# Patient Record
Sex: Female | Born: 1955 | Race: Black or African American | Hispanic: No | Marital: Married | Smoking: Former smoker
Health system: Southern US, Community
[De-identification: ages and names within clinical notes are randomized; demographics above are authoritative.]

## PROBLEM LIST (undated history)

## (undated) DIAGNOSIS — I351 Nonrheumatic aortic (valve) insufficiency: Secondary | ICD-10-CM

## (undated) DIAGNOSIS — K920 Hematemesis: Secondary | ICD-10-CM

## (undated) DIAGNOSIS — E785 Hyperlipidemia, unspecified: Secondary | ICD-10-CM

## (undated) DIAGNOSIS — N63 Unspecified lump in unspecified breast: Secondary | ICD-10-CM

## (undated) DIAGNOSIS — Z8041 Family history of malignant neoplasm of ovary: Secondary | ICD-10-CM

## (undated) DIAGNOSIS — M1990 Unspecified osteoarthritis, unspecified site: Secondary | ICD-10-CM

## (undated) DIAGNOSIS — N920 Excessive and frequent menstruation with regular cycle: Secondary | ICD-10-CM

## (undated) DIAGNOSIS — Z8042 Family history of malignant neoplasm of prostate: Secondary | ICD-10-CM

## (undated) DIAGNOSIS — J4489 Other specified chronic obstructive pulmonary disease: Secondary | ICD-10-CM

## (undated) DIAGNOSIS — I2510 Atherosclerotic heart disease of native coronary artery without angina pectoris: Secondary | ICD-10-CM

## (undated) DIAGNOSIS — I10 Essential (primary) hypertension: Secondary | ICD-10-CM

## (undated) DIAGNOSIS — J449 Chronic obstructive pulmonary disease, unspecified: Secondary | ICD-10-CM

## (undated) DIAGNOSIS — Z806 Family history of leukemia: Secondary | ICD-10-CM

## (undated) HISTORY — DX: Family history of malignant neoplasm of ovary: Z80.41

## (undated) HISTORY — DX: Essential (primary) hypertension: I10

## (undated) HISTORY — DX: Family history of malignant neoplasm of prostate: Z80.42

## (undated) HISTORY — DX: Nonrheumatic aortic (valve) insufficiency: I35.1

## (undated) HISTORY — DX: Family history of leukemia: Z80.6

## (undated) HISTORY — DX: Atherosclerotic heart disease of native coronary artery without angina pectoris: I25.10

## (undated) HISTORY — PX: OTHER SURGICAL HISTORY: SHX169

## (undated) HISTORY — DX: Hyperlipidemia, unspecified: E78.5

## (undated) HISTORY — DX: Other specified chronic obstructive pulmonary disease: J44.89

## (undated) HISTORY — DX: Excessive and frequent menstruation with regular cycle: N92.0

## (undated) HISTORY — DX: Chronic obstructive pulmonary disease, unspecified: J44.9

---
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
## 1998-01-23 ENCOUNTER — Encounter: Payer: Self-pay | Admitting: Emergency Medicine

## 1998-01-23 ENCOUNTER — Emergency Department (HOSPITAL_COMMUNITY): Admission: EM | Admit: 1998-01-23 | Discharge: 1998-01-23 | Payer: Self-pay | Admitting: Emergency Medicine

## 1998-01-25 ENCOUNTER — Ambulatory Visit (HOSPITAL_BASED_OUTPATIENT_CLINIC_OR_DEPARTMENT_OTHER): Admission: RE | Admit: 1998-01-25 | Discharge: 1998-01-25 | Payer: Self-pay | Admitting: Orthopedic Surgery

## 1998-11-16 ENCOUNTER — Emergency Department (HOSPITAL_COMMUNITY): Admission: EM | Admit: 1998-11-16 | Discharge: 1998-11-16 | Payer: Self-pay | Admitting: Emergency Medicine

## 1998-11-17 ENCOUNTER — Encounter: Payer: Self-pay | Admitting: Emergency Medicine

## 1998-11-17 ENCOUNTER — Emergency Department (HOSPITAL_COMMUNITY): Admission: EM | Admit: 1998-11-17 | Discharge: 1998-11-17 | Payer: Self-pay | Admitting: Emergency Medicine

## 1998-11-21 ENCOUNTER — Ambulatory Visit (HOSPITAL_COMMUNITY): Admission: RE | Admit: 1998-11-21 | Discharge: 1998-11-21 | Payer: Self-pay | Admitting: Internal Medicine

## 1998-11-21 ENCOUNTER — Encounter: Payer: Self-pay | Admitting: Internal Medicine

## 1999-02-19 HISTORY — PX: CORONARY ARTERY BYPASS GRAFT: SHX141

## 1999-09-10 ENCOUNTER — Encounter: Payer: Self-pay | Admitting: *Deleted

## 1999-09-10 ENCOUNTER — Inpatient Hospital Stay (HOSPITAL_COMMUNITY): Admission: EM | Admit: 1999-09-10 | Discharge: 1999-09-16 | Payer: Self-pay | Admitting: *Deleted

## 1999-09-11 ENCOUNTER — Encounter: Payer: Self-pay | Admitting: *Deleted

## 1999-09-12 ENCOUNTER — Encounter: Payer: Self-pay | Admitting: *Deleted

## 1999-09-13 ENCOUNTER — Encounter: Payer: Self-pay | Admitting: Surgery

## 1999-09-14 ENCOUNTER — Encounter: Payer: Self-pay | Admitting: Surgery

## 1999-10-16 ENCOUNTER — Encounter (HOSPITAL_COMMUNITY): Admission: RE | Admit: 1999-10-16 | Discharge: 2000-01-14 | Payer: Self-pay | Admitting: *Deleted

## 2000-10-13 ENCOUNTER — Other Ambulatory Visit: Admission: RE | Admit: 2000-10-13 | Discharge: 2000-10-13 | Payer: Self-pay | Admitting: Gynecology

## 2004-05-25 ENCOUNTER — Ambulatory Visit: Payer: Self-pay | Admitting: Internal Medicine

## 2005-03-12 ENCOUNTER — Ambulatory Visit: Payer: Self-pay | Admitting: Internal Medicine

## 2005-09-09 ENCOUNTER — Ambulatory Visit: Payer: Self-pay | Admitting: Internal Medicine

## 2005-10-10 ENCOUNTER — Ambulatory Visit: Payer: Self-pay | Admitting: Internal Medicine

## 2006-03-07 ENCOUNTER — Ambulatory Visit: Payer: Self-pay | Admitting: Internal Medicine

## 2006-03-07 LAB — CONVERTED CEMR LAB
ALT: 12 units/L (ref 0–40)
AST: 16 units/L (ref 0–37)
BUN: 7 mg/dL (ref 6–23)
Bilirubin Urine: NEGATIVE
CO2: 29 meq/L (ref 19–32)
Chloride: 107 meq/L (ref 96–112)
Creatinine, Ser: 1 mg/dL (ref 0.4–1.2)
Glucose, Bld: 95 mg/dL (ref 70–99)
HCT: 36.3 % (ref 36.0–46.0)
HDL: 44.9 mg/dL (ref >39.0)
Hemoglobin: 11.8 g/dL — ABNORMAL LOW (ref 12.0–15.0)
LDL Cholesterol: 112 mg/dL — ABNORMAL HIGH (ref 0–99)
MCHC: 32.6 g/dL (ref 30.0–36.0)
Monocytes Absolute: 0.7 10*3/uL (ref 0.2–0.7)
Monocytes Relative: 11.3 % — ABNORMAL HIGH (ref 3.0–11.0)
Platelets: 356 10*3/uL (ref 150–400)
Potassium: 3.9 meq/L (ref 3.5–5.1)
RBC: 4.1 M/uL (ref 3.87–5.11)
RDW: 12.1 % (ref 11.5–14.6)
Sodium: 141 meq/L (ref 135–145)
Specific Gravity, Urine: 1.025 (ref 1.000–1.03)
Total Bilirubin: 0.5 mg/dL (ref 0.3–1.2)
Total Protein: 7 g/dL (ref 6.0–8.3)
Triglycerides: 95 mg/dL (ref 0–149)
Urine Glucose: NEGATIVE mg/dL
pH: 5.5 (ref 5.0–8.0)

## 2006-03-26 ENCOUNTER — Ambulatory Visit: Payer: Self-pay | Admitting: Cardiology

## 2006-04-15 ENCOUNTER — Inpatient Hospital Stay (HOSPITAL_COMMUNITY): Admission: EM | Admit: 2006-04-15 | Discharge: 2006-04-16 | Payer: Self-pay | Admitting: Emergency Medicine

## 2006-04-15 ENCOUNTER — Ambulatory Visit: Payer: Self-pay | Admitting: Cardiology

## 2006-04-16 ENCOUNTER — Encounter: Payer: Self-pay | Admitting: Cardiology

## 2006-04-25 ENCOUNTER — Ambulatory Visit: Payer: Self-pay

## 2006-04-25 LAB — CONVERTED CEMR LAB
ALT: 20 units/L (ref 0–40)
Albumin: 2.7 g/dL — ABNORMAL LOW (ref 3.5–5.2)
Bilirubin, Direct: 0.1 mg/dL (ref 0.0–0.3)
LDL Cholesterol: 82 mg/dL (ref 0–99)

## 2006-04-29 ENCOUNTER — Ambulatory Visit: Payer: Self-pay | Admitting: Cardiology

## 2006-12-08 ENCOUNTER — Ambulatory Visit: Payer: Self-pay | Admitting: Cardiology

## 2007-01-20 ENCOUNTER — Other Ambulatory Visit: Admission: RE | Admit: 2007-01-20 | Discharge: 2007-01-20 | Payer: Self-pay | Admitting: Obstetrics and Gynecology

## 2007-01-22 ENCOUNTER — Encounter: Admission: RE | Admit: 2007-01-22 | Discharge: 2007-01-22 | Payer: Self-pay | Admitting: Obstetrics and Gynecology

## 2007-06-10 ENCOUNTER — Ambulatory Visit: Payer: Self-pay | Admitting: Cardiology

## 2007-06-10 LAB — CONVERTED CEMR LAB
AST: 19 units/L (ref 0–37)
Albumin: 3.4 g/dL — ABNORMAL LOW (ref 3.5–5.2)
Total CHOL/HDL Ratio: 3.5

## 2007-06-18 ENCOUNTER — Ambulatory Visit: Payer: Self-pay | Admitting: Internal Medicine

## 2007-06-18 DIAGNOSIS — J069 Acute upper respiratory infection, unspecified: Secondary | ICD-10-CM | POA: Insufficient documentation

## 2007-06-18 DIAGNOSIS — N920 Excessive and frequent menstruation with regular cycle: Secondary | ICD-10-CM

## 2007-06-19 DIAGNOSIS — J4489 Other specified chronic obstructive pulmonary disease: Secondary | ICD-10-CM | POA: Insufficient documentation

## 2007-06-19 DIAGNOSIS — E785 Hyperlipidemia, unspecified: Secondary | ICD-10-CM

## 2007-06-19 DIAGNOSIS — I10 Essential (primary) hypertension: Secondary | ICD-10-CM

## 2007-06-19 DIAGNOSIS — Z951 Presence of aortocoronary bypass graft: Secondary | ICD-10-CM

## 2007-06-19 DIAGNOSIS — J449 Chronic obstructive pulmonary disease, unspecified: Secondary | ICD-10-CM

## 2007-11-20 ENCOUNTER — Ambulatory Visit: Payer: Self-pay | Admitting: Internal Medicine

## 2007-11-20 DIAGNOSIS — R05 Cough: Secondary | ICD-10-CM

## 2007-12-01 ENCOUNTER — Ambulatory Visit: Payer: Self-pay | Admitting: Endocrinology

## 2007-12-01 ENCOUNTER — Encounter (INDEPENDENT_AMBULATORY_CARE_PROVIDER_SITE_OTHER): Payer: Self-pay | Admitting: *Deleted

## 2008-03-30 ENCOUNTER — Ambulatory Visit: Payer: Self-pay | Admitting: Cardiology

## 2008-04-22 ENCOUNTER — Ambulatory Visit: Payer: Self-pay

## 2008-04-22 ENCOUNTER — Ambulatory Visit: Payer: Self-pay | Admitting: Cardiology

## 2008-04-22 ENCOUNTER — Encounter: Payer: Self-pay | Admitting: Cardiology

## 2008-04-22 LAB — CONVERTED CEMR LAB
ALT: 16 units/L (ref 0–35)
Alkaline Phosphatase: 55 units/L (ref 39–117)
Cholesterol: 124 mg/dL (ref 0–200)
HDL: 45.4 mg/dL (ref >39.0)
Total Bilirubin: 0.8 mg/dL (ref 0.3–1.2)
Total CHOL/HDL Ratio: 2.7
VLDL: 13 mg/dL (ref 0–40)

## 2009-01-28 ENCOUNTER — Emergency Department (HOSPITAL_COMMUNITY): Admission: EM | Admit: 2009-01-28 | Discharge: 2009-01-29 | Payer: Self-pay | Admitting: Emergency Medicine

## 2010-05-22 LAB — COMPREHENSIVE METABOLIC PANEL
AST: 21 U/L (ref 0–37)
Albumin: 3.6 g/dL (ref 3.5–5.2)
Alkaline Phosphatase: 72 U/L (ref 39–117)
CO2: 27 mEq/L (ref 19–32)
Calcium: 9.7 mg/dL (ref 8.4–10.5)
Creatinine, Ser: 1.22 mg/dL — ABNORMAL HIGH (ref 0.4–1.2)
GFR calc non Af Amer: 46 mL/min — ABNORMAL LOW (ref >60)
Total Protein: 8 g/dL (ref 6.0–8.3)

## 2010-05-22 LAB — DIFFERENTIAL: Neutrophils Relative %: 79 % — ABNORMAL HIGH (ref 43–77)

## 2010-05-22 LAB — URINALYSIS, ROUTINE W REFLEX MICROSCOPIC
Bilirubin Urine: NEGATIVE
Specific Gravity, Urine: 1.014 (ref 1.005–1.030)

## 2010-05-22 LAB — CBC
Hemoglobin: 13.2 g/dL (ref 12.0–15.0)
MCHC: 33.6 g/dL (ref 30.0–36.0)
Platelets: 230 10*3/uL (ref 150–400)
RBC: 4.32 MIL/uL (ref 3.87–5.11)
RDW: 13.2 % (ref 11.5–15.5)
WBC: 14 10*3/uL — ABNORMAL HIGH (ref 4.0–10.5)

## 2010-05-22 LAB — URINE MICROSCOPIC-ADD ON

## 2010-07-03 NOTE — Assessment & Plan Note (Signed)
[facility]                            CARDIOLOGY OFFICE NOTE   HARMONY, SANDELL                    MRN:          [id number]  DATE:06/10/2007                            DOB:          December 23, 1955    Casey Huerta is a pleasant 55 year old female whom I have seen in the  past for coronary artery disease status post coronary bypass graft in  2001.  Her most recent Myoview was performed on April 25, 2006.  At that  time she was found to have ejection fraction of 63%.  There is no scar  ischemia.  Her most recent echocardiogram was performed on April 16, 2006.  Her ejection fraction was 50-55%.  There was mild aortic  insufficiency.  There is trivial tricuspid regurgitation.  Since I last  saw her she is having difficulties with menorrhagia.  She apparently has  seen her gynecologist, who has recommended hysterectomy secondary to  fibroids.  She would prefer not to do this and she is presently on  medication.  She has also had a chronic cough.  She states she does not  have a primary care physician.  She denies any dyspnea, chest pain,  palpitations, or syncope.  There is no pedal edema.   MEDICATIONS:  Include multivitamin daily, atenolol 25 mg daily, and  Pravachol 40 mg nightly.  She also takes aspirin daily.   PHYSICAL EXAM:  Today shows a blood pressure of 122/70 and pulse is 70.  She weighs 136 pounds.  HEENT:  Normal.  Neck is supple.  CHEST:  Clear.  CARDIOVASCULAR:  Regular rate.  ABDOMEN:  Exam shows no tenderness.  EXTREMITIES:  Show no edema.   Her electrocardiogram today shows a sinus rhythm at a rate of 70.  There  are nonspecific ST changes.   DIAGNOSES:  1. Coronary disease status post bypassing graft - [name]      appears to be doing well from a symptomatic standpoint.  She has      had no chest pain or shortness of breath and a Myoview      approximately a year ago was normal.  Will not pursue further      cardiac  workup.  If she does indeed require hysterectomy I think      she could proceed safely without further evaluation given the      above.  She will continue on her aspirin, beta blocker and statin.  2. Cough - we will arrange for her to have a primary care physician to      look into this further and her other primary medical needs.  3. Menorrhagia - this is managed per her OB/GYN.  4. Hyperemia - she will continue on Pravachol.  We will check lipids      and liver and adjust as indicated.  5. Hypertension - her blood pressure is well controlled on present      medications.  No history of emphysema.   We will see her back in 12 months.     Madolyn Frieze Jens Som, MD, [facility]  Electronically Signed  BSC/MedQ  DD: 06/10/2007  DT: 06/10/2007  Job #: [id number]

## 2010-07-03 NOTE — Assessment & Plan Note (Signed)
[facility]                            CARDIOLOGY OFFICE NOTE   Casey Huerta, Casey Huerta                    MRN:          [id number]  DATE:03/30/2008                            DOB:          June 04, 1955    Casey Huerta is a pleasant female who has a history of coronary artery  disease status post coronary bypassing graft in 2001.  Her last Myoview  in March 2008 showed an ejection fraction of 63% with no scar or  ischemia.  An echocardiogram on October 18, 1999, showed normal LV  function.  There was mild aortic insufficiency and mitral regurgitation.  Since I last saw her on June 10, 2007, she has done reasonably well.  She continues to have her cough.  She apparently has been seen by Dr.  Jonny Huerta for this.  Chest x-ray was performed on November 20, 2007 that showed  no active disease.  Pulmonary evaluation was recommended, but she  apparently had to change insurance coverage due to loss of employment by  her husband.  She is planning to see a pulmonologist soon.  She has not  had hemoptysis.  She has not had chest pain, shortness of breath, or  pedal edema.  There has been no syncope.   Her medications include:  1. Multivitamin daily.  2. Pravachol 40 mg p.o. daily.  3. Toprol 25 mg p.o. daily.  4. Aspirin 325 mg p.o. daily.   Her physical exam shows a blood pressure of 120/62 and her pulse is 63.  She weighs 136 pounds.  Her HEENT is normal.  Her neck is supple with no  bruits.  Her chest is clear.  Cardiovascular exam reveals a regular rate  and rhythm.  Abdominal exam shows no tenderness.  Extremities show no  edema.   Her electrocardiogram shows a sinus rhythm at a rate of 63.  The axis is  normal.  There are nonspecific ST changes.   DIAGNOSES:  1. Coronary artery disease status post coronary bypassing graft - [name] is doing well with no chest pain or shortness of breath.      We will continue with medical therapy including her  aspirin, beta-      blocker, and statin.  2. Cough - I have encouraged her to follow up with her primary care      physician concerning this issue.  3. Diastolic murmur on examination - we will plan to repeat her      echocardiogram to reassess her aortic insufficiency.  4. Menorrhagia - she continues to have problems with this, but she      does not want to proceed with a hysterectomy and she will follow up      with OB/GYN concerning this issue.  5. Hyperlipidemia - she will continue on her Pravachol, and we will      check lipids and liver to adjust with a goal LDL of less than 70.  6. Hypertension - her blood pressure is adequately controlled on her      present medications.  7. History of emphysema.  We will see her back in 12 months or sooner if necessary.  I encouraged  diet and exercise as well.  She does not smoke.     Madolyn Frieze Jens Som, MD, [facility]  Electronically Signed    BSC/MedQ  DD: 03/30/2008  DT: 03/31/2008  Job #: [id number]

## 2010-07-03 NOTE — Assessment & Plan Note (Signed)
[facility]                            CARDIOLOGY OFFICE NOTE   Casey Huerta, Casey Huerta                    MRN:          [id number]  DATE:12/08/2006                            DOB:          [birth year]    Casey Huerta is a very pleasant female who I follow for coronary artery  disease, status post coronary bypassing graft in 2001.  Her most recent  Myoview was performed on April 25, 2006.  Her ejection fraction was 63%.  There was no scar or ischemia.  Since she was last seen she is doing  reasonably well.  There is no dyspnea, chest pain, palpitations, syncope  or pedal edema.  She did discontinue her Zocor and her Toprol as she  felt it was causing myalgias.   Her present medications include aspirin daily as well as multivitamin.   Her physical exam today shows a blood pressure of 146/70 and her pulse  is 70.  She weighs 135 pounds.  HEENT:  Normal.  NECK:  Supple with no bruits.  CHEST:  Clear.  CARDIOVASCULAR:  A regular rate and rhythm.  ABDOMEN:  No tenderness.  EXTREMITIES:  No edema.   Her electrocardiogram today shows a sinus rhythm at a rate of 70.  The  axis is normal.  There are minor nonspecific ST changes.   DIAGNOSES:  1. Coronary artery disease, status post coronary bypassing graft.  [name] is doing well from a symptomatic standpoint with no chest      pain or shortness of breath.  Her recent Myoview was low-risk.  We      will continue with medical therapy including her aspirin and      multivitamin.  Of note, we will add Pravachol to see if she will      tolerate this and, if so, we will check lipids and liver in 6 weeks      and adjust as indicated.  I will also resume a beta blocker (will      give her atenolol 25 mg p.o. daily) for her elevated blood pressure      and her history of coronary disease.  2. Hyperlipidemia.  We will add a statin as described above.  3. Hypertension.  The atenolol should help with her  blood pressure.  4. History of mildly elevated liver functions, resolved on most recent      check.  5. History of emphysema.   We will see her back in 6 months.     Madolyn Frieze Jens Som, MD, [facility]  Electronically Signed    BSC/MedQ  DD: 12/08/2006  DT: 12/09/2006  Job #: [id number]

## 2010-07-06 NOTE — Discharge Summary (Signed)
NAMEMARCAYLA, Huerta             ACCOUNT NO.:  [id number]   MEDICAL RECORD NO.:  [id number]          PATIENT TYPE:  INP   LOCATION:  4713                         FACILITY:  [facility]   PHYSICIAN:  Madolyn Frieze. Jens Som, MD, FACCDATE OF BIRTH:  31-May-1955   DATE OF ADMISSION:  04/15/2006  DATE OF DISCHARGE:  04/16/2006                               DISCHARGE SUMMARY   CARDIOLOGIST:  Dr. Olga Millers.   PRIMARY CARE PHYSICIAN:  Dr. Oliver Barre.   REASON FOR ADMISSION:  Chest pain neck.   DISCHARGE DIAGNOSES:  1. Chest pain, etiology unclear.  2. Coronary disease status post coronary artery bypass graft in 2001      with a left internal mammary artery to left anterior descending      artery.  3. Good left ventricular function.  4. Elevated liver function tests, etiology unclear.  5. Hyperlipidemia.  6. Ex-smoker.  7. History of emphysema.  8. Status post arthroscopic knee surgery bilaterally.  9. History of cesarean section.  10.Family history of coronary artery disease.  11.Recent viral syndrome.  12.Mild aortic regurgitation.  13.Mild thickening mitral valve on echocardiogram with equivocal      prolapse.   HISTORY:  Casey Huerta is a 55 year old female patient with a history of  coronary disease status post CABG in 2001 who had recently seen Dr.  Jens Som with complaints of dyspnea.  She was set up for an outpatient  Myoview study for April 25, 2006.  She presented to the office on the day  of admission with complaints of viral-like syndrome.  She had some chest  pain as well that kept her up all night.  She was brought in for  observation for further evaluation.   HOSPITAL COURSE:  As noted above, she was brought in to [facility] for observation and further evaluation.  She ruled out for  myocardial infarction by enzymes.  Her amylase and lipase were both  normal.  Her AST was slightly elevated at 56 and ALT at 62.  Her  alkaline phosphatase was 134.  These were  noted to be normal on March 07, 2006.  Her D-dimer was elevated at 1.98.  She was sent for CT scan  to rule out pulmonary embolus.  This revealed no pulmonary emboli.  She  had mild left basilar atelectasis.  She had COPD and emphysema.  There  was mild cardiomegaly.  There was mild ectasia of the ascending aorta at  3.5 cm.  She was also sent for an abdominal ultrasound secondary to her  elevated LFTs.  This showed no acute findings.  She was noted to be in  stable condition after the above testing was completed.  Dr. Jens Som  felt that she was stable enough for discharge to home with plans for  outpatient stress testing and follow-up with him in the office.  The  patient will be taken off of her Crestor for now given her elevated  LFTs.  Her LFTs will be repeated in the next several weeks.  If they  remain elevated, then she will be referred for gastroenterology  evaluation.  She is discharged to home in stable condition.   LABORATORY AND ANCILLARY DATA:  White count 11,500, hemoglobin 11.9,  hematocrit 34.7, platelet count 459,000.  INR 1.1.  Sodium 135,  potassium 3.8, glucose 102, BUN 9, creatinine 1.08.  LFTs as noted  above.  Cardiac enzymes as noted above.  Chest x-ray:  Postoperative  changes related to CABG, right lower lobe calcified granuloma, no active  disease.  CT angiogram of the chest as noted above.  Abdominal  ultrasound as noted above.  A 2-D echocardiogram done April 16, 2006  secondary to murmur on exam revealing an EF of 50-55%.  No regional wall  motion abnormalities, mild aortic regurgitation, mild thickening of the  mitral valve with equivocal prolapse.  There was trivial mitral valve  regurgitation.   DISCHARGE MEDICATIONS:  1. Toprol XL 5 mg daily.  2. Aspirin 325 mg daily.  3. Multivitamin daily.  4. She has been advised to stay off Crestor for now.   She has been advised to return to work on April 21, 2006.   DIET:  Low-fat, low-sodium diet.    ACTIVITY:  She is to increase her activity slowly.   FOLLOW UP:  She has a stress Myoview scheduled for April 25, 2006 and  7:15 a.m.  She has been provided with instructions for that.  She also  has blood work scheduled for that day.  She will have follow-up with Dr.  Jens Som on April 29, 2006 at 4:30 p.m.  She should follow up with Dr.  Jonny Ruiz as scheduled.  Total physician PA time greater than 30 minutes.      Tereso Newcomer, PA-C      Madolyn Frieze. Jens Som, MD, [facility]  Electronically Signed    SW/MEDQ  D:  04/16/2006  T:  04/16/2006  Job:  [id number]   cc:   Madolyn Frieze. Jens Som, MD, [facility]  Corwin Levins, MD

## 2010-07-06 NOTE — Assessment & Plan Note (Signed)
[facility]                            CARDIOLOGY OFFICE NOTE   Casey Huerta, Casey Huerta                    MRN:          [id number]  DATE:03/26/2006                            DOB:          10/21/1955    Casey Huerta is a very pleasant 55 year old female with a past medical  history of coronary artery disease status post coronary artery bypass  graft, hyperlipidemia who we were asked to evaluate for dyspnea.  The  patient's cardiac history dates back to 2001 when she underwent cardiac  catheterization secondary to chest pain.  She was found to have a 90%  ostial LAD.  There was no other obstructive disease.  Her ejection  fraction was 45%.  She subsequently underwent coronary artery bypass  graft off-pump on September 08, 1999, by Evelene Croon, M.D.  Since that time  she has done reasonably well.  Her most recent nuclear study was  performed on March 11, 2002.  Her ejection fraction was 64%.  There  was no ischemia.  Note, she did have preoperative carotid Dopplers that  showed no obstructive disease.  She does describe dyspnea with more  moderate activities, but not with routine activities around the house.  There is no orthopnea, PND, pedal edema, palpitations, presyncope,  syncope, or exertional chest pain.   MEDICATIONS:  1. Crestor 20 mg p.o. daily.  2. Toprol 25 mg p.o. daily.  3. Aspirin 325 mg p.o. daily.  4. Multivitamin daily.   PAST MEDICAL HISTORY:  Significant for hyperlipidemia, but there is no  hypertension or diabetes mellitus by her report.  She has a history of  coronary artery disease as outlined in the HPI.  She has no other  medical problems noted.  She has had prior coronary artery bypass graft.  She has had a prior cesarean section.  She has also had arthroscopic  knee surgery bilaterally.  She also carries a diagnosis of COPD when  reviewing the previous chart.   SOCIAL HISTORY:  She does not smoke and has not for  approximately 8  years.  She does not consume alcohol.   FAMILY HISTORY:  Positive for coronary artery disease in her sister and  her mother.   REVIEW OF SYSTEMS:  She denies any headaches, fevers, or chills.  There  is no productive cough or hemoptysis.  There is no dysphagia,  odynophagia, melena, or hematochezia.  There is no dysuria, hematuria,  frequency, rash, or seizure activity.  There is no orthopnea, PND, or  pedal edema.  There is no claudication noted.  She does have problems  with menstrual cramps and also has some pain in her hands bilaterally  that she is seeing Dr. Jonny Ruiz for.  The remaining systems are negative.   PHYSICAL EXAMINATION:  VITAL SIGNS:  Blood pressure is 135/77, pulse is  65, she weighs 136 pounds.  GENERAL:  She is well-developed and well-nourished and in no acute  distress.  SKIN:  Warm and dry.  She does not appear to be depressed.  There is no  peripheral clubbing.  HEENT:  Unremarkable with normal eye lids.  NECK:  Supple with a normal upstroke bilaterally and cannot appreciate  bruits.  There is no jugular venous distention and no thyromegaly is  noted.  CHEST:  Clear to auscultation with normal expansion.  BACK:  Normal.  CARDIOVASCULAR:  Regular rate and rhythm with a normal S1 and S2.  There  is an S4.  I cannot appreciate an S3.  There are no murmurs noted.  Her  PMI is nondisplaced.  ABDOMEN:  Nontender, nondistended, positive bowel sounds, no  hepatosplenomegaly and no masses appreciated.  There is no abdominal  bruit.  Note, she does have previous sternotomy from her coronary artery  bypass graft.  She has 2+ femoral pulses bilaterally with no bruits.  EXTREMITIES:  No edema and I can palpate no cords.  She has 2+ dorsalis  pedis pulses bilaterally.  NEUROLOGY:  Grossly intact.   Her electrocardiogram shows sinus rhythm with nonspecific ST changes.   DIAGNOSIS:  1. Coronary artery disease, status post coronary artery bypass graft.  2.  Hyperlipidemia.  3. History of chronic obstructive pulmonary disease.   PLAN:  Ms. Blacksher is complaining of some dyspnea on exertion.  We will  plan to proceed with a Myoview for risk stratification.  If it shows  normal LV function and normal perfusion then we will continue with  medical therapy.  I will have her return for fasting lipids and liver  and we will adjust her medications with a goal LDL of less than 70.  We  discussed risk factor modification today including diet and exercise.  She has discontinued her tobacco use.  We will see her back in 12  months.     Madolyn Frieze Jens Som, MD, [facility]  Electronically Signed    BSC/MedQ  DD: 03/26/2006  DT: 03/26/2006  Job #: [id number]   cc:   Corwin Levins, MD

## 2010-07-06 NOTE — Assessment & Plan Note (Signed)
[facility]                            CARDIOLOGY OFFICE NOTE   RHETA, HEMMELGARN                    MRN:          [id number]  DATE:04/29/2006                            DOB:          May 28, 1955    Mrs. Plourde returns for followup today. She has a history of coronary  artery disease, status post coronary bypassing graft and most recently  admitted to [facility] with atypical chest pain and ruled out  for myocardial infarction with serial enzymes. She also had a viral type  syndrome. Note her liver functions were mildly elevated with an SGOT of  56, SGPT of 62, and an alkaline phosphatase of 134. Also her D-dimer was  elevated but a CT scan showed no pulmonary embolus although there was  COPD and emphysema. Following discharge she did have a Myoview performed  on April 25, 2006. Her ejection fraction was 63%. There was no scar or  ischemia and her septal wall motion was consistent with a prior CABG.  Since discharge, she denies any chest pain, abdominal pain, dyspnea, or  syncope. Her medications include;  1. Toprol 25 mg p.o. q.day.  2. Aspirin 325 mg p.o. q.day.  3. Multivitamin.   Her physical exam today shows a blood pressure of 137/78, and her pulse  is 76. She weighs 136 pounds.  CHEST: Clear.  CARDIOVASCULAR: Reveals a regular rate and rhythm.  EXTREMITIES: Show no edema.   DIAGNOSES:  1. Coronary artery disease, status post coronary artery bypassing      graft.  2. Recent atypical chest pain with negative nuclear study.  3. Recently elevated liver functions, now resolved.  4. Hyperlipidemia.  5. History of emphysema.   PLAN:  Mrs. Lapaglia is much improved since discharge. Her Myoview  showed no ischemia or infarction. We will therefore continue with  medical therapy. Note her liver functions were elevated when she was in  the hospital but she did have an abdominal ultrasound that was  unremarkable. This may have been  virally mediated. Note we had  discontinued her Crestor but we will resume a statin that she would  benefit from this longterm. I will begin Zocor 40 mg p.o. q.h.s. and we  will check lipids and liver in 6 weeks and adjust as indicated. We  discussed risk factor modification including diet and exercise. I will  see her back in 6 months.     Madolyn Frieze Jens Som, MD, [facility]  Electronically Signed    BSC/MedQ  DD: 04/29/2006  DT: 05/01/2006  Job #: [id number]

## 2010-07-06 NOTE — H&P (Signed)
[facility] ADMISSION   KAETLYN, NOA                    MRN:          [id number]  DATE:04/15/2006                            DOB:          [birth year]    PRIMARY CARE PHYSICIAN:  Corwin Levins, MD.   CHIEF COMPLAINT:  Chest pain.   HISTORY OF PRESENT ILLNESS:  This is a 55 year old African American  female patient with history of coronary artery disease, status post CABG  in 2001 by Dr. Laneta Simmers with a LIMA to the LAD.  She recently saw Dr.  Jens Som on March 26, 2006, when she was having some dyspnea on  exertion and he ordered stress Myoview which is to be performed on April 25, 2006.   Last week she had the flu and seemed to get over that but then started  vomiting yesterday.  Throughout the night she said she was up with chest  pain all night long and could not sleep.  She describes it as sharp,  shooting, stabbing pains and she just hurts to take deep breaths.  This  morning, it is also sharp-shooting.  She said before she had her bypass  surgery, her EKGs were normal and she is just concerned that this is  coming from her heart.  She denies any chest heaviness, pressure,  shortness of breath, dizziness or presyncope.  She is able to keep  liquids down and some fruit but she did vomit this morning.   ALLERGIES:  NO KNOWN DRUG ALLERGIES.  She is sensitive to generic Zocor.   CURRENT MEDICATIONS:  1. Crestor 20 mg daily.  2. Toprol XL 25 mg daily.  3. Ecotrin 325 mg daily.  4. Multivitamin daily.   PAST MEDICAL HISTORY:  1. Hyperlipidemia.  2. History of emphysema.  Quit smoking 10-12 years ago.  3. Arthroscopic knee surgery bilaterally.  4. Prior C. Section.   SOCIAL HISTORY:  She is married.  She has three children, six  grandchildren.  She quit smoking 10-12 years ago.   FAMILY HISTORY:  Her mother and sister both have bypass surgery.   REVIEW OF SYSTEMS:  She did have flu  recently with fever, chills and  aching.  This has resolved but she has had the vomiting for the past day  and a half.  CARDIOPULMONARY:  Please see HPI.   PHYSICAL EXAMINATION:  GENERAL APPEARANCE:  This is an anxious 50-year-  old African American female complaining of chest pain.  VITAL SIGNS:  Blood pressure 132/66, pulse 82, weight 133.  HEENT:  Head is normocephalic, atraumatic.  Extraocular movements  intact.  Pupils are equal, round, reactive to light and accommodation.  Nasal mucosa is moist.  Serosa without erythema or exudate.  NECK:  Without JVD, HJR, bruit or thyroid enlargement.  LUNGS:  Decreased breath sounds but clear anterior, posterior and  lateral.  CARDIOVASCULAR:  Regular rate and rhythm at 85 beats per minute.  Normal  S1 and S2.  Positive S4 with 1/6 systolic murmur at the left sternal  border.  ABDOMEN:  Soft without organomegaly, masses,  lesions or abnormal  tenderness.  EXTREMITIES:  Without clubbing, cyanosis, or edema.  She has good distal  pulses.  NEUROLOGIC:  Without focal deficit.   EKG:  Normal sinus rhythm, no acute change.   IMPRESSION:  1. Chest pain, rule out cardiac ischemia.  2. Coronary artery disease status post coronary artery bypass grafting      x1 in 2001 by Dr. Laneta Simmers off pump, left internal mammary artery to      the left anterior descending.  3. Hyperlipidemia.  4. Prior emphysema, quit smoking 10 years ago.  5. Family history of coronary artery disease.  6. Recent flu and gastrointestinal virus.   PLAN:  At this time we will admit the patient for 24-hour observation,  order an echo with her systolic murmur and history of mild AI as well as  mild MR back in 2001 and she is already scheduled for a stress Myoview  April 25, 2006, which we could move up if needed.      Jacolyn Reedy, PA-C  Electronically Signed      Rollene Rotunda, MD, [facility]  Electronically Signed   ML/MedQ  DD: 04/15/2006  DT: 04/15/2006  Job #: [id number]

## 2010-07-06 NOTE — Discharge Summary (Signed)
[place]. [facility]  Patient:    Casey Huerta, Casey Huerta                    MRN: [id number] Adm. Date:  [id number] Disc. Date: 09/16/99 Attending:  Veneda Melter Dictator:   Sherrie George, P.A. CC:         Alleen Borne, M.D.             Veneda Melter, M.D. LHC                           Discharge Summary  DATE OF BIRTH:  11-21-1955.  ADMITTING DIAGNOSIS:  Angina, rule out myocardial infarction.  DISCHARGE DIAGNOSES: 1. Single vessel 90% left anterior descending coronary artery stenosis with    unstable angina. 2. Emphysema by CT scan. 3. Postoperative anemia with hemoglobin up to 7.8.  PROCEDURES: 1. Cardiac catheterization September 11, 1999. 2. Off pump coronary artery bypass grafting x 1, September 12, 1999 by Dr. Alleen Borne.  BRIEF HISTORY:  The patient is a 55 year old black female who presented to the emergency room at [facility] with a one-week history of chest pain.  The pain was midsternal.  She said it was very intense 20/10. She denied nausea, vomiting, diaphoresis, shortness of breath, or radiation.  She was seen in evaluation by the cardiology service and she was noted to have some T-wave inversions.  She was admitted for observation to rule out myocardial infarction.  REVIEW OF SYSTEMS:  Noncontributory.  PAST MEDICAL HISTORY:  She denied any past medical history.  MEDICATIONS: 1. Prevacid 30 mg q.d. 2. Vicodin 1-2 p.o. q.4-6h. p.r.n.  SOCIAL HISTORY:  She denied a history of alcohol, tobacco, or drug use.  She is married and has children.  For further history and physical, please see the dictated note.  HOSPITAL COURSE:  The patient was admitted, placed on observation, and IV heparin.  CPKs were minimally positive.  Troponin was minimally positive. There was some concern at this point to be due from gallbladder disease.  She is subsequently recommended to undergo treadmill and ultimately underwent cardiac  catheterization on September 14, 1999.  This showed a 90% ostial stenosis of the left anterior descending coronary artery.  The circumflex, the RCA, and the left main were normal.  There was significant anteroapical wall motion abnormality.  The ejection fraction was 40-45% and no thrombus was seen.  The patient was referred to CVTS and Dr. Alleen Borne.  He noted the patient had continued chest discomfort, borderline CPKs and MBs, T-wave inversion.  Checked the lipase to rule out pancreatitis but agreed coronary artery bypass graft was the best choice for the patients treatment.  Doppler studies were obtained and there was no evidence of ICA stenosis. There was antegrade vertebral flow.  No evidence of obvious plaque.  The patient had palpable pulses bilaterally.  After reviewing the studies, he recommended coronary artery bypass grafting. This was performed on September 12, 1999 using the off pump technique.  Coronary artery bypass grafting was the left internal mammary artery to the LAD.  The patient tolerated the procedure well.  She was transferred to the intensive care unit and then was transferred to the floor.  The patient has made good progress.  She has had no arrhythmias.  She had some nausea which has resolved on September 15, 1999.  She has postoperative anemia with hemoglobin  down to 7.8.  She received one unit of packed cells.  She brought her hemoglobin up to 8.8 with a hematocrit of 24.9.  Platelets were 180,000. White count is 9.2.  Sodium is 140, potassium is 3.9, chloride is 108, CO2 is 27, BUN 7, creatinine 1, glucose is 95.  At this point, she is ambulating and tolerating p.o.s well.  If she continues without any problems, we will anticipate discharge home in the morning, September 16, 1999.  DISCHARGE MEDICATIONS: 1. Toprol XL 25 mg 1 q.d. 2. Zocor 20 mg p.o. h.s. 3. Ecotrin 325 mg 1 q.d. 4. Darvocet-N 100 1-2 p.o. q.4h. p.r.n. 5. Multivitamin with iron 1 q.d. 6. Prevacid 1  p.o. q.d. (preadmission medicine).  DISCHARGE INSTRUCTIONS:  She will return in two weeks to see Dr. Veneda Melter, three weeks to see Dr. Alleen Borne.  Her sutures and staples can be removed prior to discharge.  CONDITION ON DISCHARGE:  Improved. DD:  09/15/99 TD:  09/16/99 Job: [id number]

## 2010-07-06 NOTE — Op Note (Signed)
[place]. [facility]  Patient:    Casey Huerta, Casey Huerta                    MRN: [id number] Proc. Date: 09/12/99 Adm. Date:  [id number] Attending:  Veneda Melter                           Operative Report  PREOPERATIVE DIAGNOSIS:  Severe single vessel coronary artery disease with unstable angina.  POSTOPERATIVE DIAGNOSIS:  Severe single vessel coronary artery disease with unstable angina.  OPERATIVE PROCEDURE:  Median sternotomy, off pump coronary artery bypass graft surgery times one using a left internal mammary artery graft to the left anterior descending coronary artery.  ATTENDING SURGEON:  Dr. Evelene Croon.  ASSISTANT:  Dr. Tressie Stalker.  ANESTHESIA:  General endotracheal.  CLINICAL HISTORY:  This patient is a 55 year old black female with recent onset of unstable anginal symptoms.  She ruled out for myocardial infarction. Electrocardiogram showed anterolateral ST and T-wave changes.  Cardiac catheterization showed an ostial 90% LAD stenosis with some haziness.  There is no other coronary disease.  After review of the angiograms and examination of the patient, it was felt that coronary artery bypass surgery using a left internal mammary graft to the LAD would be the best treatment.  I discussed the operative procedure with her and her sisters, including the possibility of doing off-pump coronary artery bypass surgery, alternatives, benefits, and risks, including bleeding, possible blood transfusion, infection, stroke, myocardial infarction, and death.  They understood and agreed to proceed. PROCEDURE IN DETAIL:  The patient was taken to the operating room and placed on the table in the supine position.  After induction of general endotracheal anesthesia, a Foley catheter was placed in the bladder using sterile technique.  Then, the chest, abdomen, and both lower extremities were prepped and draped in the usual sterile manner.  The chest was entered  through a median sternotomy incision and the pericardium opened in the midline.  Examination of the heart showed good ventricular contractility.  The ascending aorta had no palpable plaques in it.  Then, the left internal mammary artery was harvested from the chest wall as a pedicle graft.  This was a medium caliber vessel with good blood flow through it.  Then, an opening was made in the left pericardium anterior to the phrenic nerve and the mammary artery passed through into the pericardium.  The off-pump coronary bypass retractor was placed.  A lap pad was placed behind the heart for support to expose the LAD artery.  The patient tolerated this well and remained hemodynamically stable.  The stabilization bar was then measured and Silastic tapes were placed around the site of anastomosis proximally and distally.  The stabilizer bar was put in place.  There was good stabilization of the area of anastomosis.  Then, an arteriotomy was made in the LAD.  The internal diameter was about 2 mm.  The Silastic tapes were pulled up tight and there was reasonable hemostasis.  Then, the distal anastomosis of the left mammary graft to the LAD was performed in an end-to-side manner using continuous 8-0 Prolene suture. The tapes were then relaxed and the clamp removed from the mammary pedicle. The anastomosis was hemostatic.  The mammary pedicle was tacked to the epicardium with 6-0 Prolene sutures.  The stabilization bar was then removed. The patient was given Protamine to reverse the Heparin effect.  There was good hemostasis.  Cardiac function appeared excellent with a cardiac output of 4 liters a minute.  The patient had no arrhythmias and remained completely hemodynamically stable.  Then, three chest tubes were placed with two in the posterior pericardium, one in the left pleural space and one in the anterior mediastinum.  The pericardium was reapproximated over the heart.  The sternum was  closed with #6 stainless steel wires.  The fascia was closed with continuous #1 Vicryl suture.  Subcutaneous tissue was closed using continuous #2-0 Vicryl and the skin with 3-0 Vicryl subcuticular closure.  The sponge, needle and instrument counts were correct according to the scrub nurse.  Dry sterile dressings were applied over the incision and around the chest tubes.  The patient remained hemodynamically stable and was transported to the SICU in guarded but stable condition. DD:  09/12/99 TD:  09/13/99 Job: [id number]

## 2010-07-06 NOTE — Procedures (Signed)
[name]  Patient:    Casey Huerta, Casey Huerta                    MRN: [id number] Proc. Date: 09/11/99 Adm. Date:  [id number] Attending:  Veneda Melter CC:         Dr. Chales Abrahams                           Procedure Report  PROCEDURE: Coronary angiography.  INDICATIONS FOR PROCEDURE: Significant chest pain with electrocardiogram changes.  CARDIOLOGIST: Noralyn Pick. Eden Emms, M.D.  FINDINGS: The left main coronary artery was normal.  The ostial left anterior descending artery had a 90% eccentric lesion.  This was documented in multiple views.  The mid and distal vessel were normal.  The circumflex coronary artery was nondominant and was normal.  The right coronary artery was dominant and was normal.  RAO ventriculography reveals significant anterior apical and inferior apical wall motion abnormalities.  Ejection fraction was in the 45% range.  There was no gradient across the aortic valve and no MR.  Aortic pressure was 109/61, LV pressure was 109/10.  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  Films were reviewed with Dr. Riley Kill.  The patients left anterior descending lesion is too close to the left main to intervene on and she will need an off-pump internal mammary artery to the left anterior descending for her best long-term prognosis.  I suspect that she has some stunned myocardium and hopefully this will improve after bypass. DD:  09/11/99 TD:  09/12/99 Job: [id number]

## 2010-10-23 ENCOUNTER — Other Ambulatory Visit: Payer: Self-pay | Admitting: Cardiology

## 2010-11-22 ENCOUNTER — Other Ambulatory Visit: Payer: Self-pay | Admitting: Cardiology

## 2011-01-01 ENCOUNTER — Other Ambulatory Visit: Payer: Self-pay | Admitting: Cardiology

## 2012-01-21 ENCOUNTER — Other Ambulatory Visit: Payer: Self-pay | Admitting: Cardiology

## 2012-02-28 ENCOUNTER — Other Ambulatory Visit: Payer: Self-pay | Admitting: Cardiology

## 2012-03-04 ENCOUNTER — Encounter: Payer: Self-pay | Admitting: Cardiology

## 2012-03-04 ENCOUNTER — Encounter: Payer: Self-pay | Admitting: *Deleted

## 2012-03-06 ENCOUNTER — Encounter: Payer: Self-pay | Admitting: Cardiology

## 2012-03-06 ENCOUNTER — Ambulatory Visit (INDEPENDENT_AMBULATORY_CARE_PROVIDER_SITE_OTHER): Payer: Managed Care, Other (non HMO) | Admitting: Cardiology

## 2012-03-06 VITALS — BP 130/62 | HR 57 | Resp 18 | Ht 62.0 in | Wt 140.0 lb

## 2012-03-06 DIAGNOSIS — I10 Essential (primary) hypertension: Secondary | ICD-10-CM

## 2012-03-06 DIAGNOSIS — E785 Hyperlipidemia, unspecified: Secondary | ICD-10-CM

## 2012-03-06 DIAGNOSIS — I2510 Atherosclerotic heart disease of native coronary artery without angina pectoris: Secondary | ICD-10-CM

## 2012-03-06 DIAGNOSIS — R079 Chest pain, unspecified: Secondary | ICD-10-CM | POA: Insufficient documentation

## 2012-03-06 DIAGNOSIS — I359 Nonrheumatic aortic valve disorder, unspecified: Secondary | ICD-10-CM

## 2012-03-06 DIAGNOSIS — I351 Nonrheumatic aortic (valve) insufficiency: Secondary | ICD-10-CM

## 2012-03-06 MED ORDER — ATORVASTATIN CALCIUM 80 MG PO TABS: 80.0000 mg | ORAL_TABLET | Freq: Every day | ORAL | Status: DC

## 2012-03-06 MED ORDER — PRAVASTATIN SODIUM 40 MG PO TABS: 40.0000 mg | ORAL_TABLET | Freq: Every evening | ORAL | Status: DC

## 2012-03-06 NOTE — Assessment & Plan Note (Signed)
Add lipitor 80 mg daily. Lipids and liver in 6 weeks.

## 2012-03-06 NOTE — Patient Instructions (Addendum)
Your physician wants you to follow-up in: ONE YEAR WITH DR Shelda Pal will receive a reminder letter in the mail two months in advance. If you don't receive a letter, please call our office to schedule the follow-up appointment.   Your physician has requested that you have en exercise stress myoview. For further information please visit [email or web address] Please follow instruction sheet, as given.   Your physician has requested that you have an echocardiogram. Echocardiography is a painless test that uses sound waves to create images of your heart. It provides your doctor with information about the size and shape of your heart and how well your heart's chambers and valves are working. This procedure takes approximately one hour. There are no restrictions for this procedure.   START LIPITOR 80 MG ONCE DAILY  Your physician recommends that you return for lab work in: 6 WEEKS FASTING  REFERRAL TO ANOTHER PRIMARY BESIDES DR Jonny Ruiz

## 2012-03-06 NOTE — Assessment & Plan Note (Signed)
Blood pressure controlled - Continue atenolol 

## 2012-03-06 NOTE — Assessment & Plan Note (Signed)
Repeat echocardiogram. 

## 2012-03-06 NOTE — Assessment & Plan Note (Signed)
Continue aspirin. Resume statin. 

## 2012-03-06 NOTE — Progress Notes (Signed)
   HPI: pleasant female for evaluation of coronary artery disease. She is status post coronary artery bypass and graft in 2001. Last Myoview in March of 2008 showed an ejection fraction of 63% and no ischemia. Abdominal ultrasound in February 2008 showed no aneurysm. Echocardiogram in March of 2010 showed normal LV function, mild to moderate aortic insufficiency and mild mitral regurgitation. I last saw her in February 2010. Since that time over the last several years she has had occasional chest pain. It is substernal lasting seconds. It is described as "pain". No radiation or associated symptoms. It is not exertional. She has not had dyspnea on exertion, orthopnea, pedal edema, syncope or exertional chest pain.  Current Outpatient Prescriptions  Medication Sig Dispense Refill  . aspirin 81 MG tablet Take 81 mg by mouth daily.      Marland Kitchen atenolol (TENORMIN) 25 MG tablet TAKE ONE TABLET BY MOUTH IN THE MORNING  30 tablet  0  . Multiple Vitamin (MULTIVITAMIN WITH MINERALS) TABS Take 1 tablet by mouth daily.         Past Medical History  Diagnosis Date  . HYPERLIPIDEMIA   . HYPERTENSION   . CORONARY ARTERY DISEASE   . COPD   . MENORRHAGIA   . Aortic insufficiency     No past surgical history on file.  History   Social History  . Marital Status: Married    Spouse Name: N/A    Number of Children: N/A  . Years of Education: N/A   Occupational History  . Not on file.   Social History Main Topics  . Smoking status: Never Smoker   . Smokeless tobacco: Not on file  . Alcohol Use: Not on file  . Drug Use: Not on file  . Sexually Active: Not on file   Other Topics Concern  . Not on file   Social History Narrative  . No narrative on file    ROS: knee arthralgias but no fevers or chills, productive cough, hemoptysis, dysphasia, odynophagia, melena, hematochezia, dysuria, hematuria, rash, seizure activity, orthopnea, PND, pedal edema, claudication. Remaining systems are  negative.  Physical Exam: Well-developed well-nourished in no acute distress.  Skin is warm and dry.  Patient does not appear to be depressed; no peripheral clubbing Back - normal HEENT is normal with normal eyelids Neck is supple. No bruits. No thyromegaly. Chest is clear to auscultation with normal expansion.  Cardiovascular exam is regular rate and rhythm. 2/6 diastolic murmur left sternal border. Abdominal exam nontender or distended. No masses palpated. Positive bowel sounds. No hepatosplenomegaly  2+ femoral pulses Extremities show no edema; 2+ DP neuro grossly intact  ECG sinus rhythm at a rate of 74. Anterior T wave inversion.

## 2012-03-06 NOTE — Assessment & Plan Note (Signed)
Symptoms atypical. Schedule myoview for risk stratification.

## 2012-03-17 ENCOUNTER — Encounter: Payer: Self-pay | Admitting: Cardiology

## 2012-03-23 ENCOUNTER — Encounter (HOSPITAL_COMMUNITY): Payer: Managed Care, Other (non HMO)

## 2012-03-27 ENCOUNTER — Other Ambulatory Visit (HOSPITAL_COMMUNITY): Payer: Managed Care, Other (non HMO)

## 2012-03-30 ENCOUNTER — Ambulatory Visit: Payer: Managed Care, Other (non HMO) | Admitting: Internal Medicine

## 2012-03-30 DIAGNOSIS — Z0289 Encounter for other administrative examinations: Secondary | ICD-10-CM

## 2012-04-13 ENCOUNTER — Other Ambulatory Visit: Payer: Managed Care, Other (non HMO)

## 2012-04-14 ENCOUNTER — Other Ambulatory Visit: Payer: Self-pay | Admitting: Cardiology

## 2012-06-12 ENCOUNTER — Other Ambulatory Visit: Payer: Self-pay | Admitting: Cardiology

## 2012-07-17 ENCOUNTER — Other Ambulatory Visit: Payer: Self-pay | Admitting: Cardiology

## 2012-08-17 ENCOUNTER — Other Ambulatory Visit: Payer: Self-pay | Admitting: Cardiology

## 2012-09-23 ENCOUNTER — Other Ambulatory Visit: Payer: Self-pay | Admitting: Cardiology

## 2012-11-05 ENCOUNTER — Other Ambulatory Visit: Payer: Self-pay | Admitting: Cardiology

## 2012-12-13 ENCOUNTER — Other Ambulatory Visit: Payer: Self-pay | Admitting: Cardiology

## 2013-02-16 ENCOUNTER — Other Ambulatory Visit: Payer: Self-pay | Admitting: Cardiology

## 2013-03-29 ENCOUNTER — Other Ambulatory Visit: Payer: Self-pay | Admitting: Cardiology

## 2013-10-11 ENCOUNTER — Ambulatory Visit (INDEPENDENT_AMBULATORY_CARE_PROVIDER_SITE_OTHER): Payer: Managed Care, Other (non HMO) | Admitting: Cardiology

## 2013-10-11 ENCOUNTER — Encounter: Payer: Self-pay | Admitting: Cardiology

## 2013-10-11 ENCOUNTER — Observation Stay (HOSPITAL_COMMUNITY)
Admission: AD | Admit: 2013-10-11 | Discharge: 2013-10-13 | Disposition: A | Discharge status: Home / Self Care | Payer: Managed Care, Other (non HMO) | Source: Ambulatory Visit | Attending: Cardiology | Admitting: Cardiology

## 2013-10-11 ENCOUNTER — Encounter (HOSPITAL_COMMUNITY): Payer: Self-pay | Admitting: Pharmacy Technician

## 2013-10-11 VITALS — BP 160/74 | HR 76 | Ht 62.0 in | Wt 145.0 lb

## 2013-10-11 DIAGNOSIS — I351 Nonrheumatic aortic (valve) insufficiency: Secondary | ICD-10-CM | POA: Diagnosis present

## 2013-10-11 DIAGNOSIS — Z23 Encounter for immunization: Secondary | ICD-10-CM | POA: Diagnosis not present

## 2013-10-11 DIAGNOSIS — R079 Chest pain, unspecified: Secondary | ICD-10-CM

## 2013-10-11 DIAGNOSIS — E785 Hyperlipidemia, unspecified: Secondary | ICD-10-CM | POA: Diagnosis present

## 2013-10-11 DIAGNOSIS — I2510 Atherosclerotic heart disease of native coronary artery without angina pectoris: Secondary | ICD-10-CM | POA: Diagnosis not present

## 2013-10-11 DIAGNOSIS — Z951 Presence of aortocoronary bypass graft: Secondary | ICD-10-CM | POA: Diagnosis present

## 2013-10-11 DIAGNOSIS — Z7982 Long term (current) use of aspirin: Secondary | ICD-10-CM | POA: Insufficient documentation

## 2013-10-11 DIAGNOSIS — I10 Essential (primary) hypertension: Secondary | ICD-10-CM | POA: Diagnosis present

## 2013-10-11 DIAGNOSIS — J4489 Other specified chronic obstructive pulmonary disease: Secondary | ICD-10-CM | POA: Diagnosis present

## 2013-10-11 DIAGNOSIS — J449 Chronic obstructive pulmonary disease, unspecified: Secondary | ICD-10-CM | POA: Diagnosis present

## 2013-10-11 DIAGNOSIS — I359 Nonrheumatic aortic valve disorder, unspecified: Secondary | ICD-10-CM | POA: Insufficient documentation

## 2013-10-11 DIAGNOSIS — Z87891 Personal history of nicotine dependence: Secondary | ICD-10-CM | POA: Insufficient documentation

## 2013-10-11 LAB — CBC WITH DIFFERENTIAL/PLATELET
BASOS ABS: 0 10*3/uL (ref 0.0–0.1)
Basophils Relative: 1 % (ref 0–1)
EOS PCT: 3 % (ref 0–5)
Eosinophils Absolute: 0.2 10*3/uL (ref 0.0–0.7)
HEMATOCRIT: 37 % (ref 36.0–46.0)
Hemoglobin: 12.4 g/dL (ref 12.0–15.0)
LYMPHS ABS: 2.2 10*3/uL (ref 0.7–4.0)
LYMPHS PCT: 40 % (ref 12–46)
MCH: 29.7 pg (ref 26.0–34.0)
MCHC: 33.5 g/dL (ref 30.0–36.0)
MCV: 88.5 fL (ref 78.0–100.0)
Monocytes Absolute: 0.6 10*3/uL (ref 0.1–1.0)
Monocytes Relative: 11 % (ref 3–12)
NEUTROS ABS: 2.5 10*3/uL (ref 1.7–7.7)
Neutrophils Relative %: 45 % (ref 43–77)
Platelets: 217 10*3/uL (ref 150–400)
RBC: 4.18 MIL/uL (ref 3.87–5.11)
RDW: 12.1 % (ref 11.5–15.5)
WBC: 5.6 10*3/uL (ref 4.0–10.5)

## 2013-10-11 LAB — COMPREHENSIVE METABOLIC PANEL
ALBUMIN: 3.4 g/dL — AB (ref 3.5–5.2)
ALK PHOS: 77 U/L (ref 39–117)
ALT: 16 U/L (ref 0–35)
AST: 18 U/L (ref 0–37)
Anion gap: 12 (ref 5–15)
BUN: 13 mg/dL (ref 6–23)
CO2: 25 mEq/L (ref 19–32)
Calcium: 9.3 mg/dL (ref 8.4–10.5)
Chloride: 104 mEq/L (ref 96–112)
Creatinine, Ser: 1.06 mg/dL (ref 0.50–1.10)
GFR calc Af Amer: 66 mL/min — ABNORMAL LOW (ref >90)
GFR calc non Af Amer: 57 mL/min — ABNORMAL LOW (ref >90)
Glucose, Bld: 141 mg/dL — ABNORMAL HIGH (ref 70–99)
POTASSIUM: 3.5 meq/L — AB (ref 3.7–5.3)
Sodium: 141 mEq/L (ref 137–147)
TOTAL PROTEIN: 6.8 g/dL (ref 6.0–8.3)
Total Bilirubin: 0.2 mg/dL — ABNORMAL LOW (ref 0.3–1.2)

## 2013-10-11 MED ORDER — SODIUM CHLORIDE 0.9 % IV SOLN: 250.0000 mL | INTRAVENOUS | Status: DC | PRN

## 2013-10-11 MED ORDER — ASPIRIN 81 MG PO CHEW: 324.0000 mg | CHEWABLE_TABLET | ORAL | Status: AC

## 2013-10-11 MED ORDER — NITROGLYCERIN 0.4 MG SL SUBL
0.4000 mg | SUBLINGUAL_TABLET | SUBLINGUAL | Status: DC | PRN
Start: 2013-10-11 — End: 2013-10-13
  Administered 2013-10-11 (×2): 0.4 mg via SUBLINGUAL

## 2013-10-11 MED ORDER — SODIUM CHLORIDE 0.9 % IJ SOLN: 3.0000 mL | Freq: Two times a day (BID) | INTRAMUSCULAR | Status: DC

## 2013-10-11 MED ORDER — ACETAMINOPHEN 325 MG PO TABS
650.0000 mg | ORAL_TABLET | ORAL | Status: DC | PRN
  Administered 2013-10-11 – 2013-10-13 (×4): 650 mg via ORAL
  Filled 2013-10-11 (×4): qty 2

## 2013-10-11 MED ORDER — SODIUM CHLORIDE 0.9 % IV SOLN
INTRAVENOUS | Status: DC
  Administered 2013-10-12 (×2): via INTRAVENOUS

## 2013-10-11 MED ORDER — ASPIRIN 300 MG RE SUPP: 300.0000 mg | RECTAL | Status: AC

## 2013-10-11 MED ORDER — HEPARIN (PORCINE) IN NACL 100-0.45 UNIT/ML-% IJ SOLN
800.0000 [IU]/h | INTRAMUSCULAR | Status: DC
  Administered 2013-10-11: 750 [IU]/h via INTRAVENOUS
  Filled 2013-10-11 (×2): qty 250

## 2013-10-11 MED ORDER — CARVEDILOL 3.125 MG PO TABS
3.1250 mg | ORAL_TABLET | Freq: Two times a day (BID) | ORAL | Status: DC
  Administered 2013-10-11 – 2013-10-12 (×2): 3.125 mg via ORAL
  Filled 2013-10-11 (×4): qty 1

## 2013-10-11 MED ORDER — HEPARIN BOLUS VIA INFUSION
3000.0000 [IU] | Freq: Once | INTRAVENOUS | Status: AC
Start: 2013-10-11 — End: 2013-10-11
  Administered 2013-10-11: 3000 [IU] via INTRAVENOUS
  Filled 2013-10-11: qty 3000

## 2013-10-11 MED ORDER — ATORVASTATIN CALCIUM 40 MG PO TABS
40.0000 mg | ORAL_TABLET | Freq: Every day | ORAL | Status: DC
  Administered 2013-10-11 – 2013-10-12 (×2): 40 mg via ORAL
  Filled 2013-10-11 (×3): qty 1

## 2013-10-11 MED ORDER — ASPIRIN 81 MG PO CHEW
81.0000 mg | CHEWABLE_TABLET | ORAL | Status: AC
  Administered 2013-10-12: 81 mg via ORAL

## 2013-10-11 MED ORDER — SODIUM CHLORIDE 0.9 % IJ SOLN: 3.0000 mL | INTRAMUSCULAR | Status: DC | PRN

## 2013-10-11 MED ORDER — ONDANSETRON HCL 4 MG/2ML IJ SOLN
4.0000 mg | Freq: Four times a day (QID) | INTRAMUSCULAR | Status: DC | PRN
  Administered 2013-10-13: 4 mg via INTRAVENOUS
  Filled 2013-10-11: qty 2

## 2013-10-11 MED ORDER — NITROGLYCERIN 0.4 MG SL SUBL: 0.4000 mg | SUBLINGUAL_TABLET | SUBLINGUAL | Status: DC | PRN

## 2013-10-11 MED ORDER — ASPIRIN 81 MG PO CHEW
81.0000 mg | CHEWABLE_TABLET | Freq: Every day | ORAL | Status: DC
  Filled 2013-10-11: qty 1

## 2013-10-11 NOTE — Progress Notes (Signed)
10/11/2013 Casey Huerta   Jun 21, 1955  [id number]  Primary Physicia No PCP Per Patient Primary Cardiologist: Dr. Stanford Breed  HPI:  The patient is is a 58 year old AA female who presents to clinic today for routine evaluation. She is followed by Dr. Stanford Breed. She has a known history of CAD, status post CABG x1 with a LIMA to the LAD in 2001 by Dr. Cyndia Bent. Her last LHC was in March 2008 which showed an ejection fraction of 63% and no ischemia.  Her last office visit with Dr. Stanford Breed was in Jan 2013. At that time, he ordered for her to undergo repeat nuclear stress testing. He also instructed her to go on high dose statin therapy with Lipitor. Unfortunately she was lost to f/u. She states that due to financial issues, she was unable to return for her nuclear stress test and also was unable to keep her prescriptions filled. She has been w/o atenolol and Lipitor for over a year. She has been taking baby ASA daily.   She presents to clinic today with complaints of intermittent substernal chest discomfort often occuring at rest. Symptoms have been occuring for the last 6 months and have progressively worsened. She denies any associated dyspnea, dizziness, syncope or near syncope.  No exacerbating factors. She has not tried SL NTG at home, as symptoms typically resolve spontaneously after several minutes.   Today in clinic, she denies any chest discomfort but does note severe 10/10 left shoulder/neck discomfort radiating down the length of her entire arm. This just started today. It is a dull ache. She denies numbness and tingling. Her BP in clinic today was elevated at 160/70. She was given 2 SL NTG in clinic and her pain completely resolved after the second dose. Her EKG demonstrates NSR with no ischemic abnormalities.   Current Outpatient Prescriptions  Medication Sig Dispense Refill  . aspirin 81 MG tablet Take 81 mg by mouth daily.      . Multiple Vitamin (MULTIVITAMIN WITH MINERALS) TABS Take 1  tablet by mouth daily.       No current facility-administered medications for this visit.    Allergies  Allergen Reactions  . Simvastatin     REACTION: constipation    History   Social History  . Marital Status: Married    Spouse Name: N/A    Number of Children: N/A  . Years of Education: N/A   Occupational History  . Not on file.   Social History Main Topics  . Smoking status: Former Research scientist (life sciences)  . Smokeless tobacco: Never Used  . Alcohol Use: Yes     Comment: Occasional  . Drug Use: Not on file  . Sexual Activity: Not on file   Other Topics Concern  . Not on file   Social History Narrative  . No narrative on file     Review of Systems: General: negative for chills, fever, night sweats or weight changes.  Cardiovascular: negative for chest pain, dyspnea on exertion, edema, orthopnea, palpitations, paroxysmal nocturnal dyspnea or shortness of breath Dermatological: negative for rash Respiratory: negative for cough or wheezing Urologic: negative for hematuria Abdominal: negative for nausea, vomiting, diarrhea, bright red blood per rectum, melena, or hematemesis Neurologic: negative for visual changes, syncope, or dizziness All other systems reviewed and are otherwise negative except as noted above.    Blood pressure 160/74, pulse 76, height 5\' 2"  (1.575 m), weight 145 lb (65.772 kg).  General appearance: alert, cooperative and no distress Neck: no carotid bruit and no  JVD Lungs: clear to auscultation bilaterally Heart: regular rate and rhythm Extremities: no LEE Pulses: 2+ and symmetric Skin: warm and dry Neurologic: Grossly normal  EKG NSR 76 bpm. No ischemic changes  ASSESSMENT AND PLAN:   1. Unstable Angina: known h/o of CAD, s/p single vessel bypass in 2001 with a LIMA to LAD due to high grade LAD stenosis. She has h/o HTN and HLD that has been untreated for over a year. Her BP today is elevated at 160/70 and she presents with active left upper extremity  pain that is nitrate responsive. I discussed her case with Dr. [place]. We feel that it is best to admit to telemetry, cycle cardiac enzymes x 3 and plan for a diagnostic LHC to redefine her coronary anatomy as it has been over 14 years since her last catheterization. Will check a fasting lipid panel in the am as she has been off of statin therapy for over a year. Will restart statin and BB. Continue ASA. Will place on IV heparin per pharmacy.   PLAN  Admit to telemetry. R/O for MI. Plan for diagnostic LHC +/- PCI with Dr. Tamala Julian in the am. Restart cardiac meds. IV heparin per pharmacy. NPO at midnight.   SIMMONS, BRITTAINYPA-C 10/11/2013 2:42 PM

## 2013-10-11 NOTE — Progress Notes (Signed)
ANTICOAGULATION CONSULT NOTE - Initial Consult  Pharmacy Consult for Heparin Indication: chest pain/ACS  Allergies  Allergen Reactions  . Simvastatin Other (See Comments)    REACTION: constipation    Patient Measurements: Height: 5\' 2"  (157.5 cm) Weight: 145 lb (65.772 kg) IBW/kg (Calculated) : 50.1 Heparin Dosing Weight:   Vital Signs: Temp: 98.3 F (36.8 C) (08/24 1820) Temp src: Oral (08/24 1820) BP: 154/68 mmHg (08/24 1820) Pulse Rate: 82 (08/24 1820)  Labs: No results found for this basename: HGB, HCT, PLT, APTT, LABPROT, INR, HEPARINUNFRC, CREATININE, CKTOTAL, CKMB, TROPONINI,  in the last 72 hours  Estimated Creatinine Clearance: 45.3 ml/min (by C-G formula based on Cr of 1.22).   Medical History: Past Medical History  Diagnosis Date  . HYPERLIPIDEMIA   . HYPERTENSION   . CORONARY ARTERY DISEASE   . COPD   . MENORRHAGIA   . Aortic insufficiency     Medications:  Scheduled:  . aspirin  324 mg Oral NOW   Or  . aspirin  300 mg Rectal NOW  . [START ON 10/12/2013] aspirin  81 mg Oral Daily  . [START ON 10/12/2013] aspirin  81 mg Oral Pre-Cath  . atorvastatin  40 mg Oral q1800  . carvedilol  3.125 mg Oral BID WC  . sodium chloride  3 mL Intravenous Q12H    Assessment: 58yo female with chest pain and hx CAD with CABG x 1 in 2001 who has been lost to f/u.  She has been taking a BASA daily; to start heparin.  Baseline labs are pending.  Pt with no hx of bleeding problems.  Goal of Therapy:  Heparin level 0.3-0.7 units/ml Monitor platelets by anticoagulation protocol: Yes   Plan:  1-  Heparin 3000 units IV x 1, 750 units/hr 2-  Heparin level 6hr 3-  Daily HL and CBC  Gracy Bruins, [facility]

## 2013-10-11 NOTE — Patient Instructions (Signed)
You are being admitted to [facility] for Unstable Angina. You will go to the Admissions area where they will get you checked in then you will be admitted to [facility].  You will have your Cardiac Cath done tomorrow at some point.

## 2013-10-12 ENCOUNTER — Ambulatory Visit (HOSPITAL_COMMUNITY): Admit: 2013-10-12 | Payer: Self-pay | Admitting: Interventional Cardiology

## 2013-10-12 ENCOUNTER — Encounter (HOSPITAL_COMMUNITY)
Admission: AD | Disposition: A | Discharge status: Home / Self Care | Payer: Self-pay | Source: Ambulatory Visit | Attending: Cardiology

## 2013-10-12 ENCOUNTER — Encounter (HOSPITAL_COMMUNITY): Payer: Self-pay | Admitting: Physician Assistant

## 2013-10-12 ENCOUNTER — Observation Stay (HOSPITAL_COMMUNITY): Payer: Managed Care, Other (non HMO)

## 2013-10-12 DIAGNOSIS — R079 Chest pain, unspecified: Secondary | ICD-10-CM

## 2013-10-12 DIAGNOSIS — I2510 Atherosclerotic heart disease of native coronary artery without angina pectoris: Secondary | ICD-10-CM

## 2013-10-12 HISTORY — PX: LEFT HEART CATHETERIZATION WITH CORONARY/GRAFT ANGIOGRAM: SHX5450

## 2013-10-12 LAB — LIPID PANEL
CHOL/HDL RATIO: 3.2 ratio
Cholesterol: 171 mg/dL (ref 0–200)
HDL: 53 mg/dL (ref >39)
LDL Cholesterol: 70 mg/dL (ref 0–99)
TRIGLYCERIDES: 241 mg/dL — AB (ref <150)
VLDL: 48 mg/dL — ABNORMAL HIGH (ref 0–40)

## 2013-10-12 LAB — HEMOGLOBIN A1C
Hgb A1c MFr Bld: 5.9 % — ABNORMAL HIGH (ref <5.7)
MEAN PLASMA GLUCOSE: 123 mg/dL — AB (ref <117)

## 2013-10-12 LAB — BASIC METABOLIC PANEL
Anion gap: 11 (ref 5–15)
BUN: 14 mg/dL (ref 6–23)
CO2: 26 meq/L (ref 19–32)
Calcium: 9.4 mg/dL (ref 8.4–10.5)
Chloride: 104 mEq/L (ref 96–112)
Creatinine, Ser: 1.15 mg/dL — ABNORMAL HIGH (ref 0.50–1.10)
GFR calc Af Amer: 60 mL/min — ABNORMAL LOW (ref >90)
GFR, EST NON AFRICAN AMERICAN: 52 mL/min — AB (ref >90)
GLUCOSE: 101 mg/dL — AB (ref 70–99)
Potassium: 4.3 mEq/L (ref 3.7–5.3)
SODIUM: 141 meq/L (ref 137–147)

## 2013-10-12 LAB — CBC
HEMATOCRIT: 38.4 % (ref 36.0–46.0)
HEMOGLOBIN: 12.8 g/dL (ref 12.0–15.0)
MCH: 29.6 pg (ref 26.0–34.0)
MCHC: 33.3 g/dL (ref 30.0–36.0)
MCV: 88.9 fL (ref 78.0–100.0)
Platelets: 221 10*3/uL (ref 150–400)
RBC: 4.32 MIL/uL (ref 3.87–5.11)
RDW: 12 % (ref 11.5–15.5)
WBC: 5.2 10*3/uL (ref 4.0–10.5)

## 2013-10-12 LAB — POCT ACTIVATED CLOTTING TIME: Activated Clotting Time: 95 seconds

## 2013-10-12 LAB — PROTIME-INR
INR: 1.05 (ref 0.00–1.49)
PROTHROMBIN TIME: 13.7 s (ref 11.6–15.2)

## 2013-10-12 LAB — HEPARIN LEVEL (UNFRACTIONATED)
HEPARIN UNFRACTIONATED: 0.32 [IU]/mL (ref 0.30–0.70)
Heparin Unfractionated: 0.32 IU/mL (ref 0.30–0.70)

## 2013-10-12 SURGERY — LEFT HEART CATHETERIZATION WITH CORONARY/GRAFT ANGIOGRAM
Anesthesia: LOCAL

## 2013-10-12 MED ORDER — NITROGLYCERIN 1 MG/10 ML FOR IR/CATH LAB
INTRA_ARTERIAL | Status: AC
  Filled 2013-10-12: qty 10

## 2013-10-12 MED ORDER — MIDAZOLAM HCL 2 MG/2ML IJ SOLN
INTRAMUSCULAR | Status: AC
  Filled 2013-10-12: qty 2

## 2013-10-12 MED ORDER — ONDANSETRON HCL 4 MG/2ML IJ SOLN: 4.0000 mg | Freq: Four times a day (QID) | INTRAMUSCULAR | Status: DC | PRN

## 2013-10-12 MED ORDER — PNEUMOCOCCAL VAC POLYVALENT 25 MCG/0.5ML IJ INJ
0.5000 mL | INJECTION | INTRAMUSCULAR | Status: AC
  Administered 2013-10-13: 0.5 mL via INTRAMUSCULAR
  Filled 2013-10-12: qty 0.5

## 2013-10-12 MED ORDER — LIDOCAINE HCL (PF) 1 % IJ SOLN
INTRAMUSCULAR | Status: AC
  Filled 2013-10-12: qty 30

## 2013-10-12 MED ORDER — ACETAMINOPHEN 325 MG PO TABS: 650.0000 mg | ORAL_TABLET | ORAL | Status: DC | PRN

## 2013-10-12 MED ORDER — ASPIRIN EC 81 MG PO TBEC
81.0000 mg | DELAYED_RELEASE_TABLET | Freq: Every day | ORAL | Status: DC
  Administered 2013-10-13: 81 mg via ORAL
  Filled 2013-10-12: qty 1

## 2013-10-12 MED ORDER — HEPARIN (PORCINE) IN NACL 2-0.9 UNIT/ML-% IJ SOLN
INTRAMUSCULAR | Status: AC
  Filled 2013-10-12: qty 1000

## 2013-10-12 MED ORDER — FENTANYL CITRATE 0.05 MG/ML IJ SOLN
50.0000 ug | Freq: Once | INTRAMUSCULAR | Status: AC
  Administered 2013-10-12: 50 ug via INTRAVENOUS

## 2013-10-12 MED ORDER — FENTANYL CITRATE 0.05 MG/ML IJ SOLN
INTRAMUSCULAR | Status: AC
  Filled 2013-10-12: qty 2

## 2013-10-12 MED ORDER — TRAMADOL HCL 50 MG PO TABS
50.0000 mg | ORAL_TABLET | Freq: Four times a day (QID) | ORAL | Status: DC | PRN
  Administered 2013-10-12 – 2013-10-13 (×2): 50 mg via ORAL
  Filled 2013-10-12 (×2): qty 1

## 2013-10-12 MED ORDER — FENTANYL CITRATE 0.05 MG/ML IJ SOLN
INTRAMUSCULAR | Status: AC
  Administered 2013-10-12: 50 ug via INTRAVENOUS
  Filled 2013-10-12: qty 2

## 2013-10-12 MED ORDER — SODIUM CHLORIDE 0.9 % IV SOLN
INTRAVENOUS | Status: DC
  Administered 2013-10-12: 14:00:00 via INTRAVENOUS

## 2013-10-12 MED ORDER — CARVEDILOL 6.25 MG PO TABS
6.2500 mg | ORAL_TABLET | Freq: Two times a day (BID) | ORAL | Status: DC
  Administered 2013-10-12 – 2013-10-13 (×2): 6.25 mg via ORAL
  Filled 2013-10-12 (×4): qty 1

## 2013-10-12 NOTE — CV Procedure (Signed)
Casey Huerta is a 58 y.o. female    [id number]  [id number] LOCATION:  FACILITY: [place]  PHYSICIAN: Troy Sine, MD, [facility] 29-Jun-1955   DATE OF PROCEDURE:  10/12/2013    CARDIAC CATHETERIZATION     HISTORY:    Casey Huerta is a 58 y.o. female who underwent CABG revascularization surgery in 2001 with LIMA graft placed to the LAD done by Dr. Cyndia Bent.  She was admitted to the hospital yesterday with nitrate responsive chest pain worrisome for unstable angina.  She is referred for definitive cardiac catheterization.   PROCEDURE: Left heart catheterization: Coronary angiography of the native coronary arteries and left internal mammary artery, left ventriculography.  The patient was brought to the [facility] cardiac catherization labaratory in the postabsorptive state. She was premedicated with Versed 2 mg and fentanyl 50 mcg intravenously.  Her right groin was prepped and shaved in usual sterile fashion. Xylocaine 1% was used for local anesthesia. A 5 French sheath was inserted into the R femoral artery. Diagnostic catheterizatiion was done with 5 [place] FL4, FR4, LIMA and pigtail catheters. Left ventriculography was done with 25 cc Omnipaque contrast. Hemostasis was obtained by direct manual compression. The patient tolerated the procedure well.   HEMODYNAMICS:   Central Aorta: 155/65   Left Ventricle: 155/15  ANGIOGRAPHY:  Left main: Angiographically normal vessel, which bifurcated into the LAD and left circumflex coronary artery   LAD: Smooth 20% ostial narrowing.  The LAD was otherwise free of significant obstructive disease.  There was retrograde filling of the LIMA graft up to its proximal segment.  The distal LAD extended to and wrapped around the LV apex.  Left circumflex: Angiographically normal vessel which gave rise to 2 major marginal branches..   Right coronary artery: Normal vessel which supplied the posterior descending artery.  LIMA to LAD: This vessel  is atretic.  When injected selectively from the left subclavian.  The vessel was filled predominantly via retrograde filling from the native LAD.   Left ventriculography revealed normal left ventricular systolic function.  There were no segmental wall motion abnormalities.  There was no mitral regurgitation.   IMPRESSION:  Normal left ventricular function with an ejection fraction of approximately 55%.  No significant obstructive native coronary artery disease with mild smooth 20% ostial LAD narrowing.  Atretic LIMA graft arising from the subclavian with predominant retrograde filling of the graft via the LAD injection.  RECOMMENDATION:  Medical therapy.  Troy Sine, MD, [facility] 10/12/2013 2:03 PM

## 2013-10-12 NOTE — Progress Notes (Signed)
SUBJECTIVE:  She is still having some of the left arm pain that she was having.  Also with some right sided chest pain.   PHYSICAL EXAM Filed Vitals:   10/11/13 1820 10/11/13 2151 10/12/13 0500 10/12/13 0700  BP: 154/68 145/51 126/52 151/54  Pulse: 82 69 68 64  Temp: 98.3 F (36.8 C) 98.7 F (37.1 C) 98.1 F (36.7 C) 98.4 F (36.9 C)  TempSrc: Oral Oral Oral Oral  Resp: 20 18 18 16   Height: 5\' 2"  (1.575 m)     Weight: 145 lb (65.772 kg)  144 lb 13.5 oz (65.7 kg)   SpO2: 99% 96% 97% 97%   General:  No distress Lungs:  Clear Heart:  RRR Abdomen:  Positive bowel sounds, no rebound no guarding Extremities:  No edema   LABS:  Results for orders placed during the hospital encounter of 10/11/13 (from the past 24 hour(s))  COMPREHENSIVE METABOLIC PANEL     Status: Abnormal   Collection Time    10/11/13  8:34 PM      Result Value Ref Range   Sodium 141  137 - 147 mEq/L   Potassium 3.5 (*) 3.7 - 5.3 mEq/L   Chloride 104  96 - 112 mEq/L   CO2 25  19 - 32 mEq/L   Glucose, Bld 141 (*) 70 - 99 mg/dL   BUN 13  6 - 23 mg/dL   Creatinine, Ser 1.06  0.50 - 1.10 mg/dL   Calcium 9.3  8.4 - 10.5 mg/dL   Total Protein 6.8  6.0 - 8.3 g/dL   Albumin 3.4 (*) 3.5 - 5.2 g/dL   AST 18  0 - 37 U/L   ALT 16  0 - 35 U/L   Alkaline Phosphatase 77  39 - 117 U/L   Total Bilirubin 0.2 (*) 0.3 - 1.2 mg/dL   GFR calc non Af Amer 57 (*) >90 mL/min   GFR calc Af Amer 66 (*) >90 mL/min   Anion gap 12  5 - 15  HEMOGLOBIN A1C     Status: Abnormal   Collection Time    10/11/13  8:34 PM      Result Value Ref Range   Hemoglobin A1C 5.9 (*) <5.7 %   Mean Plasma Glucose 123 (*) <117 mg/dL  CBC WITH DIFFERENTIAL     Status: None   Collection Time    10/11/13  8:34 PM      Result Value Ref Range   WBC 5.6  4.0 - 10.5 K/uL   RBC 4.18  3.87 - 5.11 MIL/uL   Hemoglobin 12.4  12.0 - 15.0 g/dL   HCT 37.0  36.0 - 46.0 %   MCV 88.5  78.0 - 100.0 fL   MCH 29.7  26.0 - 34.0 pg   MCHC 33.5  30.0 - 36.0  g/dL   RDW 12.1  11.5 - 15.5 %   Platelets 217  150 - 400 K/uL   Neutrophils Relative % 45  43 - 77 %   Neutro Abs 2.5  1.7 - 7.7 K/uL   Lymphocytes Relative 40  12 - 46 %   Lymphs Abs 2.2  0.7 - 4.0 K/uL   Monocytes Relative 11  3 - 12 %   Monocytes Absolute 0.6  0.1 - 1.0 K/uL   Eosinophils Relative 3  0 - 5 %   Eosinophils Absolute 0.2  0.0 - 0.7 K/uL   Basophils Relative 1  0 - 1 %   Basophils  Absolute 0.0  0.0 - 0.1 K/uL  HEPARIN LEVEL (UNFRACTIONATED)     Status: None   Collection Time    10/12/13  1:10 AM      Result Value Ref Range   Heparin Unfractionated 0.32  0.30 - 0.70 IU/mL  CBC     Status: None   Collection Time    10/12/13  1:10 AM      Result Value Ref Range   WBC 5.2  4.0 - 10.5 K/uL   RBC 4.32  3.87 - 5.11 MIL/uL   Hemoglobin 12.8  12.0 - 15.0 g/dL   HCT 38.4  36.0 - 46.0 %   MCV 88.9  78.0 - 100.0 fL   MCH 29.6  26.0 - 34.0 pg   MCHC 33.3  30.0 - 36.0 g/dL   RDW 12.0  11.5 - 15.5 %   Platelets 221  150 - 400 K/uL  BASIC METABOLIC PANEL     Status: Abnormal   Collection Time    10/12/13  1:10 AM      Result Value Ref Range   Sodium 141  137 - 147 mEq/L   Potassium 4.3  3.7 - 5.3 mEq/L   Chloride 104  96 - 112 mEq/L   CO2 26  19 - 32 mEq/L   Glucose, Bld 101 (*) 70 - 99 mg/dL   BUN 14  6 - 23 mg/dL   Creatinine, Ser 1.15 (*) 0.50 - 1.10 mg/dL   Calcium 9.4  8.4 - 10.5 mg/dL   GFR calc non Af Amer 52 (*) >90 mL/min   GFR calc Af Amer 60 (*) >90 mL/min   Anion gap 11  5 - 15  PROTIME-INR     Status: None   Collection Time    10/12/13  1:10 AM      Result Value Ref Range   Prothrombin Time 13.7  11.6 - 15.2 seconds   INR 1.05  0.00 - 1.49  LIPID PANEL     Status: Abnormal   Collection Time    10/12/13  1:10 AM      Result Value Ref Range   Cholesterol 171  0 - 200 mg/dL   Triglycerides 241 (*) <150 mg/dL   HDL 53  >39 mg/dL   Total CHOL/HDL Ratio 3.2     VLDL 48 (*) 0 - 40 mg/dL   LDL Cholesterol 70  0 - 99 mg/dL  HEPARIN LEVEL  (UNFRACTIONATED)     Status: None   Collection Time    10/12/13  9:14 AM      Result Value Ref Range   Heparin Unfractionated 0.32  0.30 - 0.70 IU/mL    Intake/Output Summary (Last 24 hours) at 10/12/13 1155 Last data filed at 10/12/13 0500  Gross per 24 hour  Intake      0 ml  Output    300 ml  Net   -300 ml    ASSESSMENT AND PLAN:  CHEST PAIN:  History of CABG.  Cath today.    HTN:     I will increase her beta blocker.     Jeneen Rinks [facility] 10/12/2013 11:55 AM

## 2013-10-12 NOTE — Progress Notes (Signed)
ANTICOAGULATION CONSULT NOTE - Follow Up Consult  Pharmacy Consult for heparin Indication: USAP  Labs:  Recent Labs  10/11/13 2034 10/12/13 0110  HGB 12.4 12.8  HCT 37.0 38.4  PLT 217 221  LABPROT  --  13.7  INR  --  1.05  HEPARINUNFRC  --  0.32  CREATININE 1.06  --     Assessment: 58yo female therapeutic on heparin with initial dosing for USAP though at low end of goal and was drawn just 4.5hr from bolus so expect level to drop a little.  Goal of Therapy:  Heparin level 0.3-0.7 units/ml   Plan:  Will increase heparin gtt slightly to 800 units/hr and check level in [place], PharmD, BCPS  10/12/2013,2:37 AM

## 2013-10-12 NOTE — Progress Notes (Signed)
UR completed 

## 2013-10-12 NOTE — Interval H&P Note (Signed)
Cath Lab Visit (complete for each Cath Lab visit)  Clinical Evaluation Leading to the Procedure:   ACS: No.  Non-ACS:    Anginal Classification: CCS III  Anti-ischemic medical therapy: Maximal Therapy (2 or more classes of medications)  Non-Invasive Test Results: No non-invasive testing performed  Prior CABG: Previous CABG      History and Physical Interval Note:  10/12/2013 1:04 PM  [name]  has presented today for surgery, with the diagnosis of cp  The various methods of treatment have been discussed with the patient and family. After consideration of risks, benefits and other options for treatment, the patient has consented to  Procedure(s): LEFT HEART CATHETERIZATION WITH CORONARY/GRAFT ANGIOGRAM (N/A) as a surgical intervention .  The patient's history has been reviewed, patient examined, no change in status, stable for surgery.  I have reviewed the patient's chart and labs.  Questions were answered to the patient's satisfaction.     KELLY,THOMAS A

## 2013-10-12 NOTE — Progress Notes (Deleted)
Patient: Casey Huerta / Admit Date: 10/11/2013 / Date of Encounter: 10/12/2013, 9:05 AM   Subjective: Still w/ left arm pain though not as bad as 10/11/13. Has not received any further SL NTG. No chest pain, palpitations, SOB, or dyspnea. She is for cardiac cath today.    Objective: Telemetry: NSR, 60s Physical Exam: Blood pressure 151/54, pulse 64, temperature 98.4 F (36.9 C), temperature source Oral, resp. rate 16, height 5\' 2"  (1.575 m), weight 144 lb 13.5 oz (65.7 kg), SpO2 97.00%. General: Well developed, well nourished, in no acute distress. Head: Normocephalic, atraumatic, sclera non-icteric, no xanthomas, nares are without discharge. Neck: Negative for carotid bruits. JVP not elevated. Lungs: Clear bilaterally to auscultation without wheezes, rales, or rhonchi. Breathing is unlabored. Heart: RRR S1 S2 without murmurs, rubs, or gallops.  Abdomen: Soft, non-tender, non-distended with normoactive bowel sounds. No rebound/guarding. Extremities: No clubbing or cyanosis. No edema. Distal pedal pulses are 2+ and equal bilaterally. Neuro: Alert and oriented X 3. Moves all extremities spontaneously. Psych:  Responds to questions appropriately with a normal affect.   Intake/Output Summary (Last 24 hours) at 10/12/13 0905 Last data filed at 10/12/13 0500  Gross per 24 hour  Intake      0 ml  Output    300 ml  Net   -300 ml    Inpatient Medications:  . aspirin  324 mg Oral NOW   Or  . aspirin  300 mg Rectal NOW  . aspirin  81 mg Oral Daily  . atorvastatin  40 mg Oral q1800  . carvedilol  3.125 mg Oral BID WC  . sodium chloride  3 mL Intravenous Q12H   Infusions:  . sodium chloride 75 mL/hr at 10/12/13 0350  . heparin 800 Units/hr (10/12/13 0351)    Labs:  Recent Labs  10/11/13 2034 10/12/13 0110  NA 141 141  K 3.5* 4.3  CL 104 104  CO2 25 26  GLUCOSE 141* 101*  BUN 13 14  CREATININE 1.06 1.15*  CALCIUM 9.3 9.4    Recent Labs  10/11/13 2034  AST 18    ALT 16  ALKPHOS 77  BILITOT 0.2*  PROT 6.8  ALBUMIN 3.4*    Recent Labs  10/11/13 2034 10/12/13 0110  WBC 5.6 5.2  NEUTROABS 2.5  --   HGB 12.4 12.8  HCT 37.0 38.4  MCV 88.5 88.9  PLT 217 221   No results found for this basename: CKTOTAL, CKMB, TROPONINI,  in the last 72 hours No components found with this basename: POCBNP,   Recent Labs  10/11/13 2034  HGBA1C 5.9*     Radiology/Studies:  No results found.--> STAT CXR ordered.    Assessment and Plan  58 y/o F with h/o CAD s/p CABG x 1 w/ LIMA-->LAD (2001), HTN, HL, and COPD who presented to [facility] as a direct admit from the office 2/2 intermittent substernal chest pain occuring over the past 6 months that has progressively worsened. Symptoms often occur at rest. In the office on 8/24 she denied any chest discomfort, but did note severe 10/10 left shoulder/neck discomfort radiating down the length of her entire arm. This pain resolved resolved with 2 SL NTG in the office. Her EKG demonstrated NSR with no ischemic changes. She is for cardiac cath today. She continues to have left arm pain though not as bad as 8/24 when she was in the office. No chest pain.    1. Unstable angina: -She presented to the office on  8/24 with active left upper extremity pain that was nitrate responsive.  -Last LHC 14 years ago  -Myoview 2008 without ischemia, EF 63%  -Cardiac cath today-1200 -On aspirin, b-blocker, statin, heparin gtt  2. HTN -H/o uncontrolled HTN, improved from previous readings (160/70-->151/54) -On [place] 3/125 mg bid-titrate as needed  3. HL -Lipitor 40 mg -TC 171, LDL 70, HDL 53, trigs 241  Signed, Christell Faith, PA-C 10/12/2013 9:27 AM

## 2013-10-12 NOTE — H&P (Signed)
Patient seen and examined and history reviewed. Agree with above findings and plan. Patient presents with symptoms concerning for unstable angina. This may be related to noncompliance with medications. Will put in observation and cycle cardiac enzymes. Plan diagnostic cardiac cath in am.  Kolyn Rozario [place], [place] 10/12/2013 10:11 AM

## 2013-10-12 NOTE — H&P (View-Only) (Signed)
SUBJECTIVE:  She is still having some of the left arm pain that she was having.  Also with some right sided chest pain.   PHYSICAL EXAM Filed Vitals:   10/11/13 1820 10/11/13 2151 10/12/13 0500 10/12/13 0700  BP: 154/68 145/51 126/52 151/54  Pulse: 82 69 68 64  Temp: 98.3 F (36.8 C) 98.7 F (37.1 C) 98.1 F (36.7 C) 98.4 F (36.9 C)  TempSrc: Oral Oral Oral Oral  Resp: 20 18 18 16   Height: 5\' 2"  (1.575 m)     Weight: 145 lb (65.772 kg)  144 lb 13.5 oz (65.7 kg)   SpO2: 99% 96% 97% 97%   General:  No distress Lungs:  Clear Heart:  RRR Abdomen:  Positive bowel sounds, no rebound no guarding Extremities:  No edema   LABS:  Results for orders placed during the hospital encounter of 10/11/13 (from the past 24 hour(s))  COMPREHENSIVE METABOLIC PANEL     Status: Abnormal   Collection Time    10/11/13  8:34 PM      Result Value Ref Range   Sodium 141  137 - 147 mEq/L   Potassium 3.5 (*) 3.7 - 5.3 mEq/L   Chloride 104  96 - 112 mEq/L   CO2 25  19 - 32 mEq/L   Glucose, Bld 141 (*) 70 - 99 mg/dL   BUN 13  6 - 23 mg/dL   Creatinine, Ser 1.06  0.50 - 1.10 mg/dL   Calcium 9.3  8.4 - 10.5 mg/dL   Total Protein 6.8  6.0 - 8.3 g/dL   Albumin 3.4 (*) 3.5 - 5.2 g/dL   AST 18  0 - 37 U/L   ALT 16  0 - 35 U/L   Alkaline Phosphatase 77  39 - 117 U/L   Total Bilirubin 0.2 (*) 0.3 - 1.2 mg/dL   GFR calc non Af Amer 57 (*) >90 mL/min   GFR calc Af Amer 66 (*) >90 mL/min   Anion gap 12  5 - 15  HEMOGLOBIN A1C     Status: Abnormal   Collection Time    10/11/13  8:34 PM      Result Value Ref Range   Hemoglobin A1C 5.9 (*) <5.7 %   Mean Plasma Glucose 123 (*) <117 mg/dL  CBC WITH DIFFERENTIAL     Status: None   Collection Time    10/11/13  8:34 PM      Result Value Ref Range   WBC 5.6  4.0 - 10.5 K/uL   RBC 4.18  3.87 - 5.11 MIL/uL   Hemoglobin 12.4  12.0 - 15.0 g/dL   HCT 37.0  36.0 - 46.0 %   MCV 88.5  78.0 - 100.0 fL   MCH 29.7  26.0 - 34.0 pg   MCHC 33.5  30.0 - 36.0  g/dL   RDW 12.1  11.5 - 15.5 %   Platelets 217  150 - 400 K/uL   Neutrophils Relative % 45  43 - 77 %   Neutro Abs 2.5  1.7 - 7.7 K/uL   Lymphocytes Relative 40  12 - 46 %   Lymphs Abs 2.2  0.7 - 4.0 K/uL   Monocytes Relative 11  3 - 12 %   Monocytes Absolute 0.6  0.1 - 1.0 K/uL   Eosinophils Relative 3  0 - 5 %   Eosinophils Absolute 0.2  0.0 - 0.7 K/uL   Basophils Relative 1  0 - 1 %   Basophils  Absolute 0.0  0.0 - 0.1 K/uL  HEPARIN LEVEL (UNFRACTIONATED)     Status: None   Collection Time    10/12/13  1:10 AM      Result Value Ref Range   Heparin Unfractionated 0.32  0.30 - 0.70 IU/mL  CBC     Status: None   Collection Time    10/12/13  1:10 AM      Result Value Ref Range   WBC 5.2  4.0 - 10.5 K/uL   RBC 4.32  3.87 - 5.11 MIL/uL   Hemoglobin 12.8  12.0 - 15.0 g/dL   HCT 38.4  36.0 - 46.0 %   MCV 88.9  78.0 - 100.0 fL   MCH 29.6  26.0 - 34.0 pg   MCHC 33.3  30.0 - 36.0 g/dL   RDW 12.0  11.5 - 15.5 %   Platelets 221  150 - 400 K/uL  BASIC METABOLIC PANEL     Status: Abnormal   Collection Time    10/12/13  1:10 AM      Result Value Ref Range   Sodium 141  137 - 147 mEq/L   Potassium 4.3  3.7 - 5.3 mEq/L   Chloride 104  96 - 112 mEq/L   CO2 26  19 - 32 mEq/L   Glucose, Bld 101 (*) 70 - 99 mg/dL   BUN 14  6 - 23 mg/dL   Creatinine, Ser 1.15 (*) 0.50 - 1.10 mg/dL   Calcium 9.4  8.4 - 10.5 mg/dL   GFR calc non Af Amer 52 (*) >90 mL/min   GFR calc Af Amer 60 (*) >90 mL/min   Anion gap 11  5 - 15  PROTIME-INR     Status: None   Collection Time    10/12/13  1:10 AM      Result Value Ref Range   Prothrombin Time 13.7  11.6 - 15.2 seconds   INR 1.05  0.00 - 1.49  LIPID PANEL     Status: Abnormal   Collection Time    10/12/13  1:10 AM      Result Value Ref Range   Cholesterol 171  0 - 200 mg/dL   Triglycerides 241 (*) <150 mg/dL   HDL 53  >39 mg/dL   Total CHOL/HDL Ratio 3.2     VLDL 48 (*) 0 - 40 mg/dL   LDL Cholesterol 70  0 - 99 mg/dL  HEPARIN LEVEL  (UNFRACTIONATED)     Status: None   Collection Time    10/12/13  9:14 AM      Result Value Ref Range   Heparin Unfractionated 0.32  0.30 - 0.70 IU/mL    Intake/Output Summary (Last 24 hours) at 10/12/13 1155 Last data filed at 10/12/13 0500  Gross per 24 hour  Intake      0 ml  Output    300 ml  Net   -300 ml    ASSESSMENT AND PLAN:  CHEST PAIN:  History of CABG.  Cath today.    HTN:     I will increase her beta blocker.     Jeneen Rinks [facility] 10/12/2013 11:55 AM

## 2013-10-12 NOTE — H&P (Signed)
10/11/2013  Casey Huerta  1955-04-27  [id number]   Primary Physician: No PCP Per Patient  Primary Cardiologist: Dr. Stanford Breed   HPI: The patient is is a 58 year old AA female who presents to clinic today for routine evaluation. She is followed by Dr. Stanford Breed. She has a known history of CAD, status post CABG x1 with a LIMA to the LAD in 2001 by Dr. Cyndia Bent. Her last LHC was in March 2008 which showed an ejection fraction of 63% and no ischemia. Her last office visit with Dr. Stanford Breed was in Jan 2013. At that time, he ordered for her to undergo repeat nuclear stress testing. He also instructed her to go on high dose statin therapy with Lipitor. Unfortunately she was lost to f/u. She states that due to financial issues, she was unable to return for her nuclear stress test and also was unable to keep her prescriptions filled. She has been w/o atenolol and Lipitor for over a year. She has been taking baby ASA daily.   She presents to clinic today with complaints of intermittent substernal chest discomfort often occuring at rest. Symptoms have been occuring for the last 6 months and have progressively worsened. She denies any associated dyspnea, dizziness, syncope or near syncope. No exacerbating factors. She has not tried SL NTG at home, as symptoms typically resolve spontaneously after several minutes.   Today in clinic, she denies any chest discomfort but does note severe 10/10 left shoulder/neck discomfort radiating down the length of her entire arm. This just started today. It is a dull ache. She denies numbness and tingling. Her BP in clinic today was elevated at 160/70. She was given 2 SL NTG in clinic and her pain completely resolved after the second dose. Her EKG demonstrates NSR with no ischemic abnormalities.    Current Outpatient Prescriptions   Medication  Sig  Dispense  Refill   .  aspirin 81 MG tablet  Take 81 mg by mouth daily.     .  Multiple Vitamin (MULTIVITAMIN WITH MINERALS) TABS   Take 1 tablet by mouth daily.      No current facility-administered medications for this visit.    Allergies   Allergen  Reactions   .  Simvastatin      REACTION: constipation    History    Social History   .  Marital Status:  Married     Spouse Name:  N/A     Number of Children:  N/A   .  Years of Education:  N/A    Occupational History   .  Not on file.    Social History Main Topics   .  Smoking status:  Former Research scientist (life sciences)   .  Smokeless tobacco:  Never Used   .  Alcohol Use:  Yes      Comment: Occasional   .  Drug Use:  Not on file   .  Sexual Activity:  Not on file    Other Topics  Concern   .  Not on file    Social History Narrative   .  No narrative on file    Family History  Problem Relation Age of Onset  . Heart disease Mother   . Heart disease Sister    Review of Systems:  General: negative for chills, fever, night sweats or weight changes.  Cardiovascular: positive for chest pain, negative for dyspnea on exertion, edema, orthopnea, palpitations, paroxysmal nocturnal dyspnea or shortness of breath  Dermatological: negative for rash  Respiratory:  negative for cough or wheezing  Urologic: negative for hematuria  Abdominal: negative for nausea, vomiting, diarrhea, bright red blood per rectum, melena, or hematemesis  Neurologic: negative for visual changes, syncope, or dizziness  All other systems reviewed and are otherwise negative except as noted above.   Blood pressure 160/74, pulse 76, height 5\' 2"  (1.575 m), weight 145 lb (65.772 kg).  General appearance: alert, cooperative and no distress  Neck: no carotid bruit and no JVD  Lungs: clear to auscultation bilaterally  Heart: regular rate and rhythm  Extremities: no LEE  Pulses: 2+ and symmetric  Skin: warm and dry  Neurologic: Grossly normal    EKG NSR 76 bpm. No ischemic changes   ASSESSMENT AND PLAN:   1. Unstable Angina: known h/o of CAD, s/p single vessel bypass in 2001 with a LIMA to LAD due to  high grade LAD stenosis. She has h/o HTN and HLD that has been untreated for over a year. Her BP today is elevated at 160/70 and she presents with active left upper extremity pain that is nitrate responsive. I discussed her case with Dr. [place]. We feel that it is best to admit to telemetry, cycle cardiac enzymes x 3 and plan for a diagnostic LHC to redefine her coronary anatomy as it has been over 14 years since her last catheterization. Will check a fasting lipid panel in the am as she has been off of statin therapy for over a year. Will restart statin and BB. Continue ASA. Will place on IV heparin per pharmacy.    PLAN Admit to telemetry. R/O for MI. Plan for diagnostic LHC +/- PCI with Dr. Tamala Julian in the am. Restart cardiac meds. IV heparin per pharmacy. NPO at midnight.    Kimberlea Schlag, BRITTAINYPA-C  10/11/2013  2:42 PM

## 2013-10-12 NOTE — Progress Notes (Signed)
Site area: right groin Site Prior to Removal:  Level 0 Pressure Applied For: 20 minutes Manual:   yes Patient Status During Pull:  stable Post Pull Site:  Level 0 Post Pull Instructions Given:  yes Post Pull Pulses Present: yes and remained palpable during sheath pull Dressing Applied:  tegaderm Bedrest begins @ 1216 Comments: no complications

## 2013-10-13 ENCOUNTER — Encounter (HOSPITAL_COMMUNITY): Payer: Self-pay | Admitting: Physician Assistant

## 2013-10-13 DIAGNOSIS — R079 Chest pain, unspecified: Secondary | ICD-10-CM | POA: Diagnosis not present

## 2013-10-13 MED ORDER — ONDANSETRON HCL 4 MG PO TABS: 4.0000 mg | ORAL_TABLET | Freq: Three times a day (TID) | ORAL | Status: DC | PRN

## 2013-10-13 MED ORDER — ATORVASTATIN CALCIUM 40 MG PO TABS: 40.0000 mg | ORAL_TABLET | Freq: Every day | ORAL | Status: DC

## 2013-10-13 MED ORDER — CARVEDILOL 6.25 MG PO TABS: 6.2500 mg | ORAL_TABLET | Freq: Two times a day (BID) | ORAL | Status: DC

## 2013-10-13 NOTE — Discharge Instructions (Signed)
Chest Pain (Nonspecific) °It is often hard to give a specific diagnosis for the cause of chest pain. There is always a chance that your pain could be related to something serious, such as a heart attack or a blood clot in the lungs. You need to follow up with your health care provider for further evaluation. °CAUSES  °· Heartburn. °· Pneumonia or bronchitis. °· Anxiety or stress. °· Inflammation around your heart (pericarditis) or lung (pleuritis or pleurisy). °· A blood clot in the lung. °· A collapsed lung (pneumothorax). It can develop suddenly on its own (spontaneous pneumothorax) or from trauma to the chest. °· Shingles infection (herpes zoster virus). °The chest wall is composed of bones, muscles, and cartilage. Any of these can be the source of the pain. °· The bones can be bruised by injury. °· The muscles or cartilage can be strained by coughing or overwork. °· The cartilage can be affected by inflammation and become sore (costochondritis). °DIAGNOSIS  °Lab tests or other studies may be needed to find the cause of your pain. Your health care provider may have you take a test called an ambulatory electrocardiogram (ECG). An ECG records your heartbeat patterns over a 24-hour period. You may also have other tests, such as: °· Transthoracic echocardiogram (TTE). During echocardiography, sound waves are used to evaluate how blood flows through your heart. °· Transesophageal echocardiogram (TEE). °· Cardiac monitoring. This allows your health care provider to monitor your heart rate and rhythm in real time. °· Holter monitor. This is a portable device that records your heartbeat and can help diagnose heart arrhythmias. It allows your health care provider to track your heart activity for several days, if needed. °· Stress tests by exercise or by giving medicine that makes the heart beat faster. °TREATMENT  °· Treatment depends on what may be causing your chest pain. Treatment may include: °¨ Acid blockers for  heartburn. °¨ Anti-inflammatory medicine. °¨ Pain medicine for inflammatory conditions. °¨ Antibiotics if an infection is present. °· You may be advised to change lifestyle habits. This includes stopping smoking and avoiding alcohol, caffeine, and chocolate. °· You may be advised to keep your head raised (elevated) when sleeping. This reduces the chance of acid going backward from your stomach into your esophagus. °Most of the time, nonspecific chest pain will improve within 2-3 days with rest and mild pain medicine.  °HOME CARE INSTRUCTIONS  °· If antibiotics were prescribed, take them as directed. Finish them even if you start to feel better. °· For the next few days, avoid physical activities that bring on chest pain. Continue physical activities as directed. °· Do not use any tobacco products, including cigarettes, chewing tobacco, or electronic cigarettes. °· Avoid drinking alcohol. °· Only take medicine as directed by your health care provider. °· Follow your health care provider's suggestions for further testing if your chest pain does not go away. °· Keep any follow-up appointments you made. If you do not go to an appointment, you could develop lasting (chronic) problems with pain. If there is any problem keeping an appointment, call to reschedule. °SEEK MEDICAL CARE IF:  °· Your chest pain does not go away, even after treatment. °· You have a rash with blisters on your chest. °· You have a fever. °SEEK IMMEDIATE MEDICAL CARE IF:  °· You have increased chest pain or pain that spreads to your arm, neck, jaw, back, or abdomen. °· You have shortness of breath. °· You have an increasing cough, or you cough   up blood.  You have severe back or abdominal pain.  You feel nauseous or vomit.  You have severe weakness.  You faint.  You have chills. This is an emergency. Do not wait to see if the pain will go away. Get medical help at once. Call your local emergency services (911 in [place] Do not drive  yourself to the hospital. MAKE SURE YOU:   Understand these instructions.  Will watch your condition.  Will get help right away if you are not doing well or get worse. Document Released: 11/14/2004 Document Revised: 02/09/2013 Document Reviewed: 09/10/2007 [facility] Patient Information 2015 [place], [place]. This information is not intended to replace advice given to you by your health care provider. Make sure you discuss any questions you have with your health care provider.  No driving for 24 hours. No lifting over 5 lbs for 1 week. No sexual activity for 1 week. You may return to work on 10/18/2013. Keep procedure site clean & dry. If you notice increased pain, swelling, bleeding or pus, call/return!  You may shower, but no soaking baths/hot tubs/pools for 1 week.

## 2013-10-13 NOTE — Progress Notes (Signed)
    SUBJECTIVE:  She reports that she threw up this morning.     PHYSICAL EXAM Filed Vitals:   10/12/13 1830 10/12/13 2047 10/12/13 2100 10/13/13 0651  BP: 143/53 160/57 151/57 146/70  Pulse: 77 68 70 72  Temp:   98.6 F (37 C) 98.4 F (36.9 C)  TempSrc:    Oral  Resp:   18 18  Height:      Weight:    144 lb 14.4 oz (65.726 kg)  SpO2: 97%  98% 97%   General:  No distress Lungs:  Clear Heart:  RRR Abdomen:  Positive bowel sounds, no rebound no guarding Extremities:  No edema , right groin without hematoma or echymosis.   LABS:  Results for orders placed during the hospital encounter of 10/11/13 (from the past 24 hour(s))  POCT ACTIVATED CLOTTING TIME     Status: None   Collection Time    10/12/13  1:38 PM      Result Value Ref Range   Activated Clotting Time 95      Intake/Output Summary (Last 24 hours) at 10/13/13 1006 Last data filed at 10/13/13 0159  Gross per 24 hour  Intake 2223.16 ml  Output      0 ml  Net 2223.16 ml    ASSESSMENT AND PLAN:  CHEST PAIN:  History of CABG.  Cath without obstructive disease.    HTN:     I increased her beta blocker yesterday and I would continue beta blocker at discharge.    NAUSEA:  I am not sure why she threw up this AM.  Her exam is benign.  She did not have any abnormalities on her LFTs or indication of gallbladder or pancreatic issues.  She could go home if she is feeling better this PM.  Minus Breeding 10/13/2013 10:06 AM

## 2013-10-13 NOTE — Discharge Summary (Signed)
Discharge Summary   Patient ID: Casey Huerta,  MRN: [id number], DOB/AGE: [birth year] 57 y.o.  Admit date: 10/11/2013 Discharge date: 10/13/2013  Primary Care Provider: No PCP Per Patient Primary Cardiologist: Dr. Stanford Breed  Discharge Diagnoses Principal Problem:   Chest pain Active Problems:   HYPERLIPIDEMIA   HYPERTENSION   CORONARY ARTERY DISEASE   COPD   Aortic insufficiency   Allergies Allergies  Allergen Reactions  . Simvastatin Other (See Comments)    REACTION: constipation    Procedures  Cardiac catheterization CARDIAC CATHETERIZATION  10/12/2013  HEMODYNAMICS:  Central Aorta: 155/65  Left Ventricle: 155/15  ANGIOGRAPHY:  Left main: Angiographically normal vessel, which bifurcated into the LAD and left circumflex coronary artery  LAD: Smooth 20% ostial narrowing. The LAD was otherwise free of significant obstructive disease. There was retrograde filling of the LIMA graft up to its proximal segment. The distal LAD extended to and wrapped around the LV apex.  Left circumflex: Angiographically normal vessel which gave rise to 2 major marginal branches..  Right coronary artery: Normal vessel which supplied the posterior descending artery.  LIMA to LAD: This vessel is atretic. When injected selectively from the left subclavian. The vessel was filled predominantly via retrograde filling from the native LAD.  Left ventriculography revealed normal left ventricular systolic function. There were no segmental wall motion abnormalities. There was no mitral regurgitation.  IMPRESSION:  Normal left ventricular function with an ejection fraction of approximately 55%.  No significant obstructive native coronary artery disease with mild smooth 20% ostial LAD narrowing.  Atretic LIMA graft arising from the subclavian with predominant retrograde filling of the graft via the LAD injection.  RECOMMENDATION:  Medical therapy.     Hospital Course  The patient is a 59 year old  African American female with past medical history significant for coronary artery disease status post single-vessel CABG with LIMA to LAD in 2001. Her last cardiac catheterization in March 2008 showed ejection fraction 63%. She was previously seen by Dr. Stanford Breed in the clinic in January 2013, at which time outpatient nuclear stress test was ordered, however patient was lost to followup due to financial reasons. It was also noted that patient has not had any medication for more than a year. Patient presented to the clinic on 10/12/2013 with complaint of intermittent substernal chest discomfort often occurring at rest which has been going on for the last 6 months and progressively worsening. Her symptom was concerning for unstable angina and she was admitted from the office for potential cardiac catheterization. Laboratory finding include potassium of 3.5, creatinine of 1.06, cholesterol 171, triglyceride of 241 and normal CBC. Hemoglobin A1c was 5.9.  She underwent the planned cardiac catheterization on 10/12/2013 which revealed EF 55%, 20% smooth ostial LAD narrowing, atretic LIMA graft arising from subclavian with predominant retrograde filling of the graft with LAD ejection. Medical therapy was recommended. Patient was placed on Coreg and gradually titrated up. She was also placed on aspirin and Lipitor. Post cardiac catheterization, she was kept overnight for observation. She has some mild nausea in the morning of 10/13/2013, which was resolved prior to discharge. Husband and patient are concerned the nausea will returned, I have given patient 10 tabs of Zofran in case nausea does return. Patient is considered stable for discharge from cardiology perspective. She will have a post cath followup in the office with Dr. Jacalyn Lefevre APP, during which visit, her blood pressure medication will be further adjusted. Afterwards, she will followup with Dr. Stanford Breed.    Discharge Vitals Blood  pressure 124/65, pulse 70,  temperature 98.2 F (36.8 C), temperature source Oral, resp. rate 18, height 5\' 2"  (1.575 m), weight 144 lb 14.4 oz (65.726 kg), SpO2 95.00%.  Filed Weights   10/11/13 1820 10/12/13 0500 10/13/13 0651  Weight: 145 lb (65.772 kg) 144 lb 13.5 oz (65.7 kg) 144 lb 14.4 oz (65.726 kg)    Labs  CBC  Recent Labs  10/11/13 2034 10/12/13 0110  WBC 5.6 5.2  NEUTROABS 2.5  --   HGB 12.4 12.8  HCT 37.0 38.4  MCV 88.5 88.9  PLT 217 150   Basic Metabolic Panel  Recent Labs  10/11/13 2034 10/12/13 0110  NA 141 141  K 3.5* 4.3  CL 104 104  CO2 25 26  GLUCOSE 141* 101*  BUN 13 14  CREATININE 1.06 1.15*  CALCIUM 9.3 9.4   Liver Function Tests  Recent Labs  10/11/13 2034  AST 18  ALT 16  ALKPHOS 77  BILITOT 0.2*  PROT 6.8  ALBUMIN 3.4*   Hemoglobin A1C  Recent Labs  10/11/13 2034  HGBA1C 5.9*   Fasting Lipid Panel  Recent Labs  10/12/13 0110  CHOL 171  HDL 53  LDLCALC 70  TRIG 241*  CHOLHDL 3.2    Disposition  Pt is being discharged home today in good condition.  Follow-up Plans & Appointments      Follow-up Information   Follow up with HAGER, BRYAN, PA-C On 11/12/2013. (10:00am)    Specialty:  Physician Assistant   Contact information:   [address] [place] [address] [phone]       Discharge Medications    Medication List         aspirin 81 MG tablet  Take 81 mg by mouth daily.     atorvastatin 40 MG tablet  Commonly known as:  LIPITOR  Take 1 tablet (40 mg total) by mouth daily at 6 PM.     carvedilol 6.25 MG tablet  Commonly known as:  COREG  Take 1 tablet (6.25 mg total) by mouth 2 (two) times daily with a meal.     multivitamin with minerals Tabs tablet  Take 1 tablet by mouth daily.     nitroGLYCERIN 0.4 MG SL tablet  Commonly known as:  NITROSTAT  Place 0.4 mg under the tongue every 5 (five) minutes as needed for chest pain.     ondansetron 4 MG tablet  Commonly known as:  ZOFRAN  Take 1 tablet  (4 mg total) by mouth every 8 (eight) hours as needed for nausea or vomiting.         Duration of Discharge Encounter   Greater than 30 minutes including physician time.  Hilbert Corrigan PA-C Pager: [phone] 10/13/2013, 3:01 PM

## 2013-10-18 ENCOUNTER — Encounter: Payer: Self-pay | Admitting: Cardiovascular Disease

## 2013-11-12 ENCOUNTER — Ambulatory Visit (INDEPENDENT_AMBULATORY_CARE_PROVIDER_SITE_OTHER): Payer: Managed Care, Other (non HMO) | Admitting: Physician Assistant

## 2013-11-12 ENCOUNTER — Encounter: Payer: Self-pay | Admitting: Physician Assistant

## 2013-11-12 VITALS — BP 134/80 | HR 68 | Ht 62.0 in | Wt 144.8 lb

## 2013-11-12 DIAGNOSIS — I359 Nonrheumatic aortic valve disorder, unspecified: Secondary | ICD-10-CM

## 2013-11-12 DIAGNOSIS — E785 Hyperlipidemia, unspecified: Secondary | ICD-10-CM

## 2013-11-12 DIAGNOSIS — I351 Nonrheumatic aortic (valve) insufficiency: Secondary | ICD-10-CM

## 2013-11-12 DIAGNOSIS — R079 Chest pain, unspecified: Secondary | ICD-10-CM

## 2013-11-12 DIAGNOSIS — I2510 Atherosclerotic heart disease of native coronary artery without angina pectoris: Secondary | ICD-10-CM

## 2013-11-12 DIAGNOSIS — I10 Essential (primary) hypertension: Secondary | ICD-10-CM

## 2013-11-12 NOTE — Assessment & Plan Note (Signed)
No further angina since discharge. The nausea she was experiencing prior to discharge resolved as well.

## 2013-11-12 NOTE — Assessment & Plan Note (Signed)
Blood pressure controlled. 

## 2013-11-12 NOTE — Assessment & Plan Note (Signed)
Continue on Lipitor.  Her cholesterol is controlled, however, her triglycerides are elevated. We discussed decreasing carbohydrate intake. She apparently eats a lot of potatoes, chips and "snacky" foods. Discussed this at length along with adding the foods which are high in omega-3 fatty acids.  Lipid Panel     Component Value Date/Time   CHOL 171 10/12/2013 0110   TRIG 241* 10/12/2013 0110   HDL 53 10/12/2013 0110   CHOLHDL 3.2 10/12/2013 0110   VLDL 48* 10/12/2013 0110   LDLCALC 70 10/12/2013 0110

## 2013-11-12 NOTE — Patient Instructions (Signed)
1.  Follow up with Dr. Stanford Breed in 3 Months. 2.  Start making dietary changes like we discussed.  Flax seed, Chia, low carbohydrate diet.  No chips!!!

## 2013-11-12 NOTE — Progress Notes (Signed)
Date:  11/12/2013   ID:  Casey Huerta, DOB 28-May-1955, MRN [id number]  PCP:  No PCP Per Patient  Primary Cardiologist:  Stanford Breed    History of Present Illness: Casey Huerta is a 58 y.o. female  The patient is a 58 year old African American female with past medical history significant for coronary artery disease status post single-vessel CABG with LIMA to LAD in 2001. Her last cardiac catheterization in March 2008 showed ejection fraction 63%. She was previously seen by Dr. Stanford Breed in the clinic in January 2013, at which time outpatient nuclear stress test was ordered, however patient was lost to followup due to financial reasons. It was also noted that patient has not had any medication for more than a year. Patient presented to the clinic on 10/12/2013 with complaint of intermittent substernal chest discomfort often occurring at rest which has been going on for the last 6 months and progressively worsening. Her symptom was concerning for unstable angina and she was admitted from the office for potential cardiac catheterization. Laboratory finding include potassium of 3.5, creatinine of 1.06, cholesterol 171, triglyceride of 241 and normal CBC. Hemoglobin A1c was 5.9.   She underwent the planned cardiac catheterization on 10/12/2013 which revealed EF 55%, 20% smooth ostial LAD narrowing, atretic LIMA graft arising from subclavian with predominant retrograde filling of the graft with LAD ejection. Medical therapy was recommended. Patient was placed on Coreg and gradually titrated up. She was also placed on aspirin and Lipitor.  Patient presents today for posthospital evaluation. She reports doing well with no further chest pain or nausea. She also  denies vomiting, fever, shortness of breath, orthopnea, dizziness, PND, cough, congestion, abdominal pain, hematochezia, melena, lower extremity edema, claudication.  Wt Readings from Last 3 Encounters:  11/12/13 144 lb 12.8 oz (65.681 kg)    10/13/13 144 lb 14.4 oz (65.726 kg)  10/13/13 144 lb 14.4 oz (65.726 kg)     Past Medical History  Diagnosis Date  . HYPERLIPIDEMIA   . HYPERTENSION   . CORONARY ARTERY DISEASE     a. CABG (LIMA-->LAD 2001). b. Myoview 2008 no ischemia, EF 63%.  c. cath 10/14/2013 mild 20% ost LAD, atretic LIMA, EF 55%, medical therapy  . COPD   . MENORRHAGIA   . Aortic insufficiency     a. Mild-mod AI by echo 2010.    Current Outpatient Prescriptions  Medication Sig Dispense Refill  . aspirin 81 MG tablet Take 81 mg by mouth daily.      Marland Kitchen atorvastatin (LIPITOR) 40 MG tablet Take 1 tablet (40 mg total) by mouth daily at 6 PM.  30 tablet  12  . carvedilol (COREG) 6.25 MG tablet Take 1 tablet (6.25 mg total) by mouth 2 (two) times daily with a meal.  60 tablet  12  . Multiple Vitamin (MULTIVITAMIN WITH MINERALS) TABS Take 1 tablet by mouth daily.      . ondansetron (ZOFRAN) 4 MG tablet Take 1 tablet (4 mg total) by mouth every 8 (eight) hours as needed for nausea or vomiting.  10 tablet  0   No current facility-administered medications for this visit.    Allergies:    Allergies  Allergen Reactions  . Simvastatin Other (See Comments)    REACTION: constipation    Social History:  The patient  reports that she has quit smoking. She has never used smokeless tobacco. She reports that she drinks alcohol. She reports that she does not use illicit drugs.   Family history:  Family History  Problem Relation Age of Onset  . Heart disease Mother   . Heart disease Sister     ROS:  Please see the history of present illness.  All other systems reviewed and negative.   PHYSICAL EXAM: VS:  BP 134/80  Pulse 68  Ht 5\' 2"  (1.575 m)  Wt 144 lb 12.8 oz (65.681 kg)  BMI 26.48 kg/m2 Mildly overweight, well developed, in no acute distress HEENT: Pupils are equal round react to light accommodation extraocular movements are intact.  Neck: no JVDNo cervical lymphadenopathy. Cardiac: Regular rate and rhythm  without murmurs rubs or gallops. Lungs:  clear to auscultation bilaterally, no wheezing, rhonchi or rales Abd: soft, nontender, positive bowel sounds all quadrants, no hepatosplenomegaly Ext: no lower extremity edema.  2+ radial and dorsalis pedis pulses. Skin: warm and dry Neuro:  Grossly normal  EKG:  None  ASSESSMENT AND PLAN:  Problem List Items Addressed This Visit   HYPERLIPIDEMIA - Primary      Continue on Lipitor.  Her cholesterol is controlled, however, her triglycerides are elevated. We discussed decreasing carbohydrate intake. She apparently eats a lot of potatoes, chips and "snacky" foods. Discussed this at length along with adding the foods which are high in omega-3 fatty acids.  Lipid Panel     Component Value Date/Time   CHOL 171 10/12/2013 0110   TRIG 241* 10/12/2013 0110   HDL 53 10/12/2013 0110   CHOLHDL 3.2 10/12/2013 0110   VLDL 48* 10/12/2013 0110   LDLCALC 70 10/12/2013 0110        HYPERTENSION     Blood pressure controlled.    CORONARY ARTERY DISEASE     Last cardiac cath 10/12/2013 Normal left ventricular function with an ejection fraction of approximately 55%.  No significant obstructive native coronary artery disease with mild smooth 20% ostial LAD narrowing.  Atretic LIMA graft arising from the subclavian with predominant retrograde filling of the graft via the LAD injection.      Chest pain     No further angina since discharge. The nausea she was experiencing prior to discharge resolved as well.    Aortic insufficiency     Mild to moderate on previous echo which was 2010. No murmur on exam.

## 2013-11-12 NOTE — Assessment & Plan Note (Signed)
Mild to moderate on previous echo which was 2010. No murmur on exam.

## 2013-11-12 NOTE — Assessment & Plan Note (Signed)
Last cardiac cath 10/12/2013 Normal left ventricular function with an ejection fraction of approximately 55%.  No significant obstructive native coronary artery disease with mild smooth 20% ostial LAD narrowing.  Atretic LIMA graft arising from the subclavian with predominant retrograde filling of the graft via the LAD injection.

## 2014-01-27 ENCOUNTER — Encounter (HOSPITAL_COMMUNITY): Payer: Self-pay | Admitting: Cardiovascular Disease

## 2014-01-27 NOTE — Progress Notes (Signed)
      HPI: FU coronary artery disease. She is status post coronary artery bypass and graft in 2001. Last Myoview in March of 2008 showed an ejection fraction of 63% and no ischemia. Abdominal ultrasound in February 2008 showed no aneurysm. Echocardiogram in March of 2010 showed normal LV function, mild to moderate aortic insufficiency and mild mitral regurgitation. Cath 8/15 showed nl LM, 20 LAD, nl Lcx, nl RCA, atretic LIMA to LAD, nl LV function. I last saw her in February 2010. Since last seen,   Current Outpatient Prescriptions  Medication Sig Dispense Refill  . aspirin 81 MG tablet Take 81 mg by mouth daily.    Marland Kitchen atorvastatin (LIPITOR) 40 MG tablet Take 1 tablet (40 mg total) by mouth daily at 6 PM. 30 tablet 12  . carvedilol (COREG) 6.25 MG tablet Take 1 tablet (6.25 mg total) by mouth 2 (two) times daily with a meal. 60 tablet 12  . Multiple Vitamin (MULTIVITAMIN WITH MINERALS) TABS Take 1 tablet by mouth daily.    . ondansetron (ZOFRAN) 4 MG tablet Take 1 tablet (4 mg total) by mouth every 8 (eight) hours as needed for nausea or vomiting. 10 tablet 0   No current facility-administered medications for this visit.     Past Medical History  Diagnosis Date  . HYPERLIPIDEMIA   . HYPERTENSION   . CORONARY ARTERY DISEASE     a. CABG (LIMA-->LAD 2001). b. Myoview 2008 no ischemia, EF 63%.  c. cath 10/14/2013 mild 20% ost LAD, atretic LIMA, EF 55%, medical therapy  . COPD   . MENORRHAGIA   . Aortic insufficiency     a. Mild-mod AI by echo 2010.    Past Surgical History  Procedure Laterality Date  . Coronary artery bypass graft    . Arthroscopic knee surgery    . Cesarean section      History   Social History  . Marital Status: Married    Spouse Name: N/A    Number of Children: N/A  . Years of Education: N/A   Occupational History  . Not on file.   Social History Main Topics  . Smoking status: Former Research scientist (life sciences)  . Smokeless tobacco: Never Used     Comment: QUIT SMOKING IN  1999  . Alcohol Use: Yes     Comment: Occasional  . Drug Use: No     Comment:  quit using in 1985  . Sexual Activity: Not on file   Other Topics Concern  . Not on file   Social History Narrative  . No narrative on file    ROS: no fevers or chills, productive cough, hemoptysis, dysphasia, odynophagia, melena, hematochezia, dysuria, hematuria, rash, seizure activity, orthopnea, PND, pedal edema, claudication. Remaining systems are negative.  Physical Exam: Well-developed well-nourished in no acute distress.  Skin is warm and dry.  HEENT is normal.  Neck is supple.  Chest is clear to auscultation with normal expansion.  Cardiovascular exam is regular rate and rhythm.  Abdominal exam nontender or distended. No masses palpated. Extremities show no edema. neuro grossly intact  ECG     This encounter was created in error - please disregard.

## 2014-01-31 ENCOUNTER — Encounter: Payer: Managed Care, Other (non HMO) | Admitting: Cardiology

## 2014-10-17 ENCOUNTER — Other Ambulatory Visit: Payer: Self-pay | Admitting: Physician Assistant

## 2014-10-18 NOTE — Telephone Encounter (Signed)
Please review for refill, [place] pt. 

## 2014-11-07 ENCOUNTER — Encounter: Payer: Self-pay | Admitting: Family Medicine

## 2014-11-07 ENCOUNTER — Ambulatory Visit (INDEPENDENT_AMBULATORY_CARE_PROVIDER_SITE_OTHER): Payer: Managed Care, Other (non HMO) | Admitting: Family Medicine

## 2014-11-07 ENCOUNTER — Encounter (HOSPITAL_COMMUNITY): Payer: Self-pay

## 2014-11-07 ENCOUNTER — Emergency Department (HOSPITAL_COMMUNITY): Payer: Managed Care, Other (non HMO)

## 2014-11-07 ENCOUNTER — Emergency Department (HOSPITAL_COMMUNITY)
Admission: EM | Admit: 2014-11-07 | Discharge: 2014-11-07 | Disposition: A | Discharge status: Home / Self Care | Payer: Managed Care, Other (non HMO) | Attending: Emergency Medicine | Admitting: Emergency Medicine

## 2014-11-07 VITALS — BP 168/82 | HR 74 | Temp 97.8°F | Resp 18 | Wt 143.8 lb

## 2014-11-07 DIAGNOSIS — I2510 Atherosclerotic heart disease of native coronary artery without angina pectoris: Secondary | ICD-10-CM | POA: Insufficient documentation

## 2014-11-07 DIAGNOSIS — E785 Hyperlipidemia, unspecified: Secondary | ICD-10-CM | POA: Insufficient documentation

## 2014-11-07 DIAGNOSIS — Z7982 Long term (current) use of aspirin: Secondary | ICD-10-CM | POA: Insufficient documentation

## 2014-11-07 DIAGNOSIS — R1031 Right lower quadrant pain: Secondary | ICD-10-CM | POA: Diagnosis present

## 2014-11-07 DIAGNOSIS — I10 Essential (primary) hypertension: Secondary | ICD-10-CM | POA: Insufficient documentation

## 2014-11-07 DIAGNOSIS — Z87891 Personal history of nicotine dependence: Secondary | ICD-10-CM | POA: Diagnosis not present

## 2014-11-07 DIAGNOSIS — Z79899 Other long term (current) drug therapy: Secondary | ICD-10-CM | POA: Insufficient documentation

## 2014-11-07 DIAGNOSIS — J449 Chronic obstructive pulmonary disease, unspecified: Secondary | ICD-10-CM | POA: Insufficient documentation

## 2014-11-07 DIAGNOSIS — R1084 Generalized abdominal pain: Secondary | ICD-10-CM

## 2014-11-07 DIAGNOSIS — Z951 Presence of aortocoronary bypass graft: Secondary | ICD-10-CM | POA: Insufficient documentation

## 2014-11-07 DIAGNOSIS — R079 Chest pain, unspecified: Secondary | ICD-10-CM | POA: Diagnosis not present

## 2014-11-07 DIAGNOSIS — R1011 Right upper quadrant pain: Secondary | ICD-10-CM

## 2014-11-07 DIAGNOSIS — D251 Intramural leiomyoma of uterus: Secondary | ICD-10-CM | POA: Insufficient documentation

## 2014-11-07 DIAGNOSIS — Z9889 Other specified postprocedural states: Secondary | ICD-10-CM | POA: Insufficient documentation

## 2014-11-07 DIAGNOSIS — K5730 Diverticulosis of large intestine without perforation or abscess without bleeding: Secondary | ICD-10-CM | POA: Diagnosis not present

## 2014-11-07 LAB — POCT URINALYSIS DIPSTICK
Bilirubin, UA: NEGATIVE
Glucose, UA: NEGATIVE
Ketones, UA: NEGATIVE
NITRITE UA: NEGATIVE
PROTEIN UA: NEGATIVE
SPEC GRAV UA: 1.015
UROBILINOGEN UA: 0.2
pH, UA: 5

## 2014-11-07 LAB — I-STAT TROPONIN, ED
TROPONIN I, POC: 0 ng/mL (ref 0.00–0.08)
Troponin i, poc: 0 ng/mL (ref 0.00–0.08)

## 2014-11-07 LAB — POCT UA - MICROSCOPIC ONLY
Crystals, Ur, HPF, POC: NEGATIVE
Mucus, UA: POSITIVE
Yeast, UA: NEGATIVE

## 2014-11-07 LAB — COMPREHENSIVE METABOLIC PANEL
ALBUMIN: 4.1 g/dL (ref 3.5–5.0)
ALT: 21 U/L (ref 14–54)
AST: 23 U/L (ref 15–41)
Alkaline Phosphatase: 75 U/L (ref 38–126)
Anion gap: 7 (ref 5–15)
BUN: 14 mg/dL (ref 6–20)
CHLORIDE: 106 mmol/L (ref 101–111)
CO2: 26 mmol/L (ref 22–32)
Calcium: 9.6 mg/dL (ref 8.9–10.3)
Creatinine, Ser: 0.94 mg/dL (ref 0.44–1.00)
GFR calc Af Amer: >60 mL/min (ref >60)
Glucose, Bld: 109 mg/dL — ABNORMAL HIGH (ref 65–99)
POTASSIUM: 4.3 mmol/L (ref 3.5–5.1)
Sodium: 139 mmol/L (ref 135–145)
Total Bilirubin: 0.7 mg/dL (ref 0.3–1.2)
Total Protein: 7.8 g/dL (ref 6.5–8.1)

## 2014-11-07 LAB — CBC
HEMATOCRIT: 41.3 % (ref 36.0–46.0)
Hemoglobin: 13.5 g/dL (ref 12.0–15.0)
MCH: 29.5 pg (ref 26.0–34.0)
MCHC: 32.7 g/dL (ref 30.0–36.0)
MCV: 90.4 fL (ref 78.0–100.0)
Platelets: 194 10*3/uL (ref 150–400)
RBC: 4.57 MIL/uL (ref 3.87–5.11)
RDW: 12.3 % (ref 11.5–15.5)
WBC: 4.7 10*3/uL (ref 4.0–10.5)

## 2014-11-07 LAB — I-STAT CREATININE, ED: Creatinine, Ser: 1.1 mg/dL — ABNORMAL HIGH (ref 0.44–1.00)

## 2014-11-07 LAB — LIPASE, BLOOD: LIPASE: 27 U/L (ref 22–51)

## 2014-11-07 MED ORDER — METOCLOPRAMIDE HCL 10 MG PO TABS: 5.0000 mg | ORAL_TABLET | Freq: Four times a day (QID) | ORAL | Status: DC

## 2014-11-07 MED ORDER — HYDROMORPHONE HCL 1 MG/ML IJ SOLN
0.5000 mg | INTRAMUSCULAR | Status: DC | PRN
  Administered 2014-11-07: 0.5 mg via INTRAVENOUS
  Filled 2014-11-07: qty 1

## 2014-11-07 MED ORDER — METOCLOPRAMIDE HCL 10 MG PO TABS
5.0000 mg | ORAL_TABLET | Freq: Four times a day (QID) | ORAL | Status: DC | PRN
  Administered 2014-11-07: 5 mg via ORAL
  Filled 2014-11-07: qty 1

## 2014-11-07 MED ORDER — ASPIRIN 81 MG PO CHEW: 324.0000 mg | CHEWABLE_TABLET | Freq: Once | ORAL | Status: DC

## 2014-11-07 MED ORDER — METOCLOPRAMIDE HCL 5 MG/ML IJ SOLN
10.0000 mg | Freq: Once | INTRAMUSCULAR | Status: AC
  Administered 2014-11-07: 10 mg via INTRAMUSCULAR
  Filled 2014-11-07: qty 2

## 2014-11-07 MED ORDER — ONDANSETRON HCL 4 MG/2ML IJ SOLN
4.0000 mg | Freq: Once | INTRAMUSCULAR | Status: AC
  Administered 2014-11-07: 4 mg via INTRAVENOUS
  Filled 2014-11-07: qty 2

## 2014-11-07 MED ORDER — IOHEXOL 300 MG/ML  SOLN
100.0000 mL | Freq: Once | INTRAMUSCULAR | Status: AC | PRN
  Administered 2014-11-07: 100 mL via INTRAVENOUS

## 2014-11-07 MED ORDER — SODIUM CHLORIDE 0.9 % IV SOLN
Freq: Once | INTRAVENOUS | Status: AC
  Administered 2014-11-07: 16:00:00 via INTRAVENOUS

## 2014-11-07 MED ORDER — IOHEXOL 300 MG/ML  SOLN: 25.0000 mL | Freq: Once | INTRAMUSCULAR | Status: DC | PRN

## 2014-11-07 MED ORDER — HYDROCODONE-ACETAMINOPHEN 5-325 MG PO TABS: 2.0000 | ORAL_TABLET | ORAL | Status: DC | PRN

## 2014-11-07 NOTE — ED Notes (Signed)
PT states she was given ASA 4 tabs just prior to leaving the doctors office today

## 2014-11-07 NOTE — Progress Notes (Signed)
Subjective:    Patient ID: Casey Huerta, female    DOB: 29-Nov-1955, 59 y.o.   MRN: [id number]  HPI This is a pleasant 59 yo female who presents today with intermittent right sided pain for 6 days. She doesn't think it radiates to her back, but finds the pain hard to describe. She has had some intermittent sharp chest pains with the abdominal pain. Has hot flashes and is not sure if she has had diaphoresis with the episode. Has nausea, no vomiting, some right sided hand pain which is not new. No jaw or shoulder pain. No SOB.   Has not had recent regular care, reports she is afraid there is something wrong with her heart.   Past Medical History  Diagnosis Date  . HYPERLIPIDEMIA   . HYPERTENSION   . CORONARY ARTERY DISEASE     a. CABG (LIMA-->LAD 2001). b. Myoview 2008 no ischemia, EF 63%.  c. cath 10/14/2013 mild 20% ost LAD, atretic LIMA, EF 55%, medical therapy  . COPD   . MENORRHAGIA   . Aortic insufficiency     a. Mild-mod AI by echo 2010.   Past Surgical History  Procedure Laterality Date  . Coronary artery bypass graft    . Arthroscopic knee surgery    . Cesarean section    . Left heart catheterization with coronary/graft angiogram N/A 10/12/2013    Procedure: LEFT HEART CATHETERIZATION WITH Beatrix Fetters;  Surgeon: Troy Sine, MD;  Location: [facility] CATH LAB;  Service: Cardiovascular;  Laterality: N/A;   Family History  Problem Relation Age of Onset  . Heart disease Mother   . Heart disease Sister    Social History  Substance Use Topics  . Smoking status: Former Research scientist (life sciences)  . Smokeless tobacco: Never Used     Comment: QUIT SMOKING IN 1999  . Alcohol Use: Yes     Comment: Occasional   Review of Systems     Objective:   Physical Exam  Constitutional: She is oriented to person, place, and time. She appears well-developed and well-nourished. She appears distressed.  Patient intermittently mildly distressed, nauseous, complaining of upper left sided chest  pain.   HENT:  Head: Normocephalic and atraumatic.  Eyes: Conjunctivae are normal.  Neck: Normal range of motion.  Cardiovascular: Normal rate, regular rhythm and normal heart sounds.   Pulmonary/Chest: Effort normal and breath sounds normal.  Abdominal: Soft. Bowel sounds are normal. She exhibits no distension and no mass. There is no tenderness. There is no rebound and no guarding.  Neurological: She is alert and oriented to person, place, and time.  Skin: Skin is warm. She is diaphoretic (slightly diaphoretic.).  Psychiatric: She has a normal mood and affect. Her behavior is normal. Judgment and thought content normal.   BP 162/78 mmHg  Pulse 74  Temp(Src) 97.8 F (36.6 C) (Oral)  Resp 18  Wt 143 lb 12.8 oz (65.227 kg) Wt Readings from Last 3 Encounters:  11/07/14 143 lb 12.8 oz (65.227 kg)  11/12/13 144 lb 12.8 oz (65.681 kg)  10/13/13 144 lb 14.4 oz (65.726 kg)   Depression screen PHQ 2/9 11/07/2014  Decreased Interest 0  Down, Depressed, Hopeless 0  PHQ - 2 Score 0      Assessment & Plan:  Discussed with Dr. Tamala Julian who also examined patient 1. Right upper quadrant pain - POCT UA - Microscopic Only - POCT urinalysis dipstick  2. Chest pain, unspecified chest pain type - EKG 12-Lead - aspirin chewable tablet 324 mg;  Chew 4 tablets (324 mg total) by mouth once- given.  - patient transported via EMS to [facility] ED  Clarene Reamer, FNP-BC  [facility]  11/07/2014 10:36 AM

## 2014-11-07 NOTE — ED Provider Notes (Signed)
CSN: [id number]     Arrival date & time 11/07/14  1059 History   First MD Initiated Contact with Patient 11/07/14 1515     Chief Complaint  Patient presents with  . Abdominal Pain     (Consider location/radiation/quality/duration/timing/severity/associated sxs/prior Treatment) HPI Comments: Pain ini R flank that has increased in intesnity and frequency over the past 6 days now pain in entire abdomen, chest and L arm  + nausea today but no vomiting.  Went to PCP who sent her to ED fro further evaluation. Pain comes and goes is stabbing lasting  Several seconds at a time. She has been taking tylenol with no relief. Reports NO dysuria, constipation, diarrhea, fever  Hx of CABG. HTN and Hyperlipidemia   Patient is a 59 y.o. female presenting with abdominal pain. The history is provided by the patient.  Abdominal Pain Pain location:  RUQ, RLQ and suprapubic Pain quality: sharp and shooting   Pain radiates to:  R flank and chest Pain severity:  Moderate Onset quality:  Gradual Duration:  6 days Timing:  Intermittent Progression:  Worsening Chronicity:  New Context: not alcohol use, not awakening from sleep, not diet changes, not eating, not laxative use, not medication withdrawal, not previous surgeries, not recent illness, not recent sexual activity, not recent travel, not retching, not sick contacts, not suspicious food intake and not trauma   Relieved by:  None tried Worsened by:  Nothing tried Ineffective treatments:  None tried Associated symptoms: chest pain and nausea   Associated symptoms: no chills, no constipation, no cough, no diarrhea, no dysuria, no fatigue, no fever, no flatus, no hematuria, no shortness of breath, no vaginal discharge and no vomiting   Chest pain:    Quality:  Dull   Severity:  Mild   Onset quality:  Gradual   Duration:  1 day   Timing:  Intermittent   Progression:  Unchanged   Chronicity:  Recurrent Risk factors: aspirin   Risk factors: no alcohol  abuse, not elderly, has not had multiple surgeries, no NSAID use, not obese, not pregnant and no recent hospitalization     Past Medical History  Diagnosis Date  . HYPERLIPIDEMIA   . HYPERTENSION   . CORONARY ARTERY DISEASE     a. CABG (LIMA-->LAD 2001). b. Myoview 2008 no ischemia, EF 63%.  c. cath 10/14/2013 mild 20% ost LAD, atretic LIMA, EF 55%, medical therapy  . COPD   . MENORRHAGIA   . Aortic insufficiency     a. Mild-mod AI by echo 2010.   Past Surgical History  Procedure Laterality Date  . Coronary artery bypass graft    . Arthroscopic knee surgery    . Cesarean section    . Left heart catheterization with coronary/graft angiogram N/A 10/12/2013    Procedure: LEFT HEART CATHETERIZATION WITH Beatrix Fetters;  Surgeon: Troy Sine, MD;  Location: [facility] CATH LAB;  Service: Cardiovascular;  Laterality: N/A;   Family History  Problem Relation Age of Onset  . Heart disease Mother   . Heart disease Sister    Social History  Substance Use Topics  . Smoking status: Former Research scientist (life sciences)  . Smokeless tobacco: Never Used     Comment: QUIT SMOKING IN 1999  . Alcohol Use: Yes     Comment: Occasional   OB History    No data available     Review of Systems  Constitutional: Negative for fever, chills and fatigue.  HENT: Negative.   Eyes: Negative.   Respiratory: Negative  for cough and shortness of breath.   Cardiovascular: Positive for chest pain. Negative for palpitations and leg swelling.  Gastrointestinal: Positive for nausea and abdominal pain. Negative for vomiting, diarrhea, constipation, blood in stool, abdominal distention, rectal pain and flatus.  Genitourinary: Negative for dysuria, hematuria and vaginal discharge.  Musculoskeletal: Negative.   Skin: Negative.   Allergic/Immunologic: Negative.   Neurological: Negative for dizziness and headaches.  Psychiatric/Behavioral: Negative.   All other systems reviewed and are negative.     Allergies   Simvastatin  Home Medications   Prior to Admission medications   Medication Sig Start Date End Date Taking? Authorizing Provider  aspirin 81 MG tablet Take 81 mg by mouth daily.   Yes Historical Provider, MD  atorvastatin (LIPITOR) 40 MG tablet Take 1 tablet (40 mg total) by mouth daily at 6 PM. 10/13/13  Yes Almyra Deforest, PA  Multiple Vitamin (MULTIVITAMIN WITH MINERALS) TABS Take 1 tablet by mouth daily.   Yes Historical Provider, MD  carvedilol (COREG) 6.25 MG tablet TAKE 1 TABLET (6.25 MG TOTAL) BY MOUTH 2 (TWO) TIMES DAILY WITH A MEAL. Patient not taking: Reported on 11/07/2014 10/18/14   Lelon Perla, MD  HYDROcodone-acetaminophen (NORCO/VICODIN) 5-325 MG per tablet Take 2 tablets by mouth every 4 (four) hours as needed. 11/07/14   Junius Creamer, NP   BP 148/61 mmHg  Pulse 67  Temp(Src) 98.6 F (37 C) (Oral)  Resp 15  SpO2 95% Physical Exam  Constitutional: She appears well-developed and well-nourished.  HENT:  Head: Normocephalic.  Eyes: Pupils are equal, round, and reactive to light.  Neck: Normal range of motion.  Cardiovascular: Normal rate and regular rhythm.   Pulmonary/Chest: Effort normal and breath sounds normal.  Abdominal: Soft. She exhibits no distension. There is tenderness in the right upper quadrant, right lower quadrant, suprapubic area and left lower quadrant. There is tenderness at McBurney's point. There is no rebound and no CVA tenderness.    Nursing note and vitals reviewed.   ED Course  Procedures (including critical care time) Labs Review Labs Reviewed  COMPREHENSIVE METABOLIC PANEL - Abnormal; Notable for the following:    Glucose, Bld 109 (*)    All other components within normal limits  I-STAT CREATININE, ED - Abnormal; Notable for the following:    Creatinine, Ser 1.10 (*)    All other components within normal limits  LIPASE, BLOOD  CBC  I-STAT TROPOININ, ED  I-STAT TROPOININ, ED  I-STAT TROPOININ, ED    Imaging Review Ct Abdomen Pelvis  W Contrast  11/07/2014   CLINICAL DATA:  Six day history of intermittent right upper and lower quadrant abdominal pain associated with nausea. Tenderness to palpation on clinical examination in the right side of the abdomen. No prior abdominal surgical history.  EXAM: CT ABDOMEN AND PELVIS WITH CONTRAST  TECHNIQUE: Multidetector CT imaging of the abdomen and pelvis was performed using the standard protocol following bolus administration of intravenous contrast.  CONTRAST:  144mL OMNIPAQUE IOHEXOL 300 MG/ML IV. Oral contrast was also administered.  COMPARISON:  01/29/2009.  FINDINGS: Hepatobiliary: Small cysts in the right lobe of the liver measuring 5 mm and 6 mm, unchanged since the prior CT. No significant focal parenchymal abnormality involving the liver. Normal-appearing gallbladder without calcified gallstones. No biliary ductal dilation.  Spleen:  Normal in size and appearance.  Pancreas:  Normal in appearance.  No pancreatic ductal dilation.  Adrenal glands:  Normal in appearance.  Genitourinary: Scattered areas of focal scarring involving both kidneys, unchanged. No  new abnormality involving either kidney. No hydronephrosis. No visible urinary tract calculi. Normal-appearing urinary bladder.  Multiple uterine fibroids, the largest a subserosal measuring approximately 3 cm arising from the anterior fundus. The fibroids have decreased in size since the prior CT. Normal-appearing ovaries.  Gastrointestinal: Stomach normal in appearance for degree of distention. Normal-appearing small bowel. Descending and sigmoid colon diverticulosis without evidence of acute diverticulitis. Remainder of the colon unremarkable with expected stool burden. Normal appearing long appendix in the right mid and lower pelvis.  Ascites:  Absent.  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 Visceral arteries patent, though there is stenosis at the origin of the IMA. A very small accessory right lower pole renal  artery is noted.  Lymphatic:  No pathologic lymphadenopathy in the abdomen or pelvis.  Other findings: None.  Musculoskeletal: Severe facet degenerative changes at L3-4, L4-5 and L5-S1. Mild degenerative disc disease and spondylosis at T11-12 and L1-2.  Visualized lower thorax: Heart size normal. Calcified granuloma deep in the lateral right lower lobe, unchanged. Visualized lung bases emphysematous and otherwise clear.  IMPRESSION: 1. No acute abnormalities involving the abdomen or pelvis. 2. Descending and sigmoid colon diverticulosis without evidence of acute diverticulitis. 3. Multiple uterine fibroids which have decreased in size since a prior CT from 2010. 4. 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   Electronically Signed   By: Evangeline Dakin M.D.   On: 11/07/2014 17:17   I have personally reviewed and evaluated these images and lab results as part of my medical decision-making.   EKG Interpretation None      MDM   Final diagnoses:  Generalized abdominal pain  Diverticulosis of large intestine without hemorrhage  Fibroids, intramural         Junius Creamer, NP 11/07/14 1940  Leonard Schwartz, MD 11/07/14 2318

## 2014-11-07 NOTE — ED Notes (Signed)
EKG just completed at MD office

## 2014-11-07 NOTE — ED Notes (Signed)
Pt ready for d/c and started vomiting clear yellow emesis, attempted to give Reglan with just a sip of water, pt vomited again. NP notified of the same, will administer IM and reassess.

## 2014-11-07 NOTE — ED Notes (Signed)
Per EMS, rt upper and lower abdominal pain.  Rib cage pain with cough.  Pain induced with palpation.  Also has headache.  Pt is from MD office.  Nausea with no vomiting.  Vitals: 146/70, hr 92, resp 18

## 2014-11-07 NOTE — ED Notes (Signed)
Patient transported to CT 

## 2014-11-07 NOTE — Discharge Instructions (Signed)
Your fibroids are decreasing in size, your appendix is normal You do have diverticular disease -- which is a condition like varicose veins in your  Intestines without any sign of infection  Your troponin- and enzyme.  Your heart produces when it is internal is 0 twice in 6 hours  which is very reassuring Abdominal Pain Many things can cause belly (abdominal) pain. Most times, the belly pain is not dangerous. Many cases of belly pain can be watched and treated at home. HOME CARE   Do not take medicines that help you go poop (laxatives) unless told to by your doctor.  Only take medicine as told by your doctor.  Eat or drink as told by your doctor. Your doctor will tell you if you should be on a special diet. GET HELP IF:  You do not know what is causing your belly pain.  You have belly pain while you are sick to your stomach (nauseous) or have runny poop (diarrhea).  You have pain while you pee or poop.  Your belly pain wakes you up at night.  You have belly pain that gets worse or better when you eat.  You have belly pain that gets worse when you eat fatty foods.  You have a fever. GET HELP RIGHT AWAY IF:   The pain does not go away within 2 hours.  You keep throwing up (vomiting).  The pain changes and is only in the right or left part of the belly.  You have bloody or tarry looking poop. MAKE SURE YOU:   Understand these instructions.  Will watch your condition.  Will get help right away if you are not doing well or get worse. Document Released: 07/24/2007 Document Revised: 02/09/2013 Document Reviewed: 10/14/2012 [facility] Patient Information 2015 [place], [place]. This information is not intended to replace advice given to you by your health care provider. Make sure you discuss any questions you have with your health care provider.

## 2014-11-09 ENCOUNTER — Telehealth: Payer: Self-pay

## 2014-11-09 NOTE — Telephone Encounter (Signed)
Patient is calling to request a refill for hydrocortizone sent [name] on [address]. Patient phone is [phone]

## 2014-11-09 NOTE — Telephone Encounter (Signed)
Spoke with pt, she would like a refill on Hydrocodone. I advised her that she should have to come back in for a follow up. She states she was unable to come in because her husband is not there and she has no transportation. She would like me to ask Neoma Laming for a refill. Let me know. Thanks. (I told pt unlikely).

## 2014-11-10 NOTE — Telephone Encounter (Signed)
Called Pt no answer, left VM to call back

## 2014-11-10 NOTE — Telephone Encounter (Signed)
If the patient continues to have pain requiring that strong of pain medicine, she needs to be seen again. That prescription was from the ED, so I will not refill.

## 2014-11-10 NOTE — Telephone Encounter (Signed)
Spoke with pt, advised message. Pt understood. 

## 2014-11-24 ENCOUNTER — Other Ambulatory Visit: Payer: Self-pay | Admitting: Cardiology

## 2014-12-11 NOTE — Progress Notes (Signed)
History and physical examinations obtained with FNP Carlean Purl.  Patient with known CAD s/p CABG with poor medical compliance now presenting with acute L sided chest pain and abdominal pain with nausea; EKG stable in office; transported emergently to ED to rule out acute coronary syndrome.  Four ASA 81mg  administered in office. Casey Huerta Elayne Guerin, M.D. [facility] [address] [place], [place]  [address] [phone] phone [phone] fax

## 2015-01-03 ENCOUNTER — Encounter: Payer: Self-pay | Admitting: Cardiology

## 2015-03-10 ENCOUNTER — Other Ambulatory Visit: Payer: Self-pay | Admitting: Cardiology

## 2015-04-13 ENCOUNTER — Telehealth: Payer: Self-pay | Admitting: Cardiology

## 2015-04-13 MED ORDER — CARVEDILOL 6.25 MG PO TABS: 6.2500 mg | ORAL_TABLET | Freq: Two times a day (BID) | ORAL | Status: DC

## 2015-04-13 NOTE — Telephone Encounter (Signed)
Unable to reach pt. I see that atenolol was discontinued in past and pt has been getting carvedilol.  Issued 30 day dispense w 0 refills and instruction to call and make appt.

## 2015-04-13 NOTE — Telephone Encounter (Signed)
New message       *STAT* If patient is at the pharmacy, call can be transferred to refill team.   1. Which medications need to be refilled? (please list name of each medication and dose if known) atenolol 25mg  2. Which pharmacy/location (including street and city if local pharmacy) is medication to be sent to? [facility] [address]. Do they need a 30 day or 90 day supply? 90 day

## 2015-11-13 ENCOUNTER — Telehealth: Payer: Self-pay | Admitting: *Deleted

## 2015-11-13 ENCOUNTER — Other Ambulatory Visit: Payer: Self-pay | Admitting: Cardiology

## 2015-11-13 MED ORDER — CARVEDILOL 6.25 MG PO TABS: 6.2500 mg | ORAL_TABLET | Freq: Two times a day (BID) | ORAL | 6 refills | Status: DC

## 2015-11-13 NOTE — Telephone Encounter (Signed)
Rx request sent to pharmacy.  

## 2015-11-13 NOTE — Telephone Encounter (Signed)
Patients husband, Mliss Fritz called and requested a refill for the patient. Neither him nor his wife knows the name of the medication, but they believe that it is atenolol. He requested that this be sent to [facility] on [address]. Patient can be reached at [phone]. Thanks, MI

## 2015-11-13 NOTE — Telephone Encounter (Signed)
Atenolol is not on pt medication list, leave message for pt to call back

## 2015-11-16 NOTE — Telephone Encounter (Signed)
Left message for pt to call.

## 2015-11-17 NOTE — Telephone Encounter (Signed)
LEFT DETAILED MESSAGE TO CALL BACK TO SE IF MEDICATION HAD BEEN REFILLED AS REQUESTED

## 2016-01-20 ENCOUNTER — Ambulatory Visit (INDEPENDENT_AMBULATORY_CARE_PROVIDER_SITE_OTHER): Payer: Managed Care, Other (non HMO) | Admitting: Physician Assistant

## 2016-01-20 VITALS — BP 152/88 | HR 74 | Temp 98.1°F | Resp 17 | Ht 62.0 in | Wt 135.0 lb

## 2016-01-20 DIAGNOSIS — Z23 Encounter for immunization: Secondary | ICD-10-CM

## 2016-01-20 DIAGNOSIS — I25810 Atherosclerosis of coronary artery bypass graft(s) without angina pectoris: Secondary | ICD-10-CM | POA: Diagnosis not present

## 2016-01-20 DIAGNOSIS — Z1211 Encounter for screening for malignant neoplasm of colon: Secondary | ICD-10-CM

## 2016-01-20 DIAGNOSIS — R030 Elevated blood-pressure reading, without diagnosis of hypertension: Secondary | ICD-10-CM | POA: Diagnosis not present

## 2016-01-20 DIAGNOSIS — Z13228 Encounter for screening for other metabolic disorders: Secondary | ICD-10-CM

## 2016-01-20 DIAGNOSIS — Z01419 Encounter for gynecological examination (general) (routine) without abnormal findings: Secondary | ICD-10-CM | POA: Diagnosis not present

## 2016-01-20 DIAGNOSIS — N6310 Unspecified lump in the right breast, unspecified quadrant: Secondary | ICD-10-CM | POA: Diagnosis not present

## 2016-01-20 DIAGNOSIS — Z0000 Encounter for general adult medical examination without abnormal findings: Secondary | ICD-10-CM

## 2016-01-20 DIAGNOSIS — Z13220 Encounter for screening for lipoid disorders: Secondary | ICD-10-CM

## 2016-01-20 DIAGNOSIS — E7800 Pure hypercholesterolemia, unspecified: Secondary | ICD-10-CM

## 2016-01-20 DIAGNOSIS — Z130 Encounter for screening for diseases of the blood and blood-forming organs and certain disorders involving the immune mechanism: Secondary | ICD-10-CM | POA: Diagnosis not present

## 2016-01-20 DIAGNOSIS — Z114 Encounter for screening for human immunodeficiency virus [HIV]: Secondary | ICD-10-CM

## 2016-01-20 DIAGNOSIS — Z951 Presence of aortocoronary bypass graft: Secondary | ICD-10-CM

## 2016-01-20 DIAGNOSIS — Z1159 Encounter for screening for other viral diseases: Secondary | ICD-10-CM | POA: Diagnosis not present

## 2016-01-20 DIAGNOSIS — L309 Dermatitis, unspecified: Secondary | ICD-10-CM

## 2016-01-20 DIAGNOSIS — L989 Disorder of the skin and subcutaneous tissue, unspecified: Secondary | ICD-10-CM

## 2016-01-20 LAB — COMPLETE METABOLIC PANEL WITH GFR
ALBUMIN: 4 g/dL (ref 3.6–5.1)
ALK PHOS: 77 U/L (ref 33–130)
ALT: 19 U/L (ref 6–29)
AST: 19 U/L (ref 10–35)
BUN: 9 mg/dL (ref 7–25)
CALCIUM: 9.6 mg/dL (ref 8.6–10.4)
CHLORIDE: 108 mmol/L (ref 98–110)
CO2: 26 mmol/L (ref 20–31)
Creat: 0.94 mg/dL (ref 0.50–1.05)
GFR, EST AFRICAN AMERICAN: 77 mL/min (ref >=60)
GFR, EST NON AFRICAN AMERICAN: 67 mL/min (ref >=60)
Glucose, Bld: 96 mg/dL (ref 65–99)
POTASSIUM: 4.2 mmol/L (ref 3.5–5.3)
Sodium: 144 mmol/L (ref 135–146)
Total Bilirubin: 0.3 mg/dL (ref 0.2–1.2)
Total Protein: 7.1 g/dL (ref 6.1–8.1)

## 2016-01-20 LAB — LIPID PANEL
CHOL/HDL RATIO: 3.3 ratio (ref <5.0)
Cholesterol: 194 mg/dL (ref <200)
HDL: 58 mg/dL (ref >50)
LDL Cholesterol: 112 mg/dL — ABNORMAL HIGH (ref <100)
TRIGLYCERIDES: 120 mg/dL (ref <150)
VLDL: 24 mg/dL (ref <30)

## 2016-01-20 LAB — CBC WITH DIFFERENTIAL/PLATELET
BASOS ABS: 0 {cells}/uL (ref 0–200)
Basophils Relative: 0 %
Eosinophils Absolute: 168 cells/uL (ref 15–500)
Eosinophils Relative: 3 %
HEMATOCRIT: 41.6 % (ref 35.0–45.0)
HEMOGLOBIN: 14 g/dL (ref 11.7–15.5)
LYMPHS ABS: 1176 {cells}/uL (ref 850–3900)
LYMPHS PCT: 21 %
MCH: 30.2 pg (ref 27.0–33.0)
MCHC: 33.7 g/dL (ref 32.0–36.0)
MCV: 89.7 fL (ref 80.0–100.0)
MONO ABS: 392 {cells}/uL (ref 200–950)
MPV: 11.6 fL (ref 7.5–12.5)
Monocytes Relative: 7 %
NEUTROS PCT: 69 %
Neutro Abs: 3864 cells/uL (ref 1500–7800)
Platelets: 242 10*3/uL (ref 140–400)
RBC: 4.64 MIL/uL (ref 3.80–5.10)
RDW: 13 % (ref 11.0–15.0)
WBC: 5.6 10*3/uL (ref 3.8–10.8)

## 2016-01-20 LAB — TSH: TSH: 1.1 mIU/L

## 2016-01-20 MED ORDER — TRIAMCINOLONE ACETONIDE 0.5 % EX CREA: 1.0000 "application " | TOPICAL_CREAM | Freq: Two times a day (BID) | CUTANEOUS | 0 refills | Status: DC

## 2016-01-20 MED ORDER — CARVEDILOL 6.25 MG PO TABS: 6.2500 mg | ORAL_TABLET | Freq: Two times a day (BID) | ORAL | 2 refills | Status: DC

## 2016-01-20 NOTE — Progress Notes (Signed)
Casey Huerta  MRN: [id number] DOB: [birth year]  Subjective:  Pt presents to clinic for a CPE.  She has not had medical care in a long time.  She was seeing Dr Stanford Breed but she just stopped - she has ben taking her Coreg regularly. Her sister recently died from cancer and the patient knows her sister would like for her to make sure her health is good so she is hear today.  Last dental exam: pt has upper and lower plates -  Last vision exam: wear glasses - yearly Last pap smear: unsure of last time - maybe about 30+ years ago Last mammogram: not recently Last colonoscopy: between 5-10 years ago Vaccinations      Tetanus - unsure Flu vaccine this year  Exercise: no Diet: no, drinks mostly water  LMP - 2 year ago - trouble with hot flashes at night worse than during the day - not sexually active that much by choice  Dry skin near fingernails of right 2/3rd digit - gets dry skin that she pulls off and then it bleeds Dark spot under her left index finger nail that had been there for >6 months - never has changed in position - does not hurt  Has not been taing a cholesterol medication for a while - ran out and just did not get more.  Patient Active Problem List   Diagnosis Date Noted  . Aortic insufficiency 03/06/2012  . Pure hypercholesterolemia 06/19/2007  . HYPERTENSION 06/19/2007  . Coronary atherosclerosis 06/19/2007    Current Outpatient Prescriptions on File Prior to Visit  Medication Sig Dispense Refill  . aspirin 81 MG tablet Take 81 mg by mouth daily.    . Multiple Vitamin (MULTIVITAMIN WITH MINERALS) TABS Take 1 tablet by mouth daily.     No current facility-administered medications on file prior to visit.     Allergies  Allergen Reactions  . Simvastatin Other (See Comments)    REACTION: constipation    Social History   Social History  . Marital status: Married    Spouse name: N/A  . Number of children: N/A  . Years of education: N/A   Social History  Main Topics  . Smoking status: Former Smoker    Quit date: 1980  . Smokeless tobacco: Never Used     Comment: QUIT SMOKING IN 1999  . Alcohol use Yes     Comment: friday nights - 2 glasses  . Drug use: No     Comment:  quit using in 1985  . Sexual activity: Yes    Partners: Male   Other Topics Concern  . None   Social History Narrative   Marital status: married   Children: 3 chidlren and 15 grandchildren   Lives with: husband   Employment: customer service Public house manager   Tobacco:  wuit   Alcohol:  Friday nights   Drugs:  none   Exercise:  no   Seatbelt: 100%   Guns in home: rifle       Secured: yes       Past Surgical History:  Procedure Laterality Date  . arthroscopic knee surgery    . CESAREAN SECTION    . CORONARY ARTERY BYPASS GRAFT    . LEFT HEART CATHETERIZATION WITH CORONARY/GRAFT ANGIOGRAM N/A 10/12/2013   Procedure: LEFT HEART CATHETERIZATION WITH Beatrix Fetters;  Surgeon: Troy Sine, MD;  Location[facility] CATH LAB;  Service: Cardiovascular;  Laterality: N/A;    Family History  Problem Relation Age of Onset  .  Heart disease Mother   . Heart disease Sister   . Cancer Sister     Review of Systems  Constitutional: Negative.   HENT: Negative.   Eyes: Negative.   Respiratory: Negative.   Cardiovascular: Negative.   Gastrointestinal: Negative.   Endocrine: Negative.   Genitourinary: Negative.   Musculoskeletal: Negative.   Skin: Negative.        Dry skin near finger tips  Allergic/Immunologic: Negative.   Neurological: Negative.   Hematological: Negative.   Psychiatric/Behavioral: Negative.     Objective:  BP (!) 152/88 (BP Location: Right Arm, Patient Position: Sitting, Cuff Size: Normal)   Pulse 74   Temp 98.1 F (36.7 C) (Oral)   Resp 17   Ht 5\' 2"  (1.575 m)   Wt 135 lb (61.2 kg)   SpO2 99%   BMI 24.69 kg/m   Physical Exam  Constitutional: She is oriented to person, place, and time and well-developed, well-nourished, and in  no distress.  HENT:  Head: Normocephalic and atraumatic.  Right Ear: Hearing, tympanic membrane, external ear and ear canal normal.  Left Ear: Hearing, tympanic membrane, external ear and ear canal normal.  Nose: Nose normal.  Mouth/Throat: Uvula is midline, oropharynx is clear and moist and mucous membranes are normal.  Eyes: Conjunctivae and EOM are normal. Pupils are equal, round, and reactive to light.  Neck: Trachea normal and normal range of motion. Neck supple. No thyroid mass and no thyromegaly present.  Cardiovascular: Normal rate, regular rhythm and normal heart sounds.   No murmur heard. Pulmonary/Chest: Effort normal and breath sounds normal. She has no wheezes. Right breast exhibits mass. Right breast exhibits no inverted nipple, no nipple discharge, no skin change and no tenderness. Left breast exhibits no inverted nipple, no mass, no nipple discharge, no skin change and no tenderness. Breasts are symmetrical.  2cm mobile nontender mass in upper inner quadrant - no skin changes  Abdominal: Soft. Bowel sounds are normal. There is no tenderness.  Genitourinary: Vagina normal, uterus normal, cervix normal, right adnexa normal, left adnexa normal and vulva normal.  Musculoskeletal: Normal range of motion.  Lymphadenopathy:    She has no cervical adenopathy.  Neurological: She is alert and oriented to person, place, and time. She has normal motor skills, normal sensation, normal strength and normal reflexes. Gait normal.  Skin: Skin is warm and dry.  Dry skin near 2/3rd digit near nail - no nail deformity Left index finger under nail on radial side <0.5cm dark irregular lesion  Psychiatric: Mood, memory, affect and judgment normal.   No exam data present  Assessment and Plan :  Annual physical exam - anticipatory guidance given  Need for Tdap vaccination - Plan: Tdap vaccine greater than or equal to 7yo IM  Screening for deficiency anemia - Plan: CBC with  Differential/Platelet  Screening cholesterol level - Plan: Lipid panel  Screening for metabolic disorder - Plan: COMPLETE METABOLIC PANEL WITH GFR  Elevated BP without diagnosis of hypertension - Plan: TSH - we will monitor  Need for hepatitis C screening test - Plan: Hepatitis C antibody  Encounter for screening for HIV - Plan: HIV antibody  Encounter for gynecological examination without abnormal finding - Plan: Pap IG and HPV (high risk) DNA detection - check labs  Atherosclerosis of coronary artery bypass graft of native heart without angina pectoris - Plan: Lipid panel, Ambulatory referral to Cardiology, carvedilol (COREG) 6.25 MG tablet - pt has not seen the cardiologist in years - we refilled her medication  but she will need to see the cardiologist to see if she needs further cardiology care - this will allow her BP to be checked and if still elevated maybe increase her Coreg dose for treatment - we are checking her cholesterol today and discussed with patient that she will be placed on medication but the dose will be determined after blood work is resulted.  Pure hypercholesterolemia - Plan: Lipid panel, Ambulatory referral to Cardiology - check labs and then start medication  Breast mass, right - Plan: MM Digital Diagnostic Bilat - pt does not do SBE so she is unsure how long this has been present -  Screen for colon cancer - Plan: Ambulatory referral to Gastroenterology, IFOBT POC (occult bld, rslt in office)  Dermatitis - Plan: triamcinolone cream (KENALOG) 0.5 %  Skin lesion - Plan: Ambulatory referral to Dermatology - check for the non movibg lesion under her fingernail  S/P CABG (coronary artery bypass graft)  Windell Hummingbird PA-C  [facility] 01/20/2016 11:18 AM

## 2016-01-20 NOTE — Patient Instructions (Addendum)
Health Maintenance, Female Introduction Adopting a healthy lifestyle and getting preventive care can go a long way to promote health and wellness. Talk with your health care provider about what schedule of regular examinations is right for you. This is a good chance for you to check in with your provider about disease prevention and staying healthy. In between checkups, there are plenty of things you can do on your own. Experts have done a lot of research about which lifestyle changes and preventive measures are most likely to keep you healthy. Ask your health care provider for more information. Weight and diet Eat a healthy diet  Be sure to include plenty of vegetables, fruits, low-fat dairy products, and lean protein.  Do not eat a lot of foods high in solid fats, added sugars, or salt.  Get regular exercise. This is one of the most important things you can do for your health.  Most adults should exercise for at least 150 minutes each week. The exercise should increase your heart rate and make you sweat (moderate-intensity exercise).  Most adults should also do strengthening exercises at least twice a week. This is in addition to the moderate-intensity exercise. Maintain a healthy weight  Body mass index (BMI) is a measurement that can be used to identify possible weight problems. It estimates body fat based on height and weight. Your health care provider can help determine your BMI and help you achieve or maintain a healthy weight.  For females 63 years of age and older:  A BMI below 18.5 is considered underweight.  A BMI of 18.5 to 24.9 is normal.  A BMI of 25 to 29.9 is considered overweight.  A BMI of 30 and above is considered obese. Watch levels of cholesterol and blood lipids  You should start having your blood tested for lipids and cholesterol at 59 years of age, then have this test every 5 years.  You may need to have your cholesterol levels checked more often if:  Your  lipid or cholesterol levels are high.  You are older than 59 years of age.  You are at high risk for heart disease. Cancer screening Lung Cancer  Lung cancer screening is recommended for adults 56-22 years old who are at high risk for lung cancer because of a history of smoking.  A yearly low-dose CT scan of the lungs is recommended for people who:  Currently smoke.  Have quit within the past 15 years.  Have at least a 30-pack-year history of smoking. A pack year is smoking an average of one pack of cigarettes a day for 1 year.  Yearly screening should continue until it has been 15 years since you quit.  Yearly screening should stop if you develop a health problem that would prevent you from having lung cancer treatment. Breast Cancer  Practice breast self-awareness. This means understanding how your breasts normally appear and feel.  It also means doing regular breast self-exams. Let your health care provider know about any changes, no matter how small.  If you are in your 20s or 30s, you should have a clinical breast exam (CBE) by a health care provider every 1-3 years as part of a regular health exam.  If you are 35 or older, have a CBE every year. Also consider having a breast X-ray (mammogram) every year.  If you have a family history of breast cancer, talk to your health care provider about genetic screening.  If you are at high risk for breast cancer,  talk to your health care provider about having an MRI and a mammogram every year.  Breast cancer gene (BRCA) assessment is recommended for women who have family members with BRCA-related cancers. BRCA-related cancers include:  Breast.  Ovarian.  Tubal.  Peritoneal cancers.  Results of the assessment will determine the need for genetic counseling and BRCA1 and BRCA2 testing. Cervical Cancer  Your health care provider may recommend that you be screened regularly for cancer of the pelvic organs (ovaries, uterus, and  vagina). This screening involves a pelvic examination, including checking for microscopic changes to the surface of your cervix (Pap test). You may be encouraged to have this screening done every 3 years, beginning at age 21.  For women ages 30-65, health care providers may recommend pelvic exams and Pap testing every 3 years, or they may recommend the Pap and pelvic exam, combined with testing for human papilloma virus (HPV), every 5 years. Some types of HPV increase your risk of cervical cancer. Testing for HPV may also be done on women of any age with unclear Pap test results.  Other health care providers may not recommend any screening for nonpregnant women who are considered low risk for pelvic cancer and who do not have symptoms. Ask your health care provider if a screening pelvic exam is right for you.  If you have had past treatment for cervical cancer or a condition that could lead to cancer, you need Pap tests and screening for cancer for at least 20 years after your treatment. If Pap tests have been discontinued, your risk factors (such as having a new sexual partner) need to be reassessed to determine if screening should resume. Some women have medical problems that increase the chance of getting cervical cancer. In these cases, your health care provider may recommend more frequent screening and Pap tests. Colorectal Cancer  This type of cancer can be detected and often prevented.  Routine colorectal cancer screening usually begins at 59 years of age and continues through 59 years of age.  Your health care provider may recommend screening at an earlier age if you have risk factors for colon cancer.  Your health care provider may also recommend using home test kits to check for hidden blood in the stool.  A small camera at the end of a tube can be used to examine your colon directly (sigmoidoscopy or colonoscopy). This is done to check for the earliest forms of colorectal  cancer.  Routine screening usually begins at age 50.  Direct examination of the colon should be repeated every 5-10 years through 59 years of age. However, you may need to be screened more often if early forms of precancerous polyps or small growths are found. Skin Cancer  Check your skin from head to toe regularly.  Tell your health care provider about any new moles or changes in moles, especially if there is a change in a mole's shape or color.  Also tell your health care provider if you have a mole that is larger than the size of a pencil eraser.  Always use sunscreen. Apply sunscreen liberally and repeatedly throughout the day.  Protect yourself by wearing long sleeves, pants, a wide-brimmed hat, and sunglasses whenever you are outside. Heart disease, diabetes, and high blood pressure  High blood pressure causes heart disease and increases the risk of stroke. High blood pressure is more likely to develop in:  People who have blood pressure in the high end of the normal range (130-139/85-89 mm Hg).    People who are overweight or obese.  People who are African American.  If you are 18-39 years of age, have your blood pressure checked every 3-5 years. If you are 40 years of age or older, have your blood pressure checked every year. You should have your blood pressure measured twice-once when you are at a hospital or clinic, and once when you are not at a hospital or clinic. Record the average of the two measurements. To check your blood pressure when you are not at a hospital or clinic, you can use:  An automated blood pressure machine at a pharmacy.  A home blood pressure monitor.  If you are between 55 years and 79 years old, ask your health care provider if you should take aspirin to prevent strokes.  Have regular diabetes screenings. This involves taking a blood sample to check your fasting blood sugar level.  If you are at a normal weight and have a low risk for diabetes,  have this test once every three years after 59 years of age.  If you are overweight and have a high risk for diabetes, consider being tested at a younger age or more often. Preventing infection Hepatitis B  If you have a higher risk for hepatitis B, you should be screened for this virus. You are considered at high risk for hepatitis B if:  You were born in a country where hepatitis B is common. Ask your health care provider which countries are considered high risk.  Your parents were born in a high-risk country, and you have not been immunized against hepatitis B (hepatitis B vaccine).  You have HIV or AIDS.  You use needles to inject street drugs.  You live with someone who has hepatitis B.  You have had sex with someone who has hepatitis B.  You get hemodialysis treatment.  You take certain medicines for conditions, including cancer, organ transplantation, and autoimmune conditions. Hepatitis C  Blood testing is recommended for:  Everyone born from 1945 through 1965.  Anyone with known risk factors for hepatitis C. Sexually transmitted infections (STIs)  You should be screened for sexually transmitted infections (STIs) including gonorrhea and chlamydia if:  You are sexually active and are younger than 59 years of age.  You are older than 59 years of age and your health care provider tells you that you are at risk for this type of infection.  Your sexual activity has changed since you were last screened and you are at an increased risk for chlamydia or gonorrhea. Ask your health care provider if you are at risk.  If you do not have HIV, but are at risk, it may be recommended that you take a prescription medicine daily to prevent HIV infection. This is called pre-exposure prophylaxis (PrEP). You are considered at risk if:  You are sexually active and do not regularly use condoms or know the HIV status of your partner(s).  You take drugs by injection.  You are sexually  active with a partner who has HIV. Talk with your health care provider about whether you are at high risk of being infected with HIV. If you choose to begin PrEP, you should first be tested for HIV. You should then be tested every 3 months for as long as you are taking PrEP. Pregnancy  If you are premenopausal and you may become pregnant, ask your health care provider about preconception counseling.  If you may become pregnant, take 400 to 800 micrograms (mcg) of folic acid   every day.  If you want to prevent pregnancy, talk to your health care provider about birth control (contraception). Osteoporosis and menopause  Osteoporosis is a disease in which the bones lose minerals and strength with aging. This can result in serious bone fractures. Your risk for osteoporosis can be identified using a bone density scan.  If you are 3 years of age or older, or if you are at risk for osteoporosis and fractures, ask your health care provider if you should be screened.  Ask your health care provider whether you should take a calcium or vitamin D supplement to lower your risk for osteoporosis.  Menopause may have certain physical symptoms and risks.  Hormone replacement therapy may reduce some of these symptoms and risks. Talk to your health care provider about whether hormone replacement therapy is right for you. Follow these instructions at home:  Schedule regular health, dental, and eye exams.  Stay current with your immunizations.  Do not use any tobacco products including cigarettes, chewing tobacco, or electronic cigarettes.  If you are pregnant, do not drink alcohol.  If you are breastfeeding, limit how much and how often you drink alcohol.  Limit alcohol intake to no more than 1 drink per day for nonpregnant women. One drink equals 12 ounces of beer, 5 ounces of wine, or 1 ounces of hard liquor.  Do not use street drugs.  Do not share needles.  Ask your health care provider for  help if you need support or information about quitting drugs.  Tell your health care provider if you often feel depressed.  Tell your health care provider if you have ever been abused or do not feel safe at home. This information is not intended to replace advice given to you by your health care provider. Make sure you discuss any questions you have with your health care provider. Document Released: 08/20/2010 Document Revised: 07/13/2015 Document Reviewed: 11/08/2014  2017 Elsevier     IF you received an x-ray today, you will receive an invoice from [facility]. Please contact [facility] Radiology at [phone] with questions or concerns regarding your invoice.   IF you received labwork today, you will receive an invoice from [employer]. Please contact [facility] at [phone] with questions or concerns regarding your invoice.   Our billing staff will not be able to assist you with questions regarding bills from these companies.  You will be contacted with the lab results as soon as they are available. The fastest way to get your results is to activate your My Chart account. Instructions are located on the last page of this paperwork. If you have not heard from [place] regarding the results in 2 weeks, please contact this office.    We recommend that you schedule a mammogram for breast cancer screening. Typically, you do not need a referral to do this. Please contact a local imaging center to schedule your mammogram.  [facility] - [phone]  *ask for the Radiology Department [facility] ([place]) - [phone] or [phone]  [facility] - [phone] [place] [phone] [facility] - [phone]  *ask for the [facility] - [phone]  *ask for the Radiology Department [facility] - [phone]  *ask for the [place] - [phone]

## 2016-01-21 LAB — HEPATITIS C ANTIBODY: HCV AB: NEGATIVE

## 2016-01-21 LAB — HIV ANTIBODY (ROUTINE TESTING W REFLEX): HIV 1&2 Ab, 4th Generation: NONREACTIVE

## 2016-01-24 LAB — PAP IG AND HPV HIGH-RISK: HPV DNA High Risk: DETECTED — AB

## 2016-01-25 ENCOUNTER — Other Ambulatory Visit: Payer: Self-pay | Admitting: Physician Assistant

## 2016-01-25 DIAGNOSIS — N6310 Unspecified lump in the right breast, unspecified quadrant: Secondary | ICD-10-CM

## 2016-02-23 ENCOUNTER — Encounter: Payer: Self-pay | Admitting: Physician Assistant

## 2016-02-26 NOTE — Progress Notes (Deleted)
HPI: FU CAD. She is status post coronary artery bypass and graft in 2001. Abdominal ultrasound in February 2008 showed no aneurysm. Echocardiogram in March of 2010 showed normal LV function, mild to moderate aortic insufficiency and mild mitral regurgitation. Cardiac catheterization August 2015 showed a 20% LAD and an atretic LIMA to LAD. No other disease noted. LV function normal. Since last seen,    Current Outpatient Prescriptions  Medication Sig Dispense Refill  . aspirin 81 MG tablet Take 81 mg by mouth daily.    . carvedilol (COREG) 6.25 MG tablet Take 1 tablet (6.25 mg total) by mouth 2 (two) times daily with a meal. 60 tablet 2  . Multiple Vitamin (MULTIVITAMIN WITH MINERALS) TABS Take 1 tablet by mouth daily.    Marland Kitchen triamcinolone cream (KENALOG) 0.5 % Apply 1 application topically 2 (two) times daily. To fingers as needed for dry skin 45 g 0   No current facility-administered medications for this visit.      Past Medical History:  Diagnosis Date  . Aortic insufficiency    a. Mild-mod AI by echo 2010.  Marland Kitchen COPD   . CORONARY ARTERY DISEASE    a. CABG (LIMA-->LAD 2001). b. Myoview 2008 no ischemia, EF 63%.  c. cath 10/14/2013 mild 20% ost LAD, atretic LIMA, EF 55%, medical therapy  . HYPERLIPIDEMIA   . HYPERTENSION   . MENORRHAGIA     Past Surgical History:  Procedure Laterality Date  . arthroscopic knee surgery    . CESAREAN SECTION    . CORONARY ARTERY BYPASS GRAFT    . LEFT HEART CATHETERIZATION WITH CORONARY/GRAFT ANGIOGRAM N/A 10/12/2013   Procedure: LEFT HEART CATHETERIZATION WITH Beatrix Fetters;  Surgeon: Troy Sine, MD;  Location: [facility] CATH LAB;  Service: Cardiovascular;  Laterality: N/A;    Social History   Social History  . Marital status: Married    Spouse name: N/A  . Number of children: N/A  . Years of education: N/A   Occupational History  . Not on file.   Social History Main Topics  . Smoking status: Former Smoker    Quit date: 1980    . Smokeless tobacco: Never Used     Comment: QUIT SMOKING IN 1999  . Alcohol use Yes     Comment: friday nights - 2 glasses  . Drug use: No     Comment:  quit using in 1985  . Sexual activity: Yes    Partners: Male   Other Topics Concern  . Not on file   Social History Narrative   Marital status: married   Children: 3 chidlren and 15 grandchildren   Lives with: husband   Employment: customer service [name]   Tobacco:  wuit   Alcohol:  Friday nights   Drugs:  none   Exercise:  no   Seatbelt: 100%   Guns in home: rifle       Secured: yes       Family History  Problem Relation Age of Onset  . Heart disease Mother   . Heart disease Sister   . Cancer Sister     ROS: no fevers or chills, productive cough, hemoptysis, dysphasia, odynophagia, melena, hematochezia, dysuria, hematuria, rash, seizure activity, orthopnea, PND, pedal edema, claudication. Remaining systems are negative.  Physical Exam: Well-developed well-nourished in no acute distress.  Skin is warm and dry.  HEENT is normal.  Neck is supple.  Chest is clear to auscultation with normal expansion.  Cardiovascular exam is regular  rate and rhythm.  Abdominal exam nontender or distended. No masses palpated. Extremities show no edema. neuro grossly intact  ECG

## 2016-02-29 ENCOUNTER — Ambulatory Visit: Payer: Managed Care, Other (non HMO) | Admitting: Cardiology

## 2016-05-17 ENCOUNTER — Ambulatory Visit
Admission: RE | Admit: 2016-05-17 | Discharge: 2016-05-17 | Disposition: A | Discharge status: Home / Self Care | Payer: Managed Care, Other (non HMO) | Source: Ambulatory Visit | Attending: Physician Assistant | Admitting: Physician Assistant

## 2016-05-17 ENCOUNTER — Other Ambulatory Visit: Payer: Self-pay | Admitting: Physician Assistant

## 2016-05-17 DIAGNOSIS — N63 Unspecified lump in unspecified breast: Secondary | ICD-10-CM | POA: Insufficient documentation

## 2016-05-17 DIAGNOSIS — N6310 Unspecified lump in the right breast, unspecified quadrant: Secondary | ICD-10-CM

## 2016-05-17 HISTORY — DX: Unspecified lump in unspecified breast: N63.0

## 2016-05-21 NOTE — Progress Notes (Signed)
HPI: FU coronary artery disease. She is status post coronary artery bypass and graft in 2001. Echocardiogram in March of 2010 showed normal LV function, mild to moderate aortic insufficiency and mild mitral regurgitation. Last cardiac catheterization August 2015 showed a 20% LAD, normal circumflex, normal right coronary artery and atretic LIMA to the LAD. LV function normal. Abdominal CT September 2016 showed no aneurysm. Since last seen, the patient denies any dyspnea on exertion, orthopnea, PND, pedal edema, palpitations, syncope or chest pain.   Current Outpatient Prescriptions  Medication Sig Dispense Refill  . aspirin 81 MG tablet Take 81 mg by mouth daily.    . carvedilol (COREG) 6.25 MG tablet Take 1 tablet (6.25 mg total) by mouth 2 (two) times daily with a meal. 60 tablet 2  . Multiple Vitamin (MULTIVITAMIN WITH MINERALS) TABS Take 1 tablet by mouth daily.    Marland Kitchen triamcinolone cream (KENALOG) 0.5 % Apply 1 application topically 2 (two) times daily. To fingers as needed for dry skin 45 g 0   No current facility-administered medications for this visit.      Past Medical History:  Diagnosis Date  . Aortic insufficiency    a. Mild-mod AI by echo 2010.  . Breast mass 05/17/2016   dr feels lump inner right quad pt cannot feel lump  . COPD   . CORONARY ARTERY DISEASE    a. CABG (LIMA-->LAD 2001). b. Myoview 2008 no ischemia, EF 63%.  c. cath 10/14/2013 mild 20% ost LAD, atretic LIMA, EF 55%, medical therapy  . HYPERLIPIDEMIA   . HYPERTENSION   . MENORRHAGIA     Past Surgical History:  Procedure Laterality Date  . arthroscopic knee surgery    . CESAREAN SECTION    . CORONARY ARTERY BYPASS GRAFT    . LEFT HEART CATHETERIZATION WITH CORONARY/GRAFT ANGIOGRAM N/A 10/12/2013   Procedure: LEFT HEART CATHETERIZATION WITH Beatrix Fetters;  Surgeon: Troy Sine, MD;  Location[facility] C[facility] LAB;  Service: Cardiovascular;  Laterality: N/A;    Social History   Social History    . Marital status: Married    Spouse name: N/A  . Number of children: N/A  . Years of education: N/A   Occupational History  . Not on file.   Social History Main Topics  . Smoking status: Former Smoker    Quit date: 1980  . Smokeless tobacco: Never Used     Comment: QUIT SMOKING IN 1999  . Alcohol use Yes     Comment: friday nights - 2 glasses  . Drug use: No     Comment:  quit using in 1985  . Sexual activity: Yes    Partners: Male   Other Topics Concern  . Not on file   Social History Narrative   Marital status: married   Children: 3 chidlren and 15 grandchildren   Lives with: husband   Employment: customer service [name]   Tobacco:  wuit   Alcohol:  Friday nights   Drugs:  none   Exercise:  no   Seatbelt: 100%   Guns in home: rifle       Secured: yes       Family History  Problem Relation Age of Onset  . Heart disease Mother   . Heart disease Sister   . Cancer Sister     ROS: no fevers or chills, productive cough, hemoptysis, dysphasia, odynophagia, melena, hematochezia, dysuria, hematuria, rash, seizure activity, orthopnea, PND, pedal edema, claudication. Remaining systems are negative.  Physical Exam: Well-developed well-nourished in no acute distress.  Skin is warm and dry.  HEENT is normal.  Neck is supple. No bruits Chest is clear to auscultation with normal expansion.  Cardiovascular exam is regular rate and rhythm.  Abdominal exam nontender or distended. No masses palpated. Extremities show no edema. neuro grossly intact  ECG- Normal sinus rhythm at a rate of 65. Nonspecific ST changes. personally reviewed  A/P  1 Coronary artery disease-continue aspirin and resume statin.  2 hypertension-blood pressure controlled and she has not been taking coreg; will DC coreg and follow BP.  3 hyperlipidemia-LDL 12/17 112. LFTs normal. Resume statin; begin crestor 20 mg daily; check lipids and liver 4 weeks.  4 aortic insufficiency-plan  follow-up echocardiogram.   Kirk Ruths, MD

## 2016-05-28 ENCOUNTER — Ambulatory Visit
Admission: RE | Admit: 2016-05-28 | Discharge: 2016-05-28 | Disposition: A | Discharge status: Home / Self Care | Payer: Managed Care, Other (non HMO) | Source: Ambulatory Visit | Attending: Physician Assistant | Admitting: Physician Assistant

## 2016-05-28 ENCOUNTER — Ambulatory Visit (INDEPENDENT_AMBULATORY_CARE_PROVIDER_SITE_OTHER): Payer: Managed Care, Other (non HMO) | Admitting: Cardiology

## 2016-05-28 ENCOUNTER — Other Ambulatory Visit: Payer: Self-pay | Admitting: Physician Assistant

## 2016-05-28 ENCOUNTER — Encounter: Payer: Self-pay | Admitting: Cardiology

## 2016-05-28 VITALS — BP 118/60 | HR 65 | Ht 62.0 in | Wt 136.0 lb

## 2016-05-28 DIAGNOSIS — I351 Nonrheumatic aortic (valve) insufficiency: Secondary | ICD-10-CM

## 2016-05-28 DIAGNOSIS — R072 Precordial pain: Secondary | ICD-10-CM

## 2016-05-28 DIAGNOSIS — N6310 Unspecified lump in the right breast, unspecified quadrant: Secondary | ICD-10-CM

## 2016-05-28 DIAGNOSIS — I2510 Atherosclerotic heart disease of native coronary artery without angina pectoris: Secondary | ICD-10-CM

## 2016-05-28 DIAGNOSIS — I10 Essential (primary) hypertension: Secondary | ICD-10-CM | POA: Diagnosis not present

## 2016-05-28 DIAGNOSIS — E7800 Pure hypercholesterolemia, unspecified: Secondary | ICD-10-CM | POA: Diagnosis not present

## 2016-05-28 HISTORY — PX: BREAST BIOPSY: SHX20

## 2016-05-28 MED ORDER — ROSUVASTATIN CALCIUM 20 MG PO TABS: 20.0000 mg | ORAL_TABLET | Freq: Every day | ORAL | 3 refills | Status: DC

## 2016-05-28 NOTE — Patient Instructions (Signed)
Medication Instructions:   STOP CARVEDILOL  START ROSUVASTATIN 20 MG ONCE DAILY  Labwork:  Your physician recommends that you return for lab work in: 4 WEEKS= DO NOT EAT PRIOR TO LAB WORK  Testing/Procedures:  Your physician has requested that you have an echocardiogram. Echocardiography is a painless test that uses sound waves to create images of your heart. It provides your doctor with information about the size and shape of your heart and how well your heart's chambers and valves are working. This procedure takes approximately one hour. There are no restrictions for this procedure.    Follow-Up:  Your physician wants you to follow-up in: [place] will receive a reminder letter in the mail two months in advance. If you don't receive a letter, please call our office to schedule the follow-up appointment.   If you need a refill on your cardiac medications before your next appointment, please call your pharmacy.

## 2016-06-11 ENCOUNTER — Other Ambulatory Visit (HOSPITAL_COMMUNITY): Payer: Managed Care, Other (non HMO)

## 2016-06-14 ENCOUNTER — Ambulatory Visit (INDEPENDENT_AMBULATORY_CARE_PROVIDER_SITE_OTHER): Payer: Managed Care, Other (non HMO) | Admitting: Physician Assistant

## 2016-06-14 ENCOUNTER — Ambulatory Visit (INDEPENDENT_AMBULATORY_CARE_PROVIDER_SITE_OTHER): Payer: Managed Care, Other (non HMO)

## 2016-06-14 ENCOUNTER — Encounter: Payer: Self-pay | Admitting: Physician Assistant

## 2016-06-14 VITALS — BP 162/73 | HR 72 | Temp 97.9°F | Resp 18 | Ht 61.81 in | Wt 140.8 lb

## 2016-06-14 DIAGNOSIS — M170 Bilateral primary osteoarthritis of knee: Secondary | ICD-10-CM | POA: Diagnosis not present

## 2016-06-14 DIAGNOSIS — M25562 Pain in left knee: Secondary | ICD-10-CM | POA: Diagnosis not present

## 2016-06-14 DIAGNOSIS — R030 Elevated blood-pressure reading, without diagnosis of hypertension: Secondary | ICD-10-CM | POA: Diagnosis not present

## 2016-06-14 MED ORDER — CELECOXIB 200 MG PO CAPS: 200.0000 mg | ORAL_CAPSULE | Freq: Every day | ORAL | 1 refills | Status: DC

## 2016-06-14 MED ORDER — DICLOFENAC SODIUM 1 % TD GEL: 2.0000 g | Freq: Four times a day (QID) | TRANSDERMAL | 1 refills | Status: DC

## 2016-06-14 NOTE — Progress Notes (Signed)
Casey Huerta  MRN: [id number] DOB: 1955-02-23  PCP: Elizabeth Sauer  Chief Complaint  Patient presents with  . Knee Pain    X 3 days- left knee     Subjective:  Pt presents to clinic for left knee pain that she has had for the last 3 days - she gets this on and off but it has been a while since it happened.  It seems swollen to her.  The pain is the entire knee - the leg below and above the knee has no pain.  She has been using motrin and xanax (from her husband) and pain cream voltaren that seemed to help.    Hx of arthroscopic surgery bilateral knees (15 years ago).  She was told her knees would likely need to be replaced at some point.  Review of Systems  Musculoskeletal: Positive for gait problem (2nd to pain).    Patient Active Problem List   Diagnosis Date Noted  . Osteoarthritis of both knees 06/14/2016  . Elevated BP without diagnosis of hypertension 06/14/2016  . Aortic insufficiency 03/06/2012  . Pure hypercholesterolemia 06/19/2007  . HYPERTENSION 06/19/2007  . Coronary atherosclerosis 06/19/2007    Current Outpatient Prescriptions on File Prior to Visit  Medication Sig Dispense Refill  . aspirin 81 MG tablet Take 81 mg by mouth daily.    . Multiple Vitamin (MULTIVITAMIN WITH MINERALS) TABS Take 1 tablet by mouth daily.    . rosuvastatin (CRESTOR) 20 MG tablet Take 1 tablet (20 mg total) by mouth daily. 90 tablet 3  . triamcinolone cream (KENALOG) 0.5 % Apply 1 application topically 2 (two) times daily. To fingers as needed for dry skin 45 g 0   No current facility-administered medications on file prior to visit.     Allergies  Allergen Reactions  . Simvastatin Other (See Comments)    REACTION: constipation    Pt patients past, family and social history were reviewed and updated.   Objective:  BP (!) 162/73   Pulse 72   Temp 97.9 F (36.6 C) (Oral)   Resp 18   Ht 5' 1.81" (1.57 m)   Wt 140 lb 12.8 oz (63.9 kg)   SpO2 97%   BMI 25.91  kg/m   Physical Exam  Constitutional: She is oriented to person, place, and time and well-developed, well-nourished, and in no distress.  HENT:  Head: Normocephalic and atraumatic.  Right Ear: Hearing and external ear normal.  Left Ear: Hearing and external ear normal.  Eyes: Conjunctivae are normal.  Neck: Normal range of motion.  Pulmonary/Chest: Effort normal.  Musculoskeletal:       Right knee: Normal.       Left knee: She exhibits decreased range of motion (2nd to pain). She exhibits no swelling, no effusion, no LCL laxity, normal patellar mobility, normal meniscus and no MCL laxity. Tenderness found. Medial joint line and lateral joint line tenderness noted. No MCL, no LCL and no patellar tendon tenderness noted.       Left lower leg: Normal.  Neurological: She is alert and oriented to person, place, and time. Gait normal.  Skin: Skin is warm and dry.  Psychiatric: Mood, memory, affect and judgment normal.  Vitals reviewed.  Dg Knee Complete 4 Views Left  Result Date: 06/14/2016 CLINICAL DATA:  Acute pain of the left knee EXAM: LEFT KNEE - COMPLETE 4+ VIEW COMPARISON:  None. FINDINGS: No fracture or malalignment. Trace suprapatellar effusion. Moderate degenerative changes of the patellofemoral joint with bony spurring  present. Moderate to severe narrowing of the medial compartments with bony spurring. Marked narrowing of the lateral compartment with sclerosis and osteophyte. Moderate severe degenerative changes of the right knee on limited single view. IMPRESSION: Moderate severe arthritis of the left knee with trace suprapatellar effusion. No acute osseous abnormality Electronically Signed   By: Donavan Foil M.D.   On: 06/14/2016 14:24    Assessment and Plan :  Acute pain of left knee - Plan: DG Knee Complete 4 Views Left, Measure blood pressure, diclofenac sodium (VOLTAREN) 1 % GEL, celecoxib (CELEBREX) 200 MG capsule  Osteoarthritis of both knees, unspecified osteoarthritis  type  Elevated BP without diagnosis of hypertension - pt is in pain and at cardiologist visit in 2/18 she was taken off the coreg because her BP was fine not on the medications.  She will recheck with me to make sure BP is controlled once her pain is controlled.  Rest her knee - use gel daily and then oral as needed - at her return visit she will be reassessed - she will possibly gain benefit from injection but I suspect her injection to be difficult due to her arthritis  Windell Hummingbird PA-C  [facility] 06/14/2016 2:49 PM

## 2016-06-14 NOTE — Patient Instructions (Addendum)
  Cream to knee - medication by mouth if needed  See me in 3 weeks   IF you received an x-ray today, you will receive an invoice from [facility]. Please contact [facility] at [phone] with questions or concerns regarding your invoice.   IF you received labwork today, you will receive an invoice from [facility]. Please contact [facility] at [phone] with questions or concerns regarding your invoice.   Our billing staff will not be able to assist you with questions regarding bills from these companies.  You will be contacted with the lab results as soon as they are available. The fastest way to get your results is to activate your My Chart account. Instructions are located on the last page of this paperwork. If you have not heard from [place] regarding the results in 2 weeks, please contact this office.

## 2016-07-23 ENCOUNTER — Encounter: Payer: Self-pay | Admitting: *Deleted

## 2016-07-25 ENCOUNTER — Telehealth (HOSPITAL_COMMUNITY): Payer: Self-pay | Admitting: Cardiology

## 2016-07-30 ENCOUNTER — Encounter: Payer: Self-pay | Admitting: *Deleted

## 2016-07-30 LAB — HEPATIC FUNCTION PANEL
ALBUMIN: 4.2 g/dL (ref 3.6–5.1)
ALK PHOS: 79 U/L (ref 33–130)
ALT: 19 U/L (ref 6–29)
AST: 19 U/L (ref 10–35)
BILIRUBIN INDIRECT: 0.4 mg/dL (ref 0.2–1.2)
Bilirubin, Direct: 0.1 mg/dL (ref <=0.2)
TOTAL PROTEIN: 7.4 g/dL (ref 6.1–8.1)
Total Bilirubin: 0.5 mg/dL (ref 0.2–1.2)

## 2016-07-30 LAB — LIPID PANEL
CHOLESTEROL: 153 mg/dL (ref <200)
HDL: 61 mg/dL (ref >50)
LDL Cholesterol: 66 mg/dL (ref <100)
TRIGLYCERIDES: 129 mg/dL (ref <150)
Total CHOL/HDL Ratio: 2.5 Ratio (ref <5.0)
VLDL: 26 mg/dL (ref <30)

## 2016-07-30 NOTE — Telephone Encounter (Signed)
  07/25/2016 01:00 PM Phone (Outgoing) Casey Huerta, Casey Huerta (Self) [phone] (H)   Left Message - Pt returned my call and asked me to call back and I did and was not able to get her so I lmsg for her to CB    By Verdene Rio

## 2016-08-01 ENCOUNTER — Telehealth (HOSPITAL_COMMUNITY): Payer: Self-pay | Admitting: Cardiology

## 2016-08-01 NOTE — Telephone Encounter (Signed)
User: Cherie Dark A Date/time: 08/01/2016 2:22 PM  Comment: Called pt and lmsg for her to CB to r/s echo.   Context: [place] Schedule Orders/Appt Requests Outcome: Left Message  Phone number: [phone] Phone Type: Home Phone  Comm. type: Telephone Call type: Outgoing  Contact: [name] Relation to patient: Self  Letter:      User: Cherie Dark A Date/time: 07/25/2016 10:12 AM  Comment: Called pt and lmsg for her to CB to r/s echo that was cancelled on 06/11/16  Context: [place] Schedule Orders/Appt Requests Outcome: Left Message  Phone number: [phone] Phone Type: Home Phone  Comm. type: Telephone Call type: Outgoing  Contact: [name] Relation to patient: Self  Letter:

## 2016-08-23 ENCOUNTER — Telehealth (HOSPITAL_COMMUNITY): Payer: Self-pay | Admitting: Cardiology

## 2016-08-23 NOTE — Telephone Encounter (Signed)
User: Cherie Dark A Date/time: 08/01/2016 2:22 PM  Comment: Called pt and lmsg for her to CB to r/s echo.   Context: [place] Schedule Orders/Appt Requests Outcome: Left Message  Phone number: [phone] Phone Type: Home Phone  Comm. type: Telephone Call type: Outgoing  Contact: [name] Relation to patient: Self  Letter:      User: Cherie Dark A Date/time: 07/25/2016 10:12 AM  Comment: Called pt and lmsg for her to CB to r/s echo that was cancelled on 06/11/16  Context: [place] Schedule Orders/Appt Requests Outcome: Left Message  Phone number: [phone] Phone Type: Home Phone  Comm. type: Telephone Call type: Outgoing  Contact: Don, Giarr[place] Relation to patient: Self  Letter:             08-02-16 Called and LVM for patient to call back and schedule echo. ST  Patient (1/43 per pt conflict with schedule. will call back to reschedule. bs

## 2016-11-05 ENCOUNTER — Other Ambulatory Visit: Payer: Self-pay | Admitting: Physician Assistant

## 2016-11-05 DIAGNOSIS — M25562 Pain in left knee: Secondary | ICD-10-CM

## 2016-11-05 NOTE — Telephone Encounter (Signed)
Please advise was seen in april

## 2016-11-18 ENCOUNTER — Other Ambulatory Visit: Payer: Self-pay | Admitting: Physician Assistant

## 2016-11-18 DIAGNOSIS — M25562 Pain in left knee: Secondary | ICD-10-CM

## 2017-01-07 ENCOUNTER — Other Ambulatory Visit: Payer: Self-pay | Admitting: Physician Assistant

## 2017-01-07 DIAGNOSIS — M25562 Pain in left knee: Secondary | ICD-10-CM

## 2017-01-07 NOTE — Telephone Encounter (Signed)
Does pt need bmet before refill can be done

## 2017-01-07 NOTE — Telephone Encounter (Signed)
Please advise 

## 2017-01-14 NOTE — Telephone Encounter (Signed)
Meds ordered this encounter  Medications  . celecoxib (CELEBREX) 200 MG capsule    Sig: TAKE ONE CAPSULE BY MOUTH DAILY    Dispense:  30 capsule    Refill:  0

## 2017-01-15 ENCOUNTER — Telehealth: Payer: Self-pay | Admitting: Cardiology

## 2017-01-15 ENCOUNTER — Other Ambulatory Visit: Payer: Self-pay | Admitting: Physician Assistant

## 2017-01-15 DIAGNOSIS — M25562 Pain in left knee: Secondary | ICD-10-CM

## 2017-01-15 NOTE — Telephone Encounter (Signed)
Pt c/o of Chest Pain: STAT if CP now or developed within 24 hours  1. Are you having CP right now? No   2. Are you experiencing any other symptoms (ex. SOB, nausea, vomiting, sweating)? No   3. How long have you been experiencing CP? 1wk   4. Is your CP continuous or coming and going? yes  5. Have you taken Nitroglycerin? no ? Both arms hurt off an on chest pain

## 2017-01-15 NOTE — Telephone Encounter (Signed)
Pt returned call please give her a call at work.

## 2017-01-15 NOTE — Telephone Encounter (Signed)
Returned the call to the patient. She stated that for a week she has had on and off right arm pain and occasional chest pain. She stated that the pain does not last long but they are getting stronger. She stated that the pain reminds her of when she previously had blockages and had open heart surgery. An appointment has been made with Kerin Ransom, Pa for tomorrow.  She has been advised to go to the ED for further assessment if the pain returns. She has been taking Ibuprofen for the pain.  She verbalized her understanding.

## 2017-01-15 NOTE — Telephone Encounter (Signed)
Left a message to call back.

## 2017-01-16 ENCOUNTER — Observation Stay (HOSPITAL_COMMUNITY)
Admission: EM | Admit: 2017-01-16 | Discharge: 2017-01-18 | Disposition: A | Discharge status: Home / Self Care | Payer: Managed Care, Other (non HMO) | Attending: Emergency Medicine | Admitting: Emergency Medicine

## 2017-01-16 ENCOUNTER — Emergency Department (HOSPITAL_COMMUNITY): Payer: Managed Care, Other (non HMO)

## 2017-01-16 ENCOUNTER — Other Ambulatory Visit: Payer: Self-pay

## 2017-01-16 ENCOUNTER — Encounter: Payer: Self-pay | Admitting: Cardiology

## 2017-01-16 ENCOUNTER — Encounter (HOSPITAL_COMMUNITY): Payer: Self-pay

## 2017-01-16 ENCOUNTER — Ambulatory Visit (INDEPENDENT_AMBULATORY_CARE_PROVIDER_SITE_OTHER): Payer: Self-pay | Admitting: Cardiology

## 2017-01-16 DIAGNOSIS — I2510 Atherosclerotic heart disease of native coronary artery without angina pectoris: Secondary | ICD-10-CM | POA: Diagnosis not present

## 2017-01-16 DIAGNOSIS — R079 Chest pain, unspecified: Secondary | ICD-10-CM | POA: Diagnosis present

## 2017-01-16 DIAGNOSIS — I10 Essential (primary) hypertension: Secondary | ICD-10-CM | POA: Diagnosis not present

## 2017-01-16 DIAGNOSIS — Z7982 Long term (current) use of aspirin: Secondary | ICD-10-CM | POA: Insufficient documentation

## 2017-01-16 DIAGNOSIS — E785 Hyperlipidemia, unspecified: Secondary | ICD-10-CM | POA: Diagnosis present

## 2017-01-16 DIAGNOSIS — Z79899 Other long term (current) drug therapy: Secondary | ICD-10-CM | POA: Diagnosis not present

## 2017-01-16 DIAGNOSIS — R0789 Other chest pain: Principal | ICD-10-CM | POA: Insufficient documentation

## 2017-01-16 DIAGNOSIS — Z951 Presence of aortocoronary bypass graft: Secondary | ICD-10-CM | POA: Diagnosis not present

## 2017-01-16 DIAGNOSIS — J449 Chronic obstructive pulmonary disease, unspecified: Secondary | ICD-10-CM | POA: Insufficient documentation

## 2017-01-16 DIAGNOSIS — Z87891 Personal history of nicotine dependence: Secondary | ICD-10-CM | POA: Diagnosis not present

## 2017-01-16 DIAGNOSIS — I351 Nonrheumatic aortic (valve) insufficiency: Secondary | ICD-10-CM | POA: Diagnosis present

## 2017-01-16 LAB — CBC WITH DIFFERENTIAL/PLATELET
Basophils Absolute: 0 10*3/uL (ref 0.0–0.1)
Basophils Relative: 0 %
Eosinophils Absolute: 0.2 10*3/uL (ref 0.0–0.7)
Eosinophils Relative: 3 %
HCT: 42.2 % (ref 36.0–46.0)
Hemoglobin: 13.8 g/dL (ref 12.0–15.0)
Lymphocytes Relative: 43 %
Lymphs Abs: 2.2 10*3/uL (ref 0.7–4.0)
MCH: 29.7 pg (ref 26.0–34.0)
MCHC: 32.7 g/dL (ref 30.0–36.0)
MCV: 90.8 fL (ref 78.0–100.0)
Monocytes Absolute: 0.3 10*3/uL (ref 0.1–1.0)
Monocytes Relative: 6 %
Neutro Abs: 2.4 10*3/uL (ref 1.7–7.7)
Neutrophils Relative %: 48 %
Platelets: 198 10*3/uL (ref 150–400)
RBC: 4.65 MIL/uL (ref 3.87–5.11)
RDW: 12.3 % (ref 11.5–15.5)
WBC: 5.1 10*3/uL (ref 4.0–10.5)

## 2017-01-16 LAB — I-STAT TROPONIN, ED: Troponin i, poc: 0 ng/mL (ref 0.00–0.08)

## 2017-01-16 LAB — TROPONIN I
Troponin I: <0.03 ng/mL (ref <0.03)
Troponin I: <0.03 ng/mL (ref <0.03)

## 2017-01-16 LAB — I-STAT BETA HCG BLOOD, ED (MC, WL, AP ONLY)

## 2017-01-16 LAB — PROTIME-INR
INR: 1.01
Prothrombin Time: 13.2 seconds (ref 11.4–15.2)

## 2017-01-16 LAB — MRSA PCR SCREENING: MRSA by PCR: NEGATIVE

## 2017-01-16 LAB — BASIC METABOLIC PANEL
ANION GAP: 8 (ref 5–15)
BUN: 10 mg/dL (ref 6–20)
CALCIUM: 9.6 mg/dL (ref 8.9–10.3)
CO2: 25 mmol/L (ref 22–32)
Chloride: 108 mmol/L (ref 101–111)
Creatinine, Ser: 1.01 mg/dL — ABNORMAL HIGH (ref 0.44–1.00)
GFR, EST NON AFRICAN AMERICAN: 59 mL/min — AB (ref >60)
Glucose, Bld: 110 mg/dL — ABNORMAL HIGH (ref 65–99)
Potassium: 4 mmol/L (ref 3.5–5.1)
Sodium: 141 mmol/L (ref 135–145)

## 2017-01-16 MED ORDER — ALPRAZOLAM 0.25 MG PO TABS: 0.2500 mg | ORAL_TABLET | Freq: Two times a day (BID) | ORAL | Status: DC | PRN

## 2017-01-16 MED ORDER — KETOROLAC TROMETHAMINE 30 MG/ML IJ SOLN
30.0000 mg | Freq: Once | INTRAMUSCULAR | Status: AC
  Administered 2017-01-16: 30 mg via INTRAVENOUS
  Filled 2017-01-16: qty 1

## 2017-01-16 MED ORDER — OXYCODONE-ACETAMINOPHEN 5-325 MG PO TABS
1.0000 | ORAL_TABLET | Freq: Four times a day (QID) | ORAL | Status: DC | PRN
  Administered 2017-01-16 – 2017-01-17 (×2): 1 via ORAL
  Filled 2017-01-16 (×2): qty 1

## 2017-01-16 MED ORDER — ONDANSETRON HCL 4 MG/2ML IJ SOLN: 4.0000 mg | Freq: Four times a day (QID) | INTRAMUSCULAR | Status: DC | PRN

## 2017-01-16 MED ORDER — METOPROLOL TARTRATE 12.5 MG HALF TABLET
12.5000 mg | ORAL_TABLET | Freq: Two times a day (BID) | ORAL | Status: DC
  Administered 2017-01-16 – 2017-01-18 (×5): 12.5 mg via ORAL
  Filled 2017-01-16 (×5): qty 1

## 2017-01-16 MED ORDER — ZOLPIDEM TARTRATE 5 MG PO TABS: 5.0000 mg | ORAL_TABLET | Freq: Every evening | ORAL | Status: DC | PRN

## 2017-01-16 MED ORDER — ENOXAPARIN SODIUM 40 MG/0.4ML ~~LOC~~ SOLN
40.0000 mg | SUBCUTANEOUS | Status: DC
  Administered 2017-01-16: 40 mg via SUBCUTANEOUS
  Filled 2017-01-16: qty 0.4

## 2017-01-16 MED ORDER — ASPIRIN EC 81 MG PO TBEC
81.0000 mg | DELAYED_RELEASE_TABLET | Freq: Every day | ORAL | Status: DC
  Administered 2017-01-17: 81 mg via ORAL
  Filled 2017-01-16: qty 1

## 2017-01-16 MED ORDER — ACETAMINOPHEN 325 MG PO TABS
650.0000 mg | ORAL_TABLET | ORAL | Status: DC | PRN
  Administered 2017-01-16: 650 mg via ORAL
  Filled 2017-01-16: qty 2

## 2017-01-16 MED ORDER — SODIUM CHLORIDE 0.9 % WEIGHT BASED INFUSION
1.0000 mL/kg/h | INTRAVENOUS | Status: DC
  Administered 2017-01-17: 1 mL/kg/h via INTRAVENOUS

## 2017-01-16 MED ORDER — SODIUM CHLORIDE 0.9 % IV SOLN: 250.0000 mL | INTRAVENOUS | Status: DC | PRN

## 2017-01-16 MED ORDER — ROSUVASTATIN CALCIUM 10 MG PO TABS
20.0000 mg | ORAL_TABLET | Freq: Every day | ORAL | Status: DC
  Filled 2017-01-16: qty 2

## 2017-01-16 MED ORDER — NITROGLYCERIN 0.4 MG SL SUBL
0.4000 mg | SUBLINGUAL_TABLET | SUBLINGUAL | Status: DC | PRN
  Administered 2017-01-16 (×2): 0.4 mg via SUBLINGUAL
  Filled 2017-01-16: qty 1

## 2017-01-16 MED ORDER — AMLODIPINE BESYLATE 5 MG PO TABS
5.0000 mg | ORAL_TABLET | Freq: Every day | ORAL | Status: DC
  Administered 2017-01-16 – 2017-01-18 (×3): 5 mg via ORAL
  Filled 2017-01-16 (×3): qty 1

## 2017-01-16 NOTE — Progress Notes (Signed)
01/16/2017 Casey Huerta   13-Feb-1956  [id number]  Primary Physician Gale Journey Damaris Hippo, PA-C Primary Cardiologist: Dr Stanford Breed  HPI:  61 y/o AA female with a history of off pump LIMA-LAD in 2001 for a 90% proximal LAD. Cath in Aug 2015 showed no significant CAD (20% LAD) and an atretic LIMA. She presents to the office today with complaints of two weeks of Rt arm pain and two days of SSCP. She describes it as an ache and pressure. She says it's similar to her pre CABG symptoms. She is nauseated but denies any associated diaphoresis. She hasn't taken NTG because it gives her a headache. In the office she is uncomfortable, holding her arms and chest. Her EKG shows no acute changes.   Current Outpatient Medications  Medication Sig Dispense Refill  . rosuvastatin (CRESTOR) 20 MG tablet Take 1 tablet (20 mg total) by mouth daily. 90 tablet 3  . aspirin 81 MG tablet Take 81 mg by mouth daily.    . diclofenac sodium (VOLTAREN) 1 % GEL APPLY 2 GRAMS TOPICALLY FOUR TIMES A DAY (Patient not taking: Reported on 01/16/2017) 100 g 0  . triamcinolone cream (KENALOG) 0.5 % Apply 1 application topically 2 (two) times daily. To fingers as needed for dry skin (Patient not taking: Reported on 01/16/2017) 45 g 0   No current facility-administered medications for this visit.     Allergies  Allergen Reactions  . Simvastatin Other (See Comments)    REACTION: constipation    Past Medical History:  Diagnosis Date  . Aortic insufficiency    a. Mild-mod AI by echo 2010.  . Breast mass 05/17/2016   dr feels lump inner right quad pt cannot feel lump  . COPD   . CORONARY ARTERY DISEASE    a. CABG (LIMA-->LAD 2001). b. Myoview 2008 no ischemia, EF 63%.  c. cath 10/14/2013 mild 20% ost LAD, atretic LIMA, EF 55%, medical therapy  . HYPERLIPIDEMIA   . HYPERTENSION   . MENORRHAGIA     Social History   Socioeconomic History  . Marital status: Married    Spouse name: Not on file  . Number of children:  Not on file  . Years of education: Not on file  . Highest education level: Not on file  Social Needs  . Financial resource strain: Not on file  . Food insecurity - worry: Not on file  . Food insecurity - inability: Not on file  . Transportation needs - medical: Not on file  . Transportation needs - non-medical: Not on file  Occupational History  . Not on file  Tobacco Use  . Smoking status: Former Smoker    Last attempt to quit: 1980    Years since quitting: 38.9  . Smokeless tobacco: Never Used  . Tobacco comment: QUIT SMOKING IN 1999  Substance and Sexual Activity  . Alcohol use: Yes    Comment: friday nights - 2 glasses  . Drug use: No    Comment:  quit using in 1985  . Sexual activity: Yes    Partners: Male  Other Topics Concern  . Not on file  Social History Narrative   Marital status: married   Children: 3 chidlren and 15 grandchildren   Lives with: husband   Employment: customer service [name]   Tobacco:  wuit   Alcohol:  Friday nights   Drugs:  none   Exercise:  no   Seatbelt: 100%   Guns in home: rifle  Secured: yes     Family History  Problem Relation Age of Onset  . Heart disease Mother   . Heart disease Sister   . Cancer Sister      Review of Systems: General: negative for chills, fever, night sweats or weight changes.  Cardiovascular: negative for chest pain, dyspnea on exertion, edema, orthopnea, palpitations, paroxysmal nocturnal dyspnea or shortness of breath Dermatological: negative for rash Respiratory: negative for cough or wheezing Urologic: negative for hematuria Abdominal: negative for nausea, vomiting, diarrhea, bright red blood per rectum, melena, or hematemesis Neurologic: negative for visual changes, syncope, or dizziness All other systems reviewed and are otherwise negative except as noted above.    Blood pressure (!) 142/74, pulse 79, height 5\' 2"  (1.575 m), weight 143 lb (64.9 kg), SpO2 98 %.  General appearance:  alert, cooperative and mild distress Neck: no carotid bruit and no JVD Lungs: clear to auscultation bilaterally Heart: regular rate and rhythm, she has chest wall tenderness Abdomen: soft, non-tender; bowel sounds normal; no masses,  no organomegaly Extremities: extremities normal, atraumatic, no cyanosis or edema Pulses: 2+ and symmetric Skin: Skin color, texture, turgor normal. No rashes or lesions Neurologic: Grossly normal  EKG NSR, Q V2  ASSESSMENT AND PLAN:   Chest pain with moderate risk of acute coronary syndrome She presents today with 2 days of arm and chest pain, similar to her pre CABG symptoms.  Hx of CABG CABG in 200, off pump LIMA-LAD for 90% LAD stenosis Last cardiac cath 10/12/2013 Normal left ventricular function with an ejection fraction of approximately 55%.  No significant obstructive native coronary artery disease with mild smooth 20% ostial LAD narrowing.  Atretic LIMA graft arising from the subclavian with predominant retrograde filling of the graft via the LAD injection.   Dyslipidemia LDL 66 on Crestor   PLAN  Discussed with Dr Claiborne Billings. This could be coronary spasm. We feel its best to admit her and add Amlodipine. Hydrate, cycle Troponin and plan on cath in am.   Kerin Ransom PA-C 01/16/2017 11:50 AM

## 2017-01-16 NOTE — ED Triage Notes (Signed)
Patient states that she was sent from cardiology to be admitted for cath tomorrow. Has been having arm pain and chest pain intermittently x 2 weeks. Alert and oriented, NAD

## 2017-01-16 NOTE — Patient Instructions (Signed)
Please go to [facility] ED. You will be admitted when bed is available and you have been scheduled for a heart catheterization tomorrow.

## 2017-01-16 NOTE — Assessment & Plan Note (Addendum)
CABG in 200, off pump LIMA-LAD for 90% LAD stenosis Last cardiac cath 10/12/2013 Normal left ventricular function with an ejection fraction of approximately 55%.  No significant obstructive native coronary artery disease with mild smooth 20% ostial LAD narrowing.  Atretic LIMA graft arising from the subclavian with predominant retrograde filling of the graft via the LAD injection.

## 2017-01-16 NOTE — ED Notes (Signed)
Attempted report x1. 

## 2017-01-16 NOTE — ED Provider Notes (Signed)
[facility] EMERGENCY DEPARTMENT Provider Note   CSN: [id number] Arrival date & time: 01/16/17  1231     History   Chief Complaint Chief Complaint  Patient presents with  . Chest Pain    HPI Casey Huerta is a 61 y.o. female.  HPI Patient presents with concern of chest pain. She acknowledges multiple medical issues, including bypass surgery performed 17 years ago. She actually presents today from the cardiology office, where she went after having new illness. About 2 weeks ago she began having episodes of right arm discomfort.  On these were inconsistent, occurring without any clear precipitant, stopping without any intervention. This was persistent until 2 days ago when she began having left upper chest pain as well. This is described as a soreness, tightness. This is currently inconsistent, though occurring spontaneously, stopping without intervention as well. No lightheadedness, no syncope, no vomiting, no diarrhea, no fever, chills. There is mild cough. Today, she went to cardiology, and was sent here for admission, consideration of catheterization.    Past Medical History:  Diagnosis Date  . Aortic insufficiency    a. Mild-mod AI by echo 2010.  . Breast mass 05/17/2016   dr feels lump inner right quad pt cannot feel lump  . COPD   . CORONARY ARTERY DISEASE    a. CABG (LIMA-->LAD 2001). b. Myoview 2008 no ischemia, EF 63%.  c. cath 10/14/2013 mild 20% ost LAD, atretic LIMA, EF 55%, medical therapy  . HYPERLIPIDEMIA   . HYPERTENSION   . MENORRHAGIA     Patient Active Problem List   Diagnosis Date Noted  . Chest pain with moderate risk of acute coronary syndrome 01/16/2017  . Osteoarthritis of both knees 06/14/2016  . Elevated BP without diagnosis of hypertension 06/14/2016  . Aortic insufficiency 03/06/2012  . Dyslipidemia 06/19/2007  . HYPERTENSION 06/19/2007  . Hx of CABG 06/19/2007    Past Surgical History:  Procedure Laterality  Date  . arthroscopic knee surgery    . CESAREAN SECTION    . CORONARY ARTERY BYPASS GRAFT    . LEFT HEART CATHETERIZATION WITH CORONARY/GRAFT ANGIOGRAM N/A 10/12/2013   Procedure: LEFT HEART CATHETERIZATION WITH Beatrix Fetters;  Surgeon: Troy Sine, MD;  Location: [facility] CATH LAB;  Service: Cardiovascular;  Laterality: N/A;    OB History    No data available       Home Medications    Prior to Admission medications   Medication Sig Start Date End Date Taking? Authorizing Provider  aspirin 325 MG tablet Take 325 mg by mouth daily.   Yes [provider]  celecoxib (CELEBREX) 200 MG capsule Take 200 mg by mouth 2 (two) times daily.   Yes [provider]  diclofenac sodium (VOLTAREN) 1 % GEL APPLY 2 GRAMS TOPICALLY FOUR TIMES A DAY Patient taking differently: Apply topically 4 times daily as needed for pain 11/18/16  Yes Weber, Sarah L, PA-C  Multiple Vitamins-Minerals (MULTIVITAMIN WITH MINERALS) tablet Take 1 tablet by mouth daily.   Yes [provider]  rosuvastatin (CRESTOR) 20 MG tablet Take 1 tablet (20 mg total) by mouth daily. 05/28/16  Yes Lelon Perla, MD  triamcinolone cream (KENALOG) 0.5 % Apply 1 application topically 2 (two) times daily. To fingers as needed for dry skin Patient taking differently: Apply 1 application topically 2 (two) times daily as needed (for dry skin).  01/20/16  Yes Weber, Damaris Hippo, PA-C    Family History Family History  Problem Relation Age of Onset  .  Heart disease Mother   . Heart disease Sister   . Cancer Sister     Social History Social History   Tobacco Use  . Smoking status: Former Smoker    Last attempt to quit: 1980    Years since quitting: 38.9  . Smokeless tobacco: Never Used  . Tobacco comment: QUIT SMOKING IN 1999  Substance Use Topics  . Alcohol use: Yes    Comment: friday nights - 2 glasses  . Drug use: No    Comment:  quit using in 1985     Allergies   Simvastatin   Review of  Systems Review of Systems  Constitutional:       Per HPI, otherwise negative  HENT:       Per HPI, otherwise negative  Respiratory:       Per HPI, otherwise negative  Cardiovascular:       Per HPI, otherwise negative  Gastrointestinal: Negative for vomiting.  Endocrine:       Negative aside from HPI  Genitourinary:       Neg aside from HPI   Musculoskeletal:       Per HPI, otherwise negative  Skin: Negative.   Neurological: Negative for syncope.     Physical Exam Updated Vital Signs BP (!) 183/75   Pulse 66   Temp 98 F (36.7 C) (Oral)   Resp 15   Ht 5\' 2"  (1.575 m)   Wt 64.9 kg (143 lb)   SpO2 99%   BMI 26.16 kg/m   Physical Exam  Constitutional: She is oriented to person, place, and time. She appears well-developed and well-nourished. No distress.  HENT:  Head: Normocephalic and atraumatic.  Eyes: Conjunctivae and EOM are normal.  Cardiovascular: Normal rate and regular rhythm.  Pulmonary/Chest: Effort normal and breath sounds normal. No stridor. No respiratory distress.  Abdominal: She exhibits no distension.  Musculoskeletal: She exhibits no edema.  Neurological: She is alert and oriented to person, place, and time. No cranial nerve deficit.  Skin: Skin is warm and dry.  Psychiatric: She has a normal mood and affect.  Nursing note and vitals reviewed.    ED Treatments / Results  Labs (all labs ordered are listed, but only abnormal results are displayed) Labs Reviewed  BASIC METABOLIC PANEL - Abnormal; Notable for the following components:      Result Value   Glucose, Bld 110 (*)    Creatinine, Ser 1.01 (*)    GFR calc non Af Amer 59 (*)    All other components within normal limits  TROPONIN I  CBC WITH DIFFERENTIAL/PLATELET  PROTIME-INR  CBC  TROPONIN I  TROPONIN I  I-STAT TROPONIN, ED  I-STAT BETA HCG BLOOD, ED ([facility], [facility], [facility] ONLY)    EKG  EKG Interpretation  Date/Time:  Thursday January 16 2017 12:41:17 EST Ventricular Rate:  80 PR  Interval:  158 QRS Duration: 80 QT Interval:  400 QTC Calculation: 461 R Axis:   58 Text Interpretation:  Normal sinus rhythm T wave abnormality Abnormal ekg Confirmed by Carmin Muskrat [phone]) on 01/16/2017 2:21:29 PM       Radiology Dg Chest 2 View  Result Date: 01/16/2017 CLINICAL DATA:  61 year old female with right arm pain for 2 weeks and intermittent chest and left arm pain for 2 days. EXAM: CHEST  2 VIEW COMPARISON:  10/12/2013 FINDINGS: Median sternotomy wires again noted. The heart size and mediastinal contours are within normal limits. No focal parenchymal consolidation, pleural effusions or pneumothorax. The visualized skeletal  structures are unremarkable. IMPRESSION: No active cardiopulmonary disease. Electronically Signed   By: [name] M.D.   On: 01/16/2017 13:20    Procedures Procedures (including critical care time)  Medications Ordered in ED Medications  aspirin EC tablet 81 mg (not administered)  nitroGLYCERIN (NITROSTAT) SL tablet 0.4 mg (0.4 mg Sublingual Given 01/16/17 1417)  acetaminophen (TYLENOL) tablet 650 mg (not administered)  ondansetron (ZOFRAN) injection 4 mg (not administered)  enoxaparin (LOVENOX) injection 40 mg (not administered)  0.9 %  sodium chloride infusion (not administered)  ALPRAZolam (XANAX) tablet 0.25 mg (not administered)  zolpidem (AMBIEN) tablet 5 mg (not administered)  rosuvastatin (CRESTOR) tablet 20 mg (not administered)  metoprolol tartrate (LOPRESSOR) tablet 12.5 mg (not administered)  amLODipine (NORVASC) tablet 5 mg (not administered)  ketorolac (TORADOL) 30 MG/ML injection 30 mg (30 mg Intravenous Given 01/16/17 1417)     Initial Impression / Assessment and Plan / ED Course  I have reviewed the triage vital signs and the nursing notes.  Pertinent labs & imaging results that were available during my care of the patient were reviewed by me and considered in my medical decision making (see chart for  details).  Patient awake and alert, in no distress. Hemodynamically unremarkable.  Initial labs reassuring, but given her description of new symptoms consistent with cardiac episodes, patient was admitted to the cardiology team.   Final Clinical Impressions(s) / ED Diagnoses  Atypical chest pain   Carmin Muskrat, MD 01/16/17 1444

## 2017-01-16 NOTE — H&P (Signed)
01/16/2017 Casey Huerta   1955/08/27  [id number]  Primary Physician Gale Journey Damaris Hippo, PA-C Primary Cardiologist: Dr Stanford Breed  CC: Chest and arm pain  HPI:  61 y/o AA female with a history of off pump LIMA-LAD in 2001 for a 90% proximal LAD. Cath in Aug 2015 showed no significant CAD (20% LAD) and an atretic LIMA. She presents to the office today with complaints of two weeks of Rt arm pain and two days of SSCP. She describes it as an ache and pressure. She says it's similar to her pre CABG symptoms. She is nauseated but denies any associated diaphoresis. She hasn't taken NTG because it gives her a headache. In the office she is uncomfortable, holding her arms and chest. Her EKG shows no acute changes.         Current Outpatient Medications  Medication Sig Dispense Refill  . rosuvastatin (CRESTOR) 20 MG tablet Take 1 tablet (20 mg total) by mouth daily. 90 tablet 3  . aspirin 81 MG tablet Take 81 mg by mouth daily.    . diclofenac sodium (VOLTAREN) 1 % GEL APPLY 2 GRAMS TOPICALLY FOUR TIMES A DAY (Patient not taking: Reported on 01/16/2017) 100 g 0  . triamcinolone cream (KENALOG) 0.5 % Apply 1 application topically 2 (two) times daily. To fingers as needed for dry skin (Patient not taking: Reported on 01/16/2017) 45 g 0   No current facility-administered medications for this visit.          Allergies  Allergen Reactions  . Simvastatin Other (See Comments)    REACTION: constipation        Past Medical History:  Diagnosis Date  . Aortic insufficiency    a. Mild-mod AI by echo 2010.  . Breast mass 05/17/2016   dr feels lump inner right quad pt cannot feel lump  . COPD   . CORONARY ARTERY DISEASE    a. CABG (LIMA-->LAD 2001). b. Myoview 2008 no ischemia, EF 63%.  c. cath 10/14/2013 mild 20% ost LAD, atretic LIMA, EF 55%, medical therapy  . HYPERLIPIDEMIA   . HYPERTENSION   . MENORRHAGIA     Social History        Socioeconomic History  .  Marital status: Married    Spouse name: Not on file  . Number of children: Not on file  . Years of education: Not on file  . Highest education level: Not on file  Social Needs  . Financial resource strain: Not on file  . Food insecurity - worry: Not on file  . Food insecurity - inability: Not on file  . Transportation needs - medical: Not on file  . Transportation needs - non-medical: Not on file  Occupational History  . Not on file  Tobacco Use  . Smoking status: Former Smoker    Last attempt to quit: 1980    Years since quitting: 38.9  . Smokeless tobacco: Never Used  . Tobacco comment: QUIT SMOKING IN 1999  Substance and Sexual Activity  . Alcohol use: Yes    Comment: friday nights - 2 glasses  . Drug use: No    Comment:  quit using in 1985  . Sexual activity: Yes    Partners: Male  Other Topics Concern  . Not on file  Social History Narrative   Marital status: married   Children: 3 chidlren and 15 grandchildren   Lives with: husband   Employment: customer service [name]   Tobacco:  wuit   Alcohol:  Friday  nights   Drugs:  none   Exercise:  no   Seatbelt: 100%   Guns in home: rifle       Secured: yes          Family History  Problem Relation Age of Onset  . Heart disease Mother   . Heart disease Sister   . Cancer Sister      Review of Systems: General: negative for chills, fever, night sweats or weight changes.  Cardiovascular: negative for chest pain, dyspnea on exertion, edema, orthopnea, palpitations, paroxysmal nocturnal dyspnea or shortness of breath Dermatological: negative for rash Respiratory: negative for cough or wheezing Urologic: negative for hematuria Abdominal: negative for nausea, vomiting, diarrhea, bright red blood per rectum, melena, or hematemesis Neurologic: negative for visual changes, syncope, or dizziness All other systems reviewed and are otherwise negative except as noted above.    Blood  pressure (!) 142/74, pulse 79, height 5\' 2"  (1.575 m), weight 143 lb (64.9 kg), SpO2 98 %.  General appearance: alert, cooperative and mild distress Neck: no carotid bruit and no JVD Lungs: clear to auscultation bilaterally Heart: regular rate and rhythm, she has chest wall tenderness Abdomen: soft, non-tender; bowel sounds normal; no masses,  no organomegaly Extremities: extremities normal, atraumatic, no cyanosis or edema Pulses: 2+ and symmetric Skin: Skin color, texture, turgor normal. No rashes or lesions Neurologic: Grossly normal  EKG NSR, Q V2  ASSESSMENT AND PLAN:   Chest pain with moderate risk of acute coronary syndrome She presents today with 2 days of arm and chest pain, similar to her pre CABG symptoms.  Hx of CABG CABG in 2001, off pump LIMA-LAD for 90% LAD stenosis Last cardiac cath 10/12/2013 Normal left ventricular function with an ejection fraction of approximately 55%.  No significant obstructive native coronary artery disease with mild smooth 20% ostial LAD narrowing.  Atretic LIMA graft arising from the subclavian with predominant retrograde filling of the graft via the LAD injection.   Dyslipidemia LDL 66 on Crestor   PLAN  Discussed with Dr Claiborne Billings. This could be coronary spasm. We feel its best to admit her and add Amlodipine. Hydrate, cycle Troponin and plan on cath in am.   Casey Ransom PA-C 01/16/2017 11:50 AM  Patient seen and examined. Agree with assessment and plan.  [name] is a 61 year old African American female who I seen in the office today with Casey Huerta.  The patient had undergone CABG revascularization surgery in 2001 with a LIMA graft placed to the LAD by Dr. Cyndia Bent.  In August 2015 she was admitted with nitrate responsive chest pain worrisome for unstable angina.  Cardiac catheterization at that time done by me, showed normal LV function with an EF of 55%.  There was no significant obstructive native CAD with only mild  smooth 20% ostial LAD narrowing.  She had a normal circumflex and RCA.  The LIMA graft was atretic and was predominant retrograde filling of the graft via the LAD  injection.  She presents to the office today complaining of chest pain of similar character to her initial 2001 discomfort.  Her ECG in the office is nondiagnostic.  I agree with physical as above.  She has costochondral chest wall tenderness to palpation but this is not the chest pain that she has been experiencing.  She appears uncomfortable with her chest pain and agree with definitive evaluation with repeat cardiac catheterization.  With her ongoing chest pain.  She will be admitted to the hospital today.  Medical therapy will be initiated to cover potential coronary vasospasm.  She will be scheduled for cardiac catheterization tomorrow.  I have reviewed the risks, indications, and alternatives to cardiac catheterization, possible angioplasty, and stenting with the patient. Risks include but are not limited to bleeding, infection, vascular injury, stroke, myocardial infection, arrhythmia, kidney injury, radiation-related injury in the case of prolonged fluoroscopy use, emergency cardiac surgery, and death. The patient understands the risks of serious complication is 1-2 in 8335 with diagnostic cardiac cath and 1-2% or less with angioplasty/stenting.    Troy Sine, MD, [facility] 01/16/2017 2:44 PM

## 2017-01-16 NOTE — Assessment & Plan Note (Signed)
LDL 66 on Crestor

## 2017-01-16 NOTE — Assessment & Plan Note (Signed)
She presents today with 2 days of arm and chest pain, similar to her pre CABG symptoms.

## 2017-01-17 ENCOUNTER — Encounter (HOSPITAL_COMMUNITY)
Admission: EM | Disposition: A | Discharge status: Home / Self Care | Payer: Self-pay | Source: Home / Self Care | Attending: Emergency Medicine

## 2017-01-17 ENCOUNTER — Ambulatory Visit (HOSPITAL_COMMUNITY)
Admission: RE | Admit: 2017-01-17 | Payer: Managed Care, Other (non HMO) | Source: Ambulatory Visit | Admitting: Interventional Cardiology

## 2017-01-17 DIAGNOSIS — E782 Mixed hyperlipidemia: Secondary | ICD-10-CM

## 2017-01-17 DIAGNOSIS — R079 Chest pain, unspecified: Secondary | ICD-10-CM | POA: Diagnosis not present

## 2017-01-17 HISTORY — PX: LEFT HEART CATH AND CORONARY ANGIOGRAPHY: CATH118249

## 2017-01-17 LAB — CREATININE, SERUM: Creatinine, Ser: 0.94 mg/dL (ref 0.44–1.00)

## 2017-01-17 LAB — CBC
HEMATOCRIT: 41.2 % (ref 36.0–46.0)
Hemoglobin: 13.5 g/dL (ref 12.0–15.0)
MCH: 29.5 pg (ref 26.0–34.0)
MCHC: 32.8 g/dL (ref 30.0–36.0)
MCV: 90 fL (ref 78.0–100.0)
PLATELETS: 199 10*3/uL (ref 150–400)
RBC: 4.58 MIL/uL (ref 3.87–5.11)
RDW: 12.3 % (ref 11.5–15.5)
WBC: 4.4 10*3/uL (ref 4.0–10.5)

## 2017-01-17 LAB — TROPONIN I: Troponin I: <0.03 ng/mL (ref <0.03)

## 2017-01-17 SURGERY — LEFT HEART CATH AND CORONARY ANGIOGRAPHY
Anesthesia: LOCAL

## 2017-01-17 MED ORDER — ASPIRIN 81 MG PO CHEW
81.0000 mg | CHEWABLE_TABLET | Freq: Every day | ORAL | Status: DC
  Administered 2017-01-18: 81 mg via ORAL
  Filled 2017-01-17: qty 1

## 2017-01-17 MED ORDER — MIDAZOLAM HCL 2 MG/2ML IJ SOLN
INTRAMUSCULAR | Status: DC | PRN
  Administered 2017-01-17: 1 mg via INTRAVENOUS

## 2017-01-17 MED ORDER — SODIUM CHLORIDE 0.9 % IV SOLN: 250.0000 mL | INTRAVENOUS | Status: DC | PRN

## 2017-01-17 MED ORDER — HYDRALAZINE HCL 20 MG/ML IJ SOLN: 5.0000 mg | INTRAMUSCULAR | Status: AC | PRN

## 2017-01-17 MED ORDER — SODIUM CHLORIDE 0.9% FLUSH
3.0000 mL | Freq: Two times a day (BID) | INTRAVENOUS | Status: DC
  Administered 2017-01-18: 3 mL via INTRAVENOUS

## 2017-01-17 MED ORDER — ENOXAPARIN SODIUM 40 MG/0.4ML ~~LOC~~ SOLN: 40.0000 mg | SUBCUTANEOUS | Status: DC

## 2017-01-17 MED ORDER — SODIUM CHLORIDE 0.9 % IV SOLN: INTRAVENOUS | Status: AC

## 2017-01-17 MED ORDER — ONDANSETRON HCL 4 MG/2ML IJ SOLN: 4.0000 mg | Freq: Four times a day (QID) | INTRAMUSCULAR | Status: DC | PRN

## 2017-01-17 MED ORDER — HEPARIN (PORCINE) IN NACL 2-0.9 UNIT/ML-% IJ SOLN
INTRAMUSCULAR | Status: AC | PRN
  Administered 2017-01-17: 1000 mL

## 2017-01-17 MED ORDER — LIDOCAINE HCL (PF) 1 % IJ SOLN
INTRAMUSCULAR | Status: DC | PRN
  Administered 2017-01-17: 2 mL

## 2017-01-17 MED ORDER — HEPARIN SODIUM (PORCINE) 1000 UNIT/ML IJ SOLN
INTRAMUSCULAR | Status: DC | PRN
  Administered 2017-01-17: 4000 [IU] via INTRAVENOUS

## 2017-01-17 MED ORDER — LABETALOL HCL 5 MG/ML IV SOLN: 10.0000 mg | INTRAVENOUS | Status: AC | PRN

## 2017-01-17 MED ORDER — ACETAMINOPHEN 325 MG PO TABS
650.0000 mg | ORAL_TABLET | ORAL | Status: DC | PRN
  Administered 2017-01-18: 650 mg via ORAL
  Filled 2017-01-17: qty 2

## 2017-01-17 MED ORDER — IOPAMIDOL (ISOVUE-370) INJECTION 76%
INTRAVENOUS | Status: DC | PRN
  Administered 2017-01-17: 80 mL via INTRA_ARTERIAL

## 2017-01-17 MED ORDER — VERAPAMIL HCL 2.5 MG/ML IV SOLN
INTRAVENOUS | Status: DC | PRN
  Administered 2017-01-17: 18:00:00 via INTRA_ARTERIAL

## 2017-01-17 MED ORDER — NITROGLYCERIN 1 MG/10 ML FOR IR/CATH LAB
INTRA_ARTERIAL | Status: DC | PRN
  Administered 2017-01-17: 200 ug via INTRACORONARY

## 2017-01-17 MED ORDER — FENTANYL CITRATE (PF) 100 MCG/2ML IJ SOLN
INTRAMUSCULAR | Status: DC | PRN
  Administered 2017-01-17: 50 ug via INTRAVENOUS

## 2017-01-17 MED ORDER — SODIUM CHLORIDE 0.9% FLUSH: 3.0000 mL | INTRAVENOUS | Status: DC | PRN

## 2017-01-17 SURGICAL SUPPLY — 12 items
CATH INFINITI 5 FR JL3.5 (CATHETERS) ×1 IMPLANT
CATH INFINITI JR4 5F (CATHETERS) ×1 IMPLANT
COVER PRB 48X5XTLSCP FOLD TPE (BAG) IMPLANT
COVER PROBE 5X48 (BAG) ×2
DEVICE RAD COMP TR BAND LRG (VASCULAR PRODUCTS) ×1 IMPLANT
GLIDESHEATH SLEND A-KIT 6F 22G (SHEATH) ×1 IMPLANT
GUIDEWIRE INQWIRE 1.5J.035X260 (WIRE) IMPLANT
INQWIRE 1.5J .035X260CM (WIRE) ×2
KIT HEART LEFT (KITS) ×2 IMPLANT
PACK CARDIAC CATHETERIZATION (CUSTOM PROCEDURE TRAY) ×2 IMPLANT
TRANSDUCER W/STOPCOCK (MISCELLANEOUS) ×2 IMPLANT
TUBING CIL FLEX 10 FLL-RA (TUBING) ×2 IMPLANT

## 2017-01-17 NOTE — H&P (View-Only) (Signed)
Progress Note  Patient Name: Casey Huerta Date of Encounter: 01/17/2017  Primary Cardiologist: Dr Stanford Breed  Subjective   Intermittent chest discomfort. No dyspnea or palpitations.   Inpatient Medications    Scheduled Meds: . amLODipine  5 mg Oral Daily  . aspirin EC  81 mg Oral Daily  . enoxaparin (LOVENOX) injection  40 mg Subcutaneous Q24H  . metoprolol tartrate  12.5 mg Oral BID  . rosuvastatin  20 mg Oral q1800   Continuous Infusions: . sodium chloride    . sodium chloride 1 mL/kg/hr (01/17/17 0407)   PRN Meds: sodium chloride, acetaminophen, ALPRAZolam, nitroGLYCERIN, ondansetron (ZOFRAN) IV, oxyCODONE-acetaminophen, zolpidem   Vital Signs    Vitals:   01/16/17 2000 01/17/17 0025 01/17/17 0430 01/17/17 0833  BP:  (!) 119/47 113/64 (!) 142/63  Pulse:  69 71 72  Resp:  18 18   Temp:  98.3 F (36.8 C) 99 F (37.2 C) 98.1 F (36.7 C)  TempSrc:  Oral Oral Oral  SpO2:  96% 95% 94%  Weight: 139 lb 3.2 oz (63.1 kg)  139 lb 3.2 oz (63.1 kg)   Height: 5\' 2"  (1.575 m)       Intake/Output Summary (Last 24 hours) at 01/17/2017 0857 Last data filed at 01/17/2017 6789 Gross per 24 hour  Intake 137.37 ml  Output -  Net 137.37 ml   Filed Weights   01/16/17 1243 01/16/17 2000 01/17/17 0430  Weight: 143 lb (64.9 kg) 139 lb 3.2 oz (63.1 kg) 139 lb 3.2 oz (63.1 kg)    Telemetry    SR with rare PVCs - Personally Reviewed  ECG    Sr with non specific TW abnormality - Personally Reviewed  Physical Exam   GEN: No acute distress.   Neck: No JVD Cardiac: RRR, no murmurs, rubs, or gallops.  Respiratory: Clear to auscultation bilaterally. GI: Soft, nontender, non-distended  MS: No edema; No deformity. Neuro:  Nonfocal  Psych: Normal affect   Labs    Chemistry Recent Labs  Lab 01/16/17 1247  NA 141  K 4.0  CL 108  CO2 25  GLUCOSE 110*  BUN 10  CREATININE 1.01*  CALCIUM 9.6  GFRNONAA 59*  GFRAA >60  ANIONGAP 8     Hematology Recent Labs    Lab 01/16/17 1247  WBC 5.1  RBC 4.65  HGB 13.8  HCT 42.2  MCV 90.8  MCH 29.7  MCHC 32.7  RDW 12.3  PLT 198    Cardiac Enzymes Recent Labs  Lab 01/16/17 1247 01/16/17 1848 01/17/17 0024  TROPONINI <0.03 <0.03 <0.03    Recent Labs  Lab 01/16/17 1314  TROPIPOC 0.00     Radiology    Dg Chest 2 View  Result Date: 01/16/2017 CLINICAL DATA:  61 year old female with right arm pain for 2 weeks and intermittent chest and left arm pain for 2 days. EXAM: CHEST  2 VIEW COMPARISON:  10/12/2013 FINDINGS: Median sternotomy wires again noted. The heart size and mediastinal contours are within normal limits. No focal parenchymal consolidation, pleural effusions or pneumothorax. The visualized skeletal structures are unremarkable. IMPRESSION: No active cardiopulmonary disease. Electronically Signed   By: [name] M.D.   On: 01/16/2017 13:20    Cardiac Studies   Pending cath today  Patient Profile     62 y.o. female female with a history of off pump LIMA-LAD in 2001 for a 90% proximal LAD and HLD admitted from clinic yesterday with symptoms concerning for unstable angina.   Last cath  in Aug 2015 showed no significant CAD (20% LAD) and an atretic LIMA. She presents to the office 01/17/47 with complaints of two weeks of Rt arm pain and two days of SSCP. She describes it as an ache and pressure. She says it's similar to her pre CABG symptoms. She is nauseated but denies any associated diaphoresis. Started on Norvasc for possible Coronary vasospasm. Wait cath.    Assessment & Plan     1. Chest pain - Continues to have intermittent chest discomfort. Norvasc added for possible vasospasm during admission. Troponin remained negative. Pending cath.   2. CAD s/p CABG in 2001off pump LIMA-LAD for 90% LAD stenosis - Continue ASA, statin and BB  3. HLD - 07/30/2016: Cholesterol 153; HDL 61; LDL Cholesterol 66; Triglycerides 129; VLDL 26  - Continue statin    For questions or  updates, please contact [place] Please consult [email or web address] for contact info under Cardiology/STEMI.      Signed, Leanor Kail, PA  01/17/2017, 8:57 AM    I have examined the patient and reviewed assessment and plan and discussed with patient.  Agree with above as stated.  Await cath results to determine further plans.  All questions about cath answered.  2+ left radial pulse.   Larae Grooms

## 2017-01-17 NOTE — Progress Notes (Signed)
Progress Note  Patient Name: FANTASHA DANIELE Date of Encounter: 01/17/2017  Primary Cardiologist: Dr Stanford Breed  Subjective   Intermittent chest discomfort. No dyspnea or palpitations.   Inpatient Medications    Scheduled Meds: . amLODipine  5 mg Oral Daily  . aspirin EC  81 mg Oral Daily  . enoxaparin (LOVENOX) injection  40 mg Subcutaneous Q24H  . metoprolol tartrate  12.5 mg Oral BID  . rosuvastatin  20 mg Oral q1800   Continuous Infusions: . sodium chloride    . sodium chloride 1 mL/kg/hr (01/17/17 0407)   PRN Meds: sodium chloride, acetaminophen, ALPRAZolam, nitroGLYCERIN, ondansetron (ZOFRAN) IV, oxyCODONE-acetaminophen, zolpidem   Vital Signs    Vitals:   01/16/17 2000 01/17/17 0025 01/17/17 0430 01/17/17 0833  BP:  (!) 119/47 113/64 (!) 142/63  Pulse:  69 71 72  Resp:  18 18   Temp:  98.3 F (36.8 C) 99 F (37.2 C) 98.1 F (36.7 C)  TempSrc:  Oral Oral Oral  SpO2:  96% 95% 94%  Weight: 139 lb 3.2 oz (63.1 kg)  139 lb 3.2 oz (63.1 kg)   Height: 5\' 2"  (1.575 m)       Intake/Output Summary (Last 24 hours) at 01/17/2017 0857 Last data filed at 01/17/2017 2725 Gross per 24 hour  Intake 137.37 ml  Output -  Net 137.37 ml   Filed Weights   01/16/17 1243 01/16/17 2000 01/17/17 0430  Weight: 143 lb (64.9 kg) 139 lb 3.2 oz (63.1 kg) 139 lb 3.2 oz (63.1 kg)    Telemetry    SR with rare PVCs - Personally Reviewed  ECG    Sr with non specific TW abnormality - Personally Reviewed  Physical Exam   GEN: No acute distress.   Neck: No JVD Cardiac: RRR, no murmurs, rubs, or gallops.  Respiratory: Clear to auscultation bilaterally. GI: Soft, nontender, non-distended  MS: No edema; No deformity. Neuro:  Nonfocal  Psych: Normal affect   Labs    Chemistry Recent Labs  Lab 01/16/17 1247  NA 141  K 4.0  CL 108  CO2 25  GLUCOSE 110*  BUN 10  CREATININE 1.01*  CALCIUM 9.6  GFRNONAA 59*  GFRAA >60  ANIONGAP 8     Hematology Recent Labs    Lab 01/16/17 1247  WBC 5.1  RBC 4.65  HGB 13.8  HCT 42.2  MCV 90.8  MCH 29.7  MCHC 32.7  RDW 12.3  PLT 198    Cardiac Enzymes Recent Labs  Lab 01/16/17 1247 01/16/17 1848 01/17/17 0024  TROPONINI <0.03 <0.03 <0.03    Recent Labs  Lab 01/16/17 1314  TROPIPOC 0.00     Radiology    Dg Chest 2 View  Result Date: 01/16/2017 CLINICAL DATA:  61 year old female with right arm pain for 2 weeks and intermittent chest and left arm pain for 2 days. EXAM: CHEST  2 VIEW COMPARISON:  10/12/2013 FINDINGS: Median sternotomy wires again noted. The heart size and mediastinal contours are within normal limits. No focal parenchymal consolidation, pleural effusions or pneumothorax. The visualized skeletal structures are unremarkable. IMPRESSION: No active cardiopulmonary disease. Electronically Signed   By: [name] M.D.   On: 01/16/2017 13:20    Cardiac Studies   Pending cath today  Patient Profile     61 y.o. female female with a history of off pump LIMA-LAD in 2001 for a 90% proximal LAD and HLD admitted from clinic yesterday with symptoms concerning for unstable angina.   Last cath  in Aug 2015 showed no significant CAD (20% LAD) and an atretic LIMA. She presents to the office 01/17/47 with complaints of two weeks of Rt arm pain and two days of SSCP. She describes it as an ache and pressure. She says it's similar to her pre CABG symptoms. She is nauseated but denies any associated diaphoresis. Started on Norvasc for possible Coronary vasospasm. Wait cath.    Assessment & Plan     1. Chest pain - Continues to have intermittent chest discomfort. Norvasc added for possible vasospasm during admission. Troponin remained negative. Pending cath.   2. CAD s/p CABG in 2001off pump LIMA-LAD for 90% LAD stenosis - Continue ASA, statin and BB  3. HLD - 07/30/2016: Cholesterol 153; HDL 61; LDL Cholesterol 66; Triglycerides 129; VLDL 26  - Continue statin    For questions or  updates, please contact [place] Please consult [email or web address] for contact info under Cardiology/STEMI.      Signed, Leanor Kail, PA  01/17/2017, 8:57 AM    I have examined the patient and reviewed assessment and plan and discussed with patient.  Agree with above as stated.  Await cath results to determine further plans.  All questions about cath answered.  2+ left radial pulse.   Larae Grooms

## 2017-01-17 NOTE — Interval H&P Note (Signed)
History and Physical Interval Note:  01/17/2017 5:36 PM  Casey Huerta  has presented today for surgery, with the diagnosis of CP  The various methods of treatment have been discussed with the patient and family. After consideration of risks, benefits and other options for treatment, the patient has consented to  Procedure(s): LEFT HEART CATH AND CORONARY ANGIOGRAPHY (N/A) as a surgical intervention .  The patient's history has been reviewed, patient examined, no change in status, stable for surgery.  I have reviewed the patient's chart and labs.  Questions were answered to the patient's satisfaction.     [name]

## 2017-01-18 DIAGNOSIS — R079 Chest pain, unspecified: Secondary | ICD-10-CM | POA: Diagnosis not present

## 2017-01-18 DIAGNOSIS — I25701 Atherosclerosis of coronary artery bypass graft(s), unspecified, with angina pectoris with documented spasm: Secondary | ICD-10-CM | POA: Diagnosis not present

## 2017-01-18 LAB — BASIC METABOLIC PANEL WITH GFR
Anion gap: 7 (ref 5–15)
BUN: 12 mg/dL (ref 6–20)
CO2: 23 mmol/L (ref 22–32)
Calcium: 9.1 mg/dL (ref 8.9–10.3)
Chloride: 109 mmol/L (ref 101–111)
Creatinine, Ser: 0.94 mg/dL (ref 0.44–1.00)
GFR calc Af Amer: >60 mL/min
GFR calc non Af Amer: >60 mL/min
Glucose, Bld: 92 mg/dL (ref 65–99)
Potassium: 3.9 mmol/L (ref 3.5–5.1)
Sodium: 139 mmol/L (ref 135–145)

## 2017-01-18 LAB — CBC
HEMATOCRIT: 39.8 % (ref 36.0–46.0)
HEMOGLOBIN: 13 g/dL (ref 12.0–15.0)
MCH: 29.5 pg (ref 26.0–34.0)
MCHC: 32.7 g/dL (ref 30.0–36.0)
MCV: 90.5 fL (ref 78.0–100.0)
Platelets: 195 10*3/uL (ref 150–400)
RBC: 4.4 MIL/uL (ref 3.87–5.11)
RDW: 12.3 % (ref 11.5–15.5)
WBC: 4.4 10*3/uL (ref 4.0–10.5)

## 2017-01-18 MED ORDER — ASPIRIN 81 MG PO CHEW: 81.0000 mg | CHEWABLE_TABLET | Freq: Every day | ORAL | 4 refills | Status: DC

## 2017-01-18 MED ORDER — NITROGLYCERIN 0.4 MG SL SUBL: 0.4000 mg | SUBLINGUAL_TABLET | SUBLINGUAL | 1 refills | Status: DC | PRN

## 2017-01-18 MED ORDER — AMLODIPINE BESYLATE 5 MG PO TABS: 5.0000 mg | ORAL_TABLET | Freq: Every day | ORAL | 4 refills | Status: DC

## 2017-01-18 NOTE — Discharge Summary (Signed)
Ledora Bottcher, [place]  Physician Assistant  Cardiology  Progress Notes  Cosign Needed  Date of Service:  01/18/2017 12:19 PM          Cosign Needed      Expand All Collapse All      [] Hide copied text  [] Hover for details       Discharge Summary    Patient ID: Casey Huerta,  MRN: [id number], DOB/AGE: August 10, 1955 61 y.o.  Admit date: 01/16/2017 Discharge date: 01/18/2017   Primary Care Provider: Mancel Bale Primary Cardiologist: Dr. Stanford Breed  Discharge Diagnoses    Principal Problem:   Chest pain with moderate risk of acute coronary syndrome Active Problems:   Dyslipidemia   Essential hypertension   Hx of CABG   Aortic insufficiency   Allergies      Allergies  Allergen Reactions  . Simvastatin Other (See Comments)    REACTION: constipation     History of Present Illness     61 y/o AA female with a history of off pump LIMA-LAD in 2001 for a 90% proximal LAD. Cath in Aug 2015 showed no significant CAD (20% LAD) and an atretic LIMA. She presents to the office today with complaints of two weeks of Rt arm pain and two days of SSCP. She describes it as an ache and pressure. She says it's similar to her pre CABG symptoms. She is nauseated but denies any associated diaphoresis. She hasn't taken NTG because it gives her a headache. In the office she is uncomfortable, holding her arms and chest. Her EKG shows no acute changes.   Hospital Course     Consultants: none  The decision was made to admit her to cardiology for heart catheterization. Heart cath 01/17/17 with stable coronary anatomy compared to 2015 cath, including persistent proximal occlusion of the LIMA with a widely patent LAD with ostial irregularity. Chest pain may be related to MSK, GI, or spasm. Norvasc started for possible spasm.   Today, pt denies further angina. ASA and statin continued.  Patient seen and examined by Dr. Sallyanne Kuster today and was stable for discharge. All  follow up has been arranged.  _____________  Discharge Vitals Blood pressure (!) 109/53, pulse 80, temperature 98.8 F (37.1 C), temperature source Oral, resp. rate 16, height 5\' 2"  (1.575 m), weight 139 lb 4.8 oz (63.2 kg), SpO2 97 %.       Filed Weights   01/16/17 2000 01/17/17 0430 01/18/17 0503  Weight: 139 lb 3.2 oz (63.1 kg) 139 lb 3.2 oz (63.1 kg) 139 lb 4.8 oz (63.2 kg)    Labs & Radiologic Studies    CBC RecentLabs(last2labs)       Recent Labs    01/16/17 1247 01/17/17 1842 01/18/17 0317  WBC 5.1 4.4 4.4  NEUTROABS 2.4  --   --   HGB 13.8 13.5 13.0  HCT 42.2 41.2 39.8  MCV 90.8 90.0 90.5  PLT 198 199 195     Basic Metabolic Panel [id number])  Recent Labs    01/16/17 1247 01/17/17 1842 01/18/17 0317  NA 141  --  139  K 4.0  --  3.9  CL 108  --  109  CO2 25  --  23  GLUCOSE 110*  --  92  BUN 10  --  12  CREATININE 1.01* 0.94 0.94  CALCIUM 9.6  --  9.1     Liver Function Tests RecentLabs(last2labs)  No results for input(s): AST, ALT, ALKPHOS, BILITOT, PROT, ALBUMIN in the  last 72 hours.   RecentLabs(last2labs)  No results for input(s): LIPASE, AMYLASE in the last 72 hours.   Cardiac Enzymes RecentLabs(last2labs)       Recent Labs    01/16/17 1247 01/16/17 1848 01/17/17 0024  TROPONINI <0.03 <0.03 <0.03     BNP RecentLabs(last2labs)  Invalid input(s): POCBNP   D-Dimer RecentLabs(last2labs)  No results for input(s): DDIMER in the last 72 hours.   Hemoglobin A1C RecentLabs(last2labs)  No results for input(s): HGBA1C in the last 72 hours.   Fasting Lipid Panel RecentLabs(last2labs)  No results for input(s): CHOL, HDL, LDLCALC, TRIG, CHOLHDL, LDLDIRECT in the last 72 hours.   Thyroid Function Tests  RecentLabs(last2labs)  No results for input(s): TSH, T4TOTAL, T3FREE, THYROIDAB in the last 72 hours.  Invalid input(s): FREET3   _____________   ImagingResults   Dg Chest 2 View  Result Date: 01/16/2017 CLINICAL DATA:  61 year old female with right arm pain for 2 weeks and intermittent chest and left arm pain for 2 days. EXAM: CHEST  2 VIEW COMPARISON:  10/12/2013 FINDINGS: Median sternotomy wires again noted. The heart size and mediastinal contours are within normal limits. No focal parenchymal consolidation, pleural effusions or pneumothorax. The visualized skeletal structures are unremarkable. IMPRESSION: No active cardiopulmonary disease. Electronically Signed   By: [name] M.D.   On: 01/16/2017 13:20      Diagnostic Studies/Procedures    Left heart cath 01/17/17:  Codominant coronary anatomy.  Normal left main.  20-25% ostial/proximal left anterior descending. LAD is large and wraps the left ventricular apex. No significant obstruction is seen.  Large circumflex without evidence of significant obstructive disease.  Codominant right coronary with no evidence of obstruction.  Normal left ventricular function with EF 55%. LVEDP 12 mmHg  Occluded proximal LIMA to LAD identified by retrograde flow of LIMA from LAD to proximal segment where total occlusion exists.  Angiography is unchanged from study done in 2015.  RECOMMENDATIONS:   Stable coronary anatomy compared to 2015. 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 LAD is widely patent with ostial irregularity. Chest pain is likely musculoskeletal/GI/other.  Diagnostic Diagram          Disposition   Pt is being discharged home today in good condition.  Follow-up Plans & Appointments    Office will call with appt.     Discharge Instructions    Diet - low sodium heart healthy   Complete by:  As directed    Increase activity slowly   Complete by:  As directed       Discharge Medications       Allergies as of 01/18/2017      Reactions   Simvastatin Other (See Comments)   REACTION: constipation               Medication List     STOP taking these medications   aspirin 325 MG tablet Replaced by:  aspirin 81 MG chewable tablet   celecoxib 200 MG capsule Commonly known as:  CELEBREX   diclofenac sodium 1 % Gel Commonly known as:  VOLTAREN     TAKE these medications   amLODipine 5 MG tablet Commonly known as:  NORVASC Take 1 tablet (5 mg total) by mouth daily. Start taking on:  01/19/2017   aspirin 81 MG chewable tablet Chew 1 tablet (81 mg total) by mouth daily. Start taking on:  01/19/2017 Replaces:  aspirin 325 MG tablet   multivitamin with minerals tablet Take 1 tablet by mouth daily.   nitroGLYCERIN  0.4 MG SL tablet Commonly known as:  NITROSTAT Place 1 tablet (0.4 mg total) under the tongue every 5 (five) minutes x 3 doses as needed for chest pain.   rosuvastatin 20 MG tablet Commonly known as:  CRESTOR Take 1 tablet (20 mg total) by mouth daily.   triamcinolone cream 0.5 % Commonly known as:  KENALOG Apply 1 application topically 2 (two) times daily. To fingers as needed for dry skin What changed:    when to take this  reasons to take this  additional instructions        Outstanding Labs/Studies   Follow up in clinic 2-3 weeks, staff message sent to [facility]  Duration of Discharge Encounter   Greater than 30 minutes including physician time.  Signed, Tami Lin Elliemae Braman PA-C 01/18/2017, 12:58 PM

## 2017-01-18 NOTE — Progress Notes (Signed)
Progress Note  Patient Name: ADELLYN CAPEK Date of Encounter: 01/18/2017  Primary Cardiologist: Stanford Breed  Subjective   No further angina overnight.  Mild discomfort in the middle of right forearm.  No hematoma no bleeding.  Inpatient Medications    Scheduled Meds: . amLODipine  5 mg Oral Daily  . aspirin  81 mg Oral Daily  . enoxaparin (LOVENOX) injection  40 mg Subcutaneous Q24H  . metoprolol tartrate  12.5 mg Oral BID  . rosuvastatin  20 mg Oral q1800  . sodium chloride flush  3 mL Intravenous Q12H   Continuous Infusions: . sodium chloride    . sodium chloride     PRN Meds: sodium chloride, sodium chloride, acetaminophen, ALPRAZolam, nitroGLYCERIN, ondansetron (ZOFRAN) IV, oxyCODONE-acetaminophen, sodium chloride flush, zolpidem   Vital Signs    Vitals:   01/17/17 2356 01/18/17 0503 01/18/17 0831 01/18/17 0848  BP: (!) 112/58 129/60 (!) 109/53   Pulse: 68 68 78 80  Resp: 14 16    Temp: 98.2 F (36.8 C) 98.5 F (36.9 C)  98.8 F (37.1 C)  TempSrc: Oral Oral  Oral  SpO2: 95% 98%  97%  Weight:  139 lb 4.8 oz (63.2 kg)    Height:        Intake/Output Summary (Last 24 hours) at 01/18/2017 1201 Last data filed at 01/18/2017 0100 Gross per 24 hour  Intake 3 ml  Output -  Net 3 ml   Filed Weights   01/16/17 2000 01/17/17 0430 01/18/17 0503  Weight: 139 lb 3.2 oz (63.1 kg) 139 lb 3.2 oz (63.1 kg) 139 lb 4.8 oz (63.2 kg)    Telemetry    Sinus rhythm- Personally Reviewed  ECG    Sinus rhythm, minor nonspecific T wave changes, otherwise normal tracing- Personally Reviewed  Physical Exam  Appears very comfortable.   GEN: No acute distress.   Neck: No JVD Cardiac: RRR, no murmurs, rubs, or gallops.  Healthy right radial Access site without hematoma excellent pulse.  Mildly sore forearm. Respiratory: Clear to auscultation bilaterally. GI: Soft, nontender, non-distended  MS: No edema; No deformity. Neuro:  Nonfocal  Psych: Normal affect   Labs      Chemistry Recent Labs  Lab 01/16/17 1247 01/17/17 1842 01/18/17 0317  NA 141  --  139  K 4.0  --  3.9  CL 108  --  109  CO2 25  --  23  GLUCOSE 110*  --  92  BUN 10  --  12  CREATININE 1.01* 0.94 0.94  CALCIUM 9.6  --  9.1  GFRNONAA 59* >60 >60  GFRAA >60 >60 >60  ANIONGAP 8  --  7     Hematology Recent Labs  Lab 01/16/17 1247 01/17/17 1842 01/18/17 0317  WBC 5.1 4.4 4.4  RBC 4.65 4.58 4.40  HGB 13.8 13.5 13.0  HCT 42.2 41.2 39.8  MCV 90.8 90.0 90.5  MCH 29.7 29.5 29.5  MCHC 32.7 32.8 32.7  RDW 12.3 12.3 12.3  PLT 198 199 195    Cardiac Enzymes Recent Labs  Lab 01/16/17 1247 01/16/17 1848 01/17/17 0024  TROPONINI <0.03 <0.03 <0.03    Recent Labs  Lab 01/16/17 1314  TROPIPOC 0.00     BNPNo results for input(s): BNP, PROBNP in the last 168 hours.   DDimer No results for input(s): DDIMER in the last 168 hours.   Radiology    Dg Chest 2 View  Result Date: 01/16/2017 CLINICAL DATA:  61 year old female with right arm  pain for 2 weeks and intermittent chest and left arm pain for 2 days. EXAM: CHEST  2 VIEW COMPARISON:  10/12/2013 FINDINGS: Median sternotomy wires again noted. The heart size and mediastinal contours are within normal limits. No focal parenchymal consolidation, pleural effusions or pneumothorax. The visualized skeletal structures are unremarkable. IMPRESSION: No active cardiopulmonary disease. Electronically Signed   By: [name] M.D.   On: 01/16/2017 13:20    Cardiac Studies  CATH 01/17/2017  Codominant coronary anatomy.  Normal left main.  20-25% ostial/proximal left anterior descending.  LAD is large and wraps the left ventricular apex.  No significant obstruction is seen.  Large circumflex without evidence of significant obstructive disease.  Codominant right coronary with no evidence of obstruction.  Normal left ventricular function with EF 55%.  LVEDP 12 mmHg  Occluded proximal LIMA to LAD identified by retrograde flow  of LIMA from LAD to proximal segment where total occlusion exists.  Angiography is unchanged from study done in 2015.  RECOMMENDATIONS:   Stable coronary anatomy compared to 2015.  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  LAD is widely patent with ostial irregularity.  Chest pain is likely musculoskeletal/GI/other.  Patient Profile     61 y.o. female with a recurrent chest discomfort despite widely patent LAD artery (occluded LIMA to LAD), suspicion of coronary vasospasm.  Assessment & Plan    Discharge home today with amlodipine added for empirical treatment of possible coronary vasospasm.  Arrange follow-up visit in 3-4 weeks.  Continue aspirin and statin.  For questions or updates, please contact [place] Please consult [email or web address] for contact info under Cardiology/STEMI.      Signed, Sanda Klein, MD  01/18/2017, 12:01 PM

## 2017-01-18 NOTE — Progress Notes (Deleted)
Discharge Summary    Patient ID: Casey Huerta,  MRN: [id number], DOB/AGE: 03/26/1955 61 y.o.  Admit date: 01/16/2017 Discharge date: 01/18/2017   Primary Care Provider: Mancel Bale Primary Cardiologist: Dr. Stanford Breed  Discharge Diagnoses    Principal Problem:   Chest pain with moderate risk of acute coronary syndrome Active Problems:   Dyslipidemia   Essential hypertension   Hx of CABG   Aortic insufficiency   Allergies Allergies  Allergen Reactions  . Simvastatin Other (See Comments)    REACTION: constipation     History of Present Illness     61 y/o AA female with a history of off pump LIMA-LAD in 2001 for a 90% proximal LAD. Cath in Aug 2015 showed no significant CAD (20% LAD) and an atretic LIMA. She presents to the office today with complaints of two weeks of Rt arm pain and two days of SSCP. She describes it as an ache and pressure. She says it's similar to her pre CABG symptoms. She is nauseated but denies any associated diaphoresis. She hasn't taken NTG because it gives her a headache. In the office she is uncomfortable, holding her arms and chest. Her EKG shows no acute changes.   Hospital Course     Consultants: none  The decision was made to admit her to cardiology for heart catheterization. Heart cath 01/17/17 with stable coronary anatomy compared to 2015 cath, including persistent proximal occlusion of the LIMA with a widely patent LAD with ostial irregularity. Chest pain may be related to MSK, GI, or spasm. Norvasc started for possible spasm.   Today, pt denies further angina. ASA and statin continued.  Patient seen and examined by Dr. Sallyanne Kuster today and was stable for discharge. All follow up has been arranged.  _____________  Discharge Vitals Blood pressure (!) 109/53, pulse 80, temperature 98.8 F (37.1 C), temperature source Oral, resp. rate 16, height 5\' 2"  (1.575 m), weight 139 lb 4.8 oz (63.2 kg), SpO2 97 %.  Filed Weights   01/16/17  2000 01/17/17 0430 01/18/17 0503  Weight: 139 lb 3.2 oz (63.1 kg) 139 lb 3.2 oz (63.1 kg) 139 lb 4.8 oz (63.2 kg)    Labs & Radiologic Studies    CBC Recent Labs    01/16/17 1247 01/17/17 1842 01/18/17 0317  WBC 5.1 4.4 4.4  NEUTROABS 2.4  --   --   HGB 13.8 13.5 13.0  HCT 42.2 41.2 39.8  MCV 90.8 90.0 90.5  PLT 198 199 425   Basic Metabolic Panel Recent Labs    01/16/17 1247 01/17/17 1842 01/18/17 0317  NA 141  --  139  K 4.0  --  3.9  CL 108  --  109  CO2 25  --  23  GLUCOSE 110*  --  92  BUN 10  --  12  CREATININE 1.01* 0.94 0.94  CALCIUM 9.6  --  9.1   Liver Function Tests No results for input(s): AST, ALT, ALKPHOS, BILITOT, PROT, ALBUMIN in the last 72 hours. No results for input(s): LIPASE, AMYLASE in the last 72 hours. Cardiac Enzymes Recent Labs    01/16/17 1247 01/16/17 1848 01/17/17 0024  TROPONINI <0.03 <0.03 <0.03   BNP Invalid input(s): POCBNP D-Dimer No results for input(s): DDIMER in the last 72 hours. Hemoglobin A1C No results for input(s): HGBA1C in the last 72 hours. Fasting Lipid Panel No results for input(s): CHOL, HDL, LDLCALC, TRIG, CHOLHDL, LDLDIRECT in the last 72 hours. Thyroid Function Tests No  results for input(s): TSH, T4TOTAL, T3FREE, THYROIDAB in the last 72 hours.  Invalid input(s): FREET3 _____________  Dg Chest 2 View  Result Date: 01/16/2017 CLINICAL DATA:  61 year old female with right arm pain for 2 weeks and intermittent chest and left arm pain for 2 days. EXAM: CHEST  2 VIEW COMPARISON:  10/12/2013 FINDINGS: Median sternotomy wires again noted. The heart size and mediastinal contours are within normal limits. No focal parenchymal consolidation, pleural effusions or pneumothorax. The visualized skeletal structures are unremarkable. IMPRESSION: No active cardiopulmonary disease. Electronically Signed   By: [name] M.D.   On: 01/16/2017 13:20     Diagnostic Studies/Procedures    Left heart cath  01/17/17:  Codominant coronary anatomy.  Normal left main.  20-25% ostial/proximal left anterior descending.  LAD is large and wraps the left ventricular apex.  No significant obstruction is seen.  Large circumflex without evidence of significant obstructive disease.  Codominant right coronary with no evidence of obstruction.  Normal left ventricular function with EF 55%.  LVEDP 12 mmHg  Occluded proximal LIMA to LAD identified by retrograde flow of LIMA from LAD to proximal segment where total occlusion exists.  Angiography is unchanged from study done in 2015.  RECOMMENDATIONS:   Stable coronary anatomy compared to 2015.  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  LAD is widely patent with ostial irregularity.  Chest pain is likely musculoskeletal/GI/other.  Diagnostic Diagram          Disposition   Pt is being discharged home today in good condition.  Follow-up Plans & Appointments    Office will call with appt. Discharge Instructions    Diet - low sodium heart healthy   Complete by:  As directed    Increase activity slowly   Complete by:  As directed       Discharge Medications   Allergies as of 01/18/2017      Reactions   Simvastatin Other (See Comments)   REACTION: constipation      Medication List    STOP taking these medications   aspirin 325 MG tablet Replaced by:  aspirin 81 MG chewable tablet   celecoxib 200 MG capsule Commonly known as:  CELEBREX   diclofenac sodium 1 % Gel Commonly known as:  VOLTAREN     TAKE these medications   amLODipine 5 MG tablet Commonly known as:  NORVASC Take 1 tablet (5 mg total) by mouth daily. Start taking on:  01/19/2017   aspirin 81 MG chewable tablet Chew 1 tablet (81 mg total) by mouth daily. Start taking on:  01/19/2017 Replaces:  aspirin 325 MG tablet   multivitamin with minerals tablet Take 1 tablet by mouth daily.   nitroGLYCERIN 0.4 MG SL tablet Commonly known as:   NITROSTAT Place 1 tablet (0.4 mg total) under the tongue every 5 (five) minutes x 3 doses as needed for chest pain.   rosuvastatin 20 MG tablet Commonly known as:  CRESTOR Take 1 tablet (20 mg total) by mouth daily.   triamcinolone cream 0.5 % Commonly known as:  KENALOG Apply 1 application topically 2 (two) times daily. To fingers as needed for dry skin What changed:    when to take this  reasons to take this  additional instructions        Outstanding Labs/Studies   Follow up in clinic 2-3 weeks, staff message sent to [facility]  Duration of Discharge Encounter   Greater than 30 minutes including physician time.  Signed, Tami Lin Rexanna Louthan  PA-C 01/18/2017, 12:58 PM

## 2017-01-20 ENCOUNTER — Telehealth: Payer: Self-pay | Admitting: Cardiology

## 2017-01-20 ENCOUNTER — Encounter (HOSPITAL_COMMUNITY): Payer: Self-pay | Admitting: Interventional Cardiology

## 2017-01-20 NOTE — Telephone Encounter (Signed)
Called patient and LVM to call back to schedule her hospital followup.

## 2017-01-22 ENCOUNTER — Ambulatory Visit: Payer: Managed Care, Other (non HMO) | Admitting: Student

## 2017-01-30 ENCOUNTER — Encounter: Payer: Self-pay | Admitting: *Deleted

## 2017-01-30 NOTE — Progress Notes (Signed)
Pt in ED lobby requesting copy of return to work note.  EDCM copied note from 01/18/17.

## 2017-02-10 DIAGNOSIS — N920 Excessive and frequent menstruation with regular cycle: Secondary | ICD-10-CM | POA: Insufficient documentation

## 2017-02-13 ENCOUNTER — Ambulatory Visit: Payer: Managed Care, Other (non HMO) | Admitting: Physician Assistant

## 2017-02-14 ENCOUNTER — Other Ambulatory Visit: Payer: Self-pay | Admitting: Physician Assistant

## 2017-02-14 DIAGNOSIS — M25562 Pain in left knee: Secondary | ICD-10-CM

## 2017-02-15 NOTE — Telephone Encounter (Signed)
Celebrex and NSAIDS are no longer recommended for this patient, due to her cardiac condition, as it makes the aspirin ineffective at preventing a heart attack or stroke.  She can use acetaminophen as needed for joint pain. If that is ineffective, she should come in here or see her orthopedic specialist to discuss alternative treatment for her arthritis.

## 2017-02-23 NOTE — Telephone Encounter (Signed)
Casey Huerta,  Did you communicate with the patient why we denied this refill! If you did, please document it. If you didn't, please contact her to explain.

## 2017-02-25 NOTE — Telephone Encounter (Signed)
LM to call back.

## 2017-03-25 ENCOUNTER — Other Ambulatory Visit: Payer: Self-pay

## 2017-03-25 ENCOUNTER — Encounter: Payer: Self-pay | Admitting: Physician Assistant

## 2017-03-25 ENCOUNTER — Ambulatory Visit (INDEPENDENT_AMBULATORY_CARE_PROVIDER_SITE_OTHER): Payer: Managed Care, Other (non HMO) | Admitting: Physician Assistant

## 2017-03-25 VITALS — BP 122/60 | HR 94 | Temp 98.6°F | Resp 18 | Ht 62.21 in | Wt 143.4 lb

## 2017-03-25 DIAGNOSIS — M25531 Pain in right wrist: Secondary | ICD-10-CM

## 2017-03-25 DIAGNOSIS — F430 Acute stress reaction: Secondary | ICD-10-CM

## 2017-03-25 DIAGNOSIS — M170 Bilateral primary osteoarthritis of knee: Secondary | ICD-10-CM | POA: Diagnosis not present

## 2017-03-25 DIAGNOSIS — R05 Cough: Secondary | ICD-10-CM

## 2017-03-25 DIAGNOSIS — I10 Essential (primary) hypertension: Secondary | ICD-10-CM

## 2017-03-25 DIAGNOSIS — Z01419 Encounter for gynecological examination (general) (routine) without abnormal findings: Secondary | ICD-10-CM | POA: Diagnosis not present

## 2017-03-25 DIAGNOSIS — E7800 Pure hypercholesterolemia, unspecified: Secondary | ICD-10-CM

## 2017-03-25 DIAGNOSIS — E785 Hyperlipidemia, unspecified: Secondary | ICD-10-CM

## 2017-03-25 DIAGNOSIS — Z1329 Encounter for screening for other suspected endocrine disorder: Secondary | ICD-10-CM

## 2017-03-25 DIAGNOSIS — Z0000 Encounter for general adult medical examination without abnormal findings: Secondary | ICD-10-CM | POA: Diagnosis not present

## 2017-03-25 DIAGNOSIS — M25532 Pain in left wrist: Secondary | ICD-10-CM

## 2017-03-25 DIAGNOSIS — I351 Nonrheumatic aortic (valve) insufficiency: Secondary | ICD-10-CM

## 2017-03-25 DIAGNOSIS — Z1211 Encounter for screening for malignant neoplasm of colon: Secondary | ICD-10-CM | POA: Diagnosis not present

## 2017-03-25 DIAGNOSIS — Z130 Encounter for screening for diseases of the blood and blood-forming organs and certain disorders involving the immune mechanism: Secondary | ICD-10-CM

## 2017-03-25 DIAGNOSIS — R059 Cough, unspecified: Secondary | ICD-10-CM

## 2017-03-25 DIAGNOSIS — Z131 Encounter for screening for diabetes mellitus: Secondary | ICD-10-CM

## 2017-03-25 MED ORDER — FLUTICASONE PROPIONATE 50 MCG/ACT NA SUSP: 2.0000 | Freq: Every day | NASAL | 1 refills | Status: DC

## 2017-03-25 MED ORDER — ROSUVASTATIN CALCIUM 20 MG PO TABS: 20.0000 mg | ORAL_TABLET | Freq: Every day | ORAL | 3 refills | Status: DC

## 2017-03-25 MED ORDER — ALPRAZOLAM 0.25 MG PO TABS: 0.2500 mg | ORAL_TABLET | Freq: Every evening | ORAL | 0 refills | Status: DC | PRN

## 2017-03-25 MED ORDER — CELECOXIB 200 MG PO CAPS: 200.0000 mg | ORAL_CAPSULE | Freq: Every day | ORAL | 0 refills | Status: DC

## 2017-03-25 NOTE — Patient Instructions (Addendum)
Please get a short wrist splint to help decrease the inflammation in your wrist.  You can get these from a pharmacy or from a medical supply store.  It needs to have something hard on the inside of your wrist and it is should velcro to get it to size of your wrist.  I think this is going to help with that I suspect is carpal tunnel.   IF you received an x-ray today, you will receive an invoice from [facility]. Please contact [facility] at [phone] with questions or concerns regarding your invoice.   IF you received labwork today, you will receive an invoice from [facility]. Please contact [facility] at [phone] with questions or concerns regarding your invoice.   Our billing staff will not be able to assist you with questions regarding bills from these companies.  You will be contacted with the lab results as soon as they are available. The fastest way to get your results is to activate your My Chart account. Instructions are located on the last page of this paperwork. If you have not heard from [place] regarding the results in 2 weeks, please contact this office.     Carpal Tunnel Syndrome Carpal tunnel syndrome is a condition that causes pain in your hand and arm. The carpal tunnel is a narrow area that is on the palm side of your wrist. Repeated wrist motion or certain diseases may cause swelling in the tunnel. This swelling can pinch the main nerve in the wrist (median nerve). Follow these instructions at home: If you have a splint:  Wear it as told by your doctor. Remove it only as told by your doctor.  Loosen the splint if your fingers: ? Become numb and tingle. ? Turn blue and cold.  Keep the splint clean and dry. General instructions  Take over-the-counter and prescription medicines only as told by your doctor.  Rest your wrist from any activity that may be causing your pain. If needed, talk to your employer about changes that can be made in your work, such as  getting a wrist pad to use while typing.  If directed, apply ice to the painful area: ? Put ice in a plastic bag. ? Place a towel between your skin and the bag. ? Leave the ice on for 20 minutes, 2-3 times per day.  Keep all follow-up visits as told by your doctor. This is important.  Do any exercises as told by your doctor, physical therapist, or occupational therapist. Contact a doctor if:  You have new symptoms.  Medicine does not help your pain.  Your symptoms get worse. This information is not intended to replace advice given to you by your health care provider. Make sure you discuss any questions you have with your health care provider. Document Released: 01/24/2011 Document Revised: 07/13/2015 Document Reviewed: 06/22/2014 Elsevier Interactive Patient Education  [employer].

## 2017-03-25 NOTE — Progress Notes (Signed)
Casey Huerta  MRN: [id number] DOB: 1955/04/22  PCP: Mancel Bale, PA-C  Subjective:  Pt presents to clinic for a CPE.  Aches in her arms, hands and wrists - worse on the right - worse on the days that she works.  She does have numbness, tingling sensation in her hands and wrist.  She does not have weakness.  No neck or shoulder pain.  The pain seems to be in the muscles.    Cough worse at night for the last 6 months.  Husband is aware of the cough at night.  She has used Vicks or Nyquil but it does not always work. She does not feel sick like a cold and does not feel sick.  She has never had problems with allergies.  She has no problems with heartburn or indigestion.  She has had problems at night with getting upset and "hyper" she has used a couple of her husbands Xanax and they help her a lot to be able to calm down and sleep.  She would use them about 2x/month if she had her own.  Her mood is good - she has no feelings of depression or anxiety.  Last dental exam: she has dentures - she does not see them Last vision exam: wears glasses - last vision exam within the last year Last pap: last year - neg pap with HPV positive - will repeat today Last mammo: 04/2017 Last colonoscopy:  Years ago Vaccinations  Typical meals for patient: snack all day - eat home cooked meal at dinner 3x/week and other nights leftovers Typical beverage choices: water Exercises: none - walk at work Sleeps: 8 hrs per night and sleeping well   Patient Active Problem List   Diagnosis Date Noted  . MENORRHAGIA   . Chest pain with moderate risk of acute coronary syndrome 01/16/2017  . Osteoarthritis of both knees - celebrex when she works - helps her knee pain 06/14/2016  . Elevated BP without diagnosis of hypertension 06/14/2016  . Breast mass - mammogram is due in 3/19 05/17/2016  . Aortic insufficiency - sees cardiologist 03/06/2012  . Dyslipidemia -  06/19/2007  . Essential hypertension -   06/19/2007  . Hx of CABG 06/19/2007    Review of Systems  Constitutional: Negative.   HENT: Negative.   Eyes: Negative.   Respiratory: Negative.   Cardiovascular: Negative.   Gastrointestinal: Negative.   Endocrine: Negative.   Genitourinary: Negative.   Musculoskeletal: Positive for myalgias (arms and wrist/hands). Negative for back pain, joint swelling and neck pain.  Skin: Negative.   Allergic/Immunologic: Negative.   Neurological: Negative.   Hematological: Negative.   Psychiatric/Behavioral: Negative.      Current Outpatient Medications on File Prior to Visit  Medication Sig Dispense Refill  . amLODipine (NORVASC) 5 MG tablet Take 1 tablet (5 mg total) by mouth daily. 90 tablet 4  . aspirin 81 MG chewable tablet Chew 1 tablet (81 mg total) by mouth daily. 90 tablet 4  . Multiple Vitamins-Minerals (MULTIVITAMIN WITH MINERALS) tablet Take 1 tablet by mouth daily.    . nitroGLYCERIN (NITROSTAT) 0.4 MG SL tablet Place 1 tablet (0.4 mg total) under the tongue every 5 (five) minutes x 3 doses as needed for chest pain. 25 tablet 1  . triamcinolone cream (KENALOG) 0.5 % Apply 1 application topically 2 (two) times daily. To fingers as needed for dry skin (Patient taking differently: Apply 1 application topically 2 (two) times daily as needed (for dry skin). )  45 g 0   No current facility-administered medications on file prior to visit.     Allergies  Allergen Reactions  . Simvastatin Other (See Comments)    REACTION: constipation    Social History   Socioeconomic History  . Marital status: Married    Spouse name: None  . Number of children: None  . Years of education: None  . Highest education level: None  Social Needs  . Financial resource strain: None  . Food insecurity - worry: None  . Food insecurity - inability: None  . Transportation needs - medical: None  . Transportation needs - non-medical: None  Occupational History  . None  Tobacco Use  . Smoking status:  Former Smoker    Last attempt to quit: 1980    Years since quitting: 39.1  . Smokeless tobacco: Never Used  . Tobacco comment: QUIT SMOKING IN 1999  Substance and Sexual Activity  . Alcohol use: Yes    Comment: friday nights - 2 glasses - rare  . Drug use: No    Comment:  quit using in 1985  . Sexual activity: Yes    Partners: Male  Other Topics Concern  . None  Social History Narrative   Marital status: married   Children: 3 chidlren and 16 grandchildren and 1 great grandchild   Lives with: husband   Employment: customer service [name]   Tobacco:  quit   Alcohol:  Friday nights   Drugs:  none   Exercise:  no   Seatbelt: 100%   Guns in home: rifle       Secured: yes    Past Surgical History:  Procedure Laterality Date  . arthroscopic knee surgery    . CESAREAN SECTION    . CORONARY ARTERY BYPASS GRAFT    . LEFT HEART CATH AND CORONARY ANGIOGRAPHY N/A 01/17/2017   Procedure: LEFT HEART CATH AND CORONARY ANGIOGRAPHY;  Surgeon: Belva Crome, MD;  Location: [facility];  Service: Cardiovascular;  Laterality: N/A;  . LEFT HEART CATHETERIZATION WITH CORONARY/GRAFT ANGIOGRAM N/A 10/12/2013   Procedure: LEFT HEART CATHETERIZATION WITH Beatrix Fetters;  Surgeon: Troy Sine, MD;  Location: [facility];  Service: Cardiovascular;  Laterality: N/A;    Family History  Problem Relation Age of Onset  . Heart disease Mother   . Heart disease Sister   . Cancer Sister      Objective:  BP 122/60   Pulse 94   Temp 98.6 F (37 C) (Oral)   Resp 18   Ht 5' 2.21" (1.58 m)   Wt 143 lb 6.4 oz (65 kg)   SpO2 98%   BMI 26.06 kg/m   Physical Exam  Constitutional: She is oriented to person, place, and time and well-developed, well-nourished, and in no distress.  HENT:  Head: Normocephalic and atraumatic.  Right Ear: Hearing, tympanic membrane, external ear and ear canal normal.  Left Ear: Hearing, tympanic membrane, external ear and ear canal normal.    Nose: Nose normal.  Mouth/Throat: Uvula is midline, oropharynx is clear and moist and mucous membranes are normal.  Eyes: Conjunctivae and EOM are normal. Pupils are equal, round, and reactive to light.  Neck: Trachea normal and normal range of motion. Neck supple. No thyroid mass and no thyromegaly present.  Cardiovascular: Normal rate, regular rhythm and normal heart sounds.  No murmur heard. Pulmonary/Chest: Effort normal and breath sounds normal. She has no wheezes. Right breast exhibits mass (1cm non-tender mass at 11/12 oclock -  same mass that was palpated last year). Right breast exhibits no inverted nipple, no nipple discharge, no skin change and no tenderness. Left breast exhibits no inverted nipple, no mass, no nipple discharge, no skin change and no tenderness. Breasts are symmetrical.    Abdominal: Soft. Bowel sounds are normal. There is no tenderness.  Musculoskeletal: Normal range of motion.  Bilateral elbows - FROM without pain - no exam results consistent with lateral or medial epicondylitis  Bilateral wrist - neg phalens but positive Tinels - pt experience sharp intense pain going into thumb, 2nd, 3rd digit, no weakness with hand grip  Lymphadenopathy:    She has no cervical adenopathy.  Neurological: She is alert and oriented to person, place, and time. She has normal motor skills, normal sensation, normal strength and normal reflexes. Gait normal.  Skin: Skin is warm and dry.  Psychiatric: Mood, memory, affect and judgment normal.    Wt Readings from Last 3 Encounters:  03/25/17 143 lb 6.4 oz (65 kg)  01/18/17 139 lb 4.8 oz (63.2 kg)  01/16/17 143 lb (64.9 kg)     Visual Acuity Screening   Right eye Left eye Both eyes  Without correction:     With correction: 20/20-1 20/20-2 20/20-1    Assessment and Plan :  Annual physical exam  Aortic valve insufficiency, etiology of cardiac valve disease unspecified  Essential hypertension - Plan: CMP14+EGFR - continue  Norvasc  Osteoarthritis of both knees, unspecified osteoarthritis type - Plan: celecoxib (CELEBREX) 200 MG capsule  Dyslipidemia - Plan: Lipid panel - pt had stopped her crestor but I can find nothing in her cardiology notes to support this change - I have refilled this medication - expect her numbers to perhaps be high as she has been off this medication for several months.  Encounter for gynecological examination without abnormal finding - Plan: Pap IG and HPV (high risk) DNA detection - check pap again due to last year neg pap with HPV high risk present  Bilateral wrist pain - Plan: celecoxib (CELEBREX) 200 MG capsule - suspect carpal tunnel - pt did remember that she has had to wear wrist braces in the past for similar problem - she will start with 1 brace and then if that wrist feels better she will get one for the other wrist.  If no help she will consider nerve conduction studies  Screen for colon cancer - Plan: Cologuard  Screening for thyroid disorder - Plan: TSH  Screening for diabetes mellitus - Plan: Hemoglobin A1c  Screening for deficiency anemia - Plan: CBC with Differential/Platelet  Pure hypercholesterolemia - Plan: rosuvastatin (CRESTOR) 20 MG tablet  Cough - Plan: fluticasone (FLONASE) 50 MCG/ACT nasal spray - trial of nose spray to treat possible allergies as the patient has no problems with heartburn but if this does not help we will consider zantac to treat silent heartburn  Stress reaction - Plan: ALPRAZolam (XANAX) 0.25 MG tablet - pt has trouble sleeping 2/month at night due to stress - she was given this as she has used it in the past with success - expect this Rx to last almost a year.  Windell Hummingbird PA-C  [facility] 03/25/2017 5:59 PM

## 2017-03-26 LAB — HEMOGLOBIN A1C
Est. average glucose Bld gHb Est-mCnc: 123 mg/dL
Hgb A1c MFr Bld: 5.9 % — ABNORMAL HIGH (ref 4.8–5.6)

## 2017-03-26 LAB — PAP IG AND HPV HIGH-RISK
HPV, HIGH-RISK: NEGATIVE
PAP SMEAR COMMENT: 0

## 2017-03-26 LAB — CBC WITH DIFFERENTIAL/PLATELET
BASOS ABS: 0 10*3/uL (ref 0.0–0.2)
Basos: 1 %
EOS (ABSOLUTE): 0.1 10*3/uL (ref 0.0–0.4)
Eos: 3 %
HEMATOCRIT: 40.2 % (ref 34.0–46.6)
HEMOGLOBIN: 13.5 g/dL (ref 11.1–15.9)
Immature Grans (Abs): 0 10*3/uL (ref 0.0–0.1)
Immature Granulocytes: 0 %
LYMPHS ABS: 2.4 10*3/uL (ref 0.7–3.1)
Lymphs: 46 %
MCH: 29.3 pg (ref 26.6–33.0)
MCHC: 33.6 g/dL (ref 31.5–35.7)
MCV: 87 fL (ref 79–97)
MONOCYTES: 10 %
MONOS ABS: 0.5 10*3/uL (ref 0.1–0.9)
NEUTROS ABS: 2 10*3/uL (ref 1.4–7.0)
Neutrophils: 40 %
Platelets: 272 10*3/uL (ref 150–379)
RBC: 4.61 x10E6/uL (ref 3.77–5.28)
RDW: 12.9 % (ref 12.3–15.4)
WBC: 5.1 10*3/uL (ref 3.4–10.8)

## 2017-03-26 LAB — CMP14+EGFR
A/G RATIO: 1.4 (ref 1.2–2.2)
ALBUMIN: 4.4 g/dL (ref 3.6–4.8)
ALK PHOS: 93 IU/L (ref 39–117)
ALT: 19 IU/L (ref 0–32)
AST: 21 IU/L (ref 0–40)
BUN / CREAT RATIO: 11 — AB (ref 12–28)
BUN: 12 mg/dL (ref 8–27)
Bilirubin Total: <0.2 mg/dL (ref 0.0–1.2)
CO2: 23 mmol/L (ref 20–29)
Calcium: 10 mg/dL (ref 8.7–10.3)
Chloride: 104 mmol/L (ref 96–106)
Creatinine, Ser: 1.13 mg/dL — ABNORMAL HIGH (ref 0.57–1.00)
GFR calc non Af Amer: 53 mL/min/{1.73_m2} — ABNORMAL LOW (ref >59)
GFR, EST AFRICAN AMERICAN: 61 mL/min/{1.73_m2} (ref >59)
GLOBULIN, TOTAL: 3.1 g/dL (ref 1.5–4.5)
GLUCOSE: 103 mg/dL — AB (ref 65–99)
POTASSIUM: 4.2 mmol/L (ref 3.5–5.2)
SODIUM: 141 mmol/L (ref 134–144)
TOTAL PROTEIN: 7.5 g/dL (ref 6.0–8.5)

## 2017-03-26 LAB — LIPID PANEL
CHOL/HDL RATIO: 3.9 ratio (ref 0.0–4.4)
Cholesterol, Total: 213 mg/dL — ABNORMAL HIGH (ref 100–199)
HDL: 55 mg/dL (ref >39)
LDL Calculated: 105 mg/dL — ABNORMAL HIGH (ref 0–99)
Triglycerides: 263 mg/dL — ABNORMAL HIGH (ref 0–149)
VLDL Cholesterol Cal: 53 mg/dL — ABNORMAL HIGH (ref 5–40)

## 2017-03-26 LAB — TSH: TSH: 1.53 u[IU]/mL (ref 0.450–4.500)

## 2017-04-09 LAB — COLOGUARD: Cologuard: NEGATIVE

## 2017-04-16 ENCOUNTER — Other Ambulatory Visit: Payer: Self-pay | Admitting: Physician Assistant

## 2017-04-16 DIAGNOSIS — F430 Acute stress reaction: Secondary | ICD-10-CM

## 2017-05-06 ENCOUNTER — Other Ambulatory Visit: Payer: Self-pay

## 2017-05-06 ENCOUNTER — Ambulatory Visit: Payer: Managed Care, Other (non HMO) | Admitting: Physician Assistant

## 2017-05-06 ENCOUNTER — Encounter: Payer: Self-pay | Admitting: Physician Assistant

## 2017-05-06 VITALS — BP 132/60 | HR 85 | Temp 98.6°F | Resp 18 | Ht 62.21 in | Wt 142.0 lb

## 2017-05-06 DIAGNOSIS — G4700 Insomnia, unspecified: Secondary | ICD-10-CM

## 2017-05-06 DIAGNOSIS — I10 Essential (primary) hypertension: Secondary | ICD-10-CM

## 2017-05-06 DIAGNOSIS — F430 Acute stress reaction: Secondary | ICD-10-CM | POA: Diagnosis not present

## 2017-05-06 DIAGNOSIS — R059 Cough, unspecified: Secondary | ICD-10-CM

## 2017-05-06 DIAGNOSIS — R05 Cough: Secondary | ICD-10-CM | POA: Diagnosis not present

## 2017-05-06 MED ORDER — ALPRAZOLAM 0.25 MG PO TABS: 0.2500 mg | ORAL_TABLET | Freq: Every day | ORAL | 0 refills | Status: DC | PRN

## 2017-05-06 MED ORDER — AMLODIPINE BESYLATE 5 MG PO TABS: 5.0000 mg | ORAL_TABLET | Freq: Every day | ORAL | 4 refills | Status: DC

## 2017-05-06 MED ORDER — FLUTICASONE PROPIONATE 50 MCG/ACT NA SUSP: 2.0000 | Freq: Every day | NASAL | 5 refills | Status: DC

## 2017-05-06 MED ORDER — TRAZODONE HCL 50 MG PO TABS
25.0000 mg | ORAL_TABLET | Freq: Every evening | ORAL | 0 refills | Status: DC | PRN
Start: 2017-05-06 — End: 2018-03-12

## 2017-05-06 NOTE — Progress Notes (Signed)
Casey Huerta  MRN: [id number] DOB: 1955-06-09  PCP: Mancel Bale, PA-C  Chief Complaint  Patient presents with  . Follow-up    Subjective:  Pt presents to clinic for f/u   Wrist pain - resolved - she wore braces for about a week - her pain is all gone -  Celebrex has really helped with her - she takes daily and is happy with the results - it helps her wrist pain when she had that but now it helps her with her bilateral knee OA.  Cough has resolved with flonase - wants to continue  She has had a few episodes at work (2) where she has not been as aware of things as she typically is. She has been taken out of her position for the next 30d to make sure this resolves. She has been using xanax at night to sleep and then sometimes during the day for stress.  She has used about 20 pills in 2 weeks.  She is not sure if these episodes are related to days when she has used the Xanax.  It does help her sleep and help calm her down.    History is obtained by patient.  Review of Systems  Psychiatric/Behavioral: Positive for sleep disturbance (trouble every night). The patient is nervous/anxious.     Patient Active Problem List   Diagnosis Date Noted  . Chest pain with moderate risk of acute coronary syndrome 01/16/2017  . Osteoarthritis of both knees 06/14/2016  . Breast mass 05/17/2016  . Aortic insufficiency 03/06/2012  . Dyslipidemia 06/19/2007  . Essential hypertension 06/19/2007  . Hx of CABG 06/19/2007    Current Outpatient Medications on File Prior to Visit  Medication Sig Dispense Refill  . aspirin 81 MG chewable tablet Chew 1 tablet (81 mg total) by mouth daily. 90 tablet 4  . celecoxib (CELEBREX) 200 MG capsule Take 1 capsule (200 mg total) by mouth daily. 90 capsule 0  . Multiple Vitamins-Minerals (MULTIVITAMIN WITH MINERALS) tablet Take 1 tablet by mouth daily.    . nitroGLYCERIN (NITROSTAT) 0.4 MG SL tablet Place 1 tablet (0.4 mg total) under the tongue every 5  (five) minutes x 3 doses as needed for chest pain. 25 tablet 1  . rosuvastatin (CRESTOR) 20 MG tablet Take 1 tablet (20 mg total) by mouth daily. 90 tablet 3  . triamcinolone cream (KENALOG) 0.5 % Apply 1 application topically 2 (two) times daily. To fingers as needed for dry skin (Patient taking differently: Apply 1 application topically 2 (two) times daily as needed (for dry skin). ) 45 g 0   No current facility-administered medications on file prior to visit.     Allergies  Allergen Reactions  . Simvastatin Other (See Comments)    REACTION: constipation    Past Medical History:  Diagnosis Date  . Aortic insufficiency    a. Mild-mod AI by echo 2010.  . Breast mass 05/17/2016   dr feels lump inner right quad pt cannot feel lump  . COPD   . CORONARY ARTERY DISEASE    a. CABG (LIMA-->LAD 2001). b. Myoview 2008 no ischemia, EF 63%.  c. cath 10/14/2013 mild 20% ost LAD, atretic LIMA, EF 55%, medical therapy  . HYPERLIPIDEMIA   . HYPERTENSION   . MENORRHAGIA    Social History   Social History Narrative   Marital status: married   Children: 3 chidlren and 16 grandchildren and 1 great grandchild   Lives with: husband   Employment: Therapist, art  [name]   Tobacco:  quit   Alcohol:  Friday nights   Drugs:  none   Exercise:  no   Seatbelt: 100%   Guns in home: rifle       Secured: yes   Social History   Tobacco Use  . Smoking status: Former Smoker    Last attempt to quit: 1980    Years since quitting: 39.2  . Smokeless tobacco: Never Used  . Tobacco comment: QUIT SMOKING IN 1999  Substance Use Topics  . Alcohol use: Yes    Comment: friday nights - 2 glasses - rare  . Drug use: No    Comment:  quit using in 1985   family history includes Cancer in her sister; Heart disease in her mother and sister.     Objective:  BP 132/60   Pulse 85   Temp 98.6 F (37 C) (Oral)   Resp 18   Ht 5' 2.21" (1.58 m)   Wt 142 lb (64.4 kg)   SpO2 97%   BMI 25.80 kg/m  Body  mass index is 25.8 kg/m.  Physical Exam  Constitutional: She is oriented to person, place, and time and well-developed, well-nourished, and in no distress.  HENT:  Head: Normocephalic and atraumatic.  Right Ear: Hearing and external ear normal.  Left Ear: Hearing and external ear normal.  Eyes: Conjunctivae are normal.  Neck: Normal range of motion.  Cardiovascular: Normal rate, regular rhythm and normal heart sounds.  No murmur heard. Pulmonary/Chest: Effort normal and breath sounds normal. She has no wheezes.  Neurological: She is alert and oriented to person, place, and time. Gait normal.  Skin: Skin is warm and dry.  Psychiatric: Mood, memory, affect and judgment normal.  Vitals reviewed.   Assessment and Plan :  Insomnia, unspecified type - Plan: traZODone (DESYREL) 50 MG tablet concerned that her is having daytime symptoms from her xanax that caused work problems as a result of memory changes and decreased focus. We will start trazodone - instructions on use were given to patient.  Cough - Plan: fluticasone (FLONASE) 50 MCG/ACT nasal spray - this is likely allergies - continue FLonase  Stress reaction - Plan: ALPRAZolam (XANAX) 0.25 MG tablet- use prn 10 pills should last at least a month - use sparingly - we did talk about that if she needed more frequently would likely need a daily medication to help with her anxiety.  Essential hypertension - Plan: amLODipine (NORVASC) 5 MG tablet - controlled  Windell Hummingbird PA-C  [facility] 05/06/2017 6:46 PM

## 2017-05-06 NOTE — Patient Instructions (Addendum)
  To start the Trazodone - start with 1/2 pill at night for the 1st 4 nights - increase the dose by 25mg  (1/2 pill)  until either you sleep well, have side effects or reach a nightly dose of 75 mg.  Use the Xanax only when your stress levels get really high -if your stress continues to stay high we will want to think about a daily medication to help with your general stress level    IF you received an x-ray today, you will receive an invoice from [facility]. Please contact [facility] at [phone] with questions or concerns regarding your invoice.   IF you received labwork today, you will receive an invoice from [facility]. Please contact [facility] at [phone] with questions or concerns regarding your invoice.   Our billing staff will not be able to assist you with questions regarding bills from these companies.  You will be contacted with the lab results as soon as they are available. The fastest way to get your results is to activate your My Chart account. Instructions are located on the last page of this paperwork. If you have not heard from [place] regarding the results in 2 weeks, please contact this office.

## 2017-05-13 ENCOUNTER — Encounter: Payer: Self-pay | Admitting: Physician Assistant

## 2017-05-28 ENCOUNTER — Other Ambulatory Visit: Payer: Self-pay | Admitting: Physician Assistant

## 2017-05-28 DIAGNOSIS — F430 Acute stress reaction: Secondary | ICD-10-CM

## 2017-05-29 NOTE — Telephone Encounter (Signed)
Request for alprazolam 0.25 mg tab  Last refill 04/18/17  Provider  S. Weber, [place] [phone]

## 2017-05-30 NOTE — Telephone Encounter (Signed)
Done

## 2017-06-03 ENCOUNTER — Ambulatory Visit: Payer: Managed Care, Other (non HMO) | Admitting: Physician Assistant

## 2017-06-03 ENCOUNTER — Telehealth: Payer: Self-pay | Admitting: Physician Assistant

## 2017-06-03 NOTE — Telephone Encounter (Signed)
mychart message sent to pt about rescheduling her 5/31 apt with [employer] [facility]

## 2017-06-09 ENCOUNTER — Other Ambulatory Visit: Payer: Self-pay | Admitting: Physician Assistant

## 2017-06-09 DIAGNOSIS — M25562 Pain in left knee: Secondary | ICD-10-CM

## 2017-06-17 ENCOUNTER — Telehealth: Payer: Self-pay | Admitting: Physician Assistant

## 2017-06-17 NOTE — Telephone Encounter (Signed)
Pt came in today 4.30.19 to drop off FMLA forms. I gave her a copy and filled out Medical Release and placed in Caitlin's box. She was hoping to have them done today and I explained that it could be from 5-7 days.

## 2017-06-18 NOTE — Telephone Encounter (Signed)
Pt needs FMLA forms completed by Judson Roch for her hospital stay in 12/2016 for her employer. I have completed the forms based off the OV notes. I will place the forms in Sarah's box on 06/18/17 please return to the FMLA/Disability desk within 5-7 business days. Thank you!

## 2017-06-19 NOTE — Telephone Encounter (Signed)
Done

## 2017-06-20 NOTE — Telephone Encounter (Signed)
That is fine forms are ready for her at 54

## 2017-06-20 NOTE — Telephone Encounter (Addendum)
°  Relation to pt: self  Call back number:[phone]   Reason for call:  Patient returned call stating she would like her spouse Regionald Bowlds to pick up FMLA today.

## 2017-07-05 ENCOUNTER — Other Ambulatory Visit: Payer: Self-pay | Admitting: Physician Assistant

## 2017-07-05 DIAGNOSIS — M170 Bilateral primary osteoarthritis of knee: Secondary | ICD-10-CM

## 2017-07-05 DIAGNOSIS — M25532 Pain in left wrist: Secondary | ICD-10-CM

## 2017-07-05 DIAGNOSIS — M25531 Pain in right wrist: Secondary | ICD-10-CM

## 2017-07-18 ENCOUNTER — Ambulatory Visit: Payer: Managed Care, Other (non HMO) | Admitting: Physician Assistant

## 2017-07-20 ENCOUNTER — Other Ambulatory Visit: Payer: Self-pay | Admitting: Physician Assistant

## 2017-07-20 DIAGNOSIS — M25531 Pain in right wrist: Secondary | ICD-10-CM

## 2017-07-20 DIAGNOSIS — F430 Acute stress reaction: Secondary | ICD-10-CM

## 2017-07-20 DIAGNOSIS — M25532 Pain in left wrist: Secondary | ICD-10-CM

## 2017-07-20 DIAGNOSIS — M170 Bilateral primary osteoarthritis of knee: Secondary | ICD-10-CM

## 2017-07-22 NOTE — Telephone Encounter (Signed)
Patient is requesting a refill of the following medications: Requested Prescriptions   Pending Prescriptions Disp Refills  . ALPRAZolam (XANAX) 0.25 MG tablet 10 tablet 0  . celecoxib (CELEBREX) 200 MG capsule 90 capsule 0    Sig: Take 1 capsule (200 mg total) by mouth daily.    Date of patient request: 07/21/2017 Last office visit: 05/06/2017 Date of last refill: 05/30/2017 Last refill amount: 10 Follow up time period per chart:

## 2017-07-23 MED ORDER — CELECOXIB 200 MG PO CAPS: 200.0000 mg | ORAL_CAPSULE | Freq: Every day | ORAL | 0 refills | Status: DC

## 2017-07-23 MED ORDER — ALPRAZOLAM 0.25 MG PO TABS: 0.2500 mg | ORAL_TABLET | Freq: Every evening | ORAL | 0 refills | Status: DC | PRN

## 2017-08-19 ENCOUNTER — Other Ambulatory Visit: Payer: Self-pay | Admitting: Physician Assistant

## 2017-08-19 DIAGNOSIS — F430 Acute stress reaction: Secondary | ICD-10-CM

## 2017-08-19 NOTE — Telephone Encounter (Signed)
Xanax refill Last Refill:07/23/17 #10 Last OV: 07/23/17 PCP: Windell Hummingbird, PA

## 2017-09-09 ENCOUNTER — Other Ambulatory Visit: Payer: Self-pay | Admitting: Physician Assistant

## 2017-09-09 DIAGNOSIS — F430 Acute stress reaction: Secondary | ICD-10-CM

## 2017-09-10 NOTE — Telephone Encounter (Signed)
Please advise 

## 2017-09-10 NOTE — Telephone Encounter (Signed)
Request refilled for controlled med: xanax 0.25 mg Tab  Last refill was on 08/20/17 for 10 tabs  LOV  05/06/17  S. Weber

## 2017-09-11 NOTE — Telephone Encounter (Signed)
Patient is requesting a refill of the following medications: Requested Prescriptions   Pending Prescriptions Disp Refills  . ALPRAZolam (XANAX) 0.25 MG tablet [Pharmacy Med Name: ALPRAZolam 0.25 MG TABLET] 10 tablet 0    Sig: TAKE ONE TABLET BY MOUTH AT BEDTIME AS NEEDED FOR ANXIETY    Date of patient request: 09/09/2017 Last office visit: 05/06/2017 Date of last refill: 08/20/2017 Last refill amount: 10 Follow up time period per chart:

## 2017-09-30 ENCOUNTER — Other Ambulatory Visit: Payer: Self-pay | Admitting: Physician Assistant

## 2017-09-30 DIAGNOSIS — F430 Acute stress reaction: Secondary | ICD-10-CM

## 2017-09-30 NOTE — Telephone Encounter (Signed)
Refill of Xanax  LRF 09/11/17  #10  0 refills  LOV 05/06/17 S. Weber   [name] [facility] [address] [place]

## 2017-10-28 ENCOUNTER — Other Ambulatory Visit: Payer: Self-pay | Admitting: Physician Assistant

## 2017-10-28 DIAGNOSIS — F430 Acute stress reaction: Secondary | ICD-10-CM

## 2017-10-28 NOTE — Telephone Encounter (Signed)
Patient is requesting a refill of the following medications: Requested Prescriptions   Pending Prescriptions Disp Refills  . ALPRAZolam (XANAX) 0.25 MG tablet [Pharmacy Med Name: ALPRAZolam 0.25 MG TABLET] 10 tablet 0    Sig: TAKE ONE TABLET BY MOUTH AT BEDTIME AS NEEDED FOR ANXIETY    Date of patient request: 10/28/2017 Last office visit: 05/06/2017 Date of last refill: 10/03/2017 Last refill amount: 10 Follow up time period per chart:

## 2017-10-28 NOTE — Telephone Encounter (Signed)
alprazolam refill Last Refill:10/03/17 # 10 Last OV: 05/06/17 PCP: Windell Hummingbird PA-C [facility]

## 2017-11-20 ENCOUNTER — Other Ambulatory Visit: Payer: Self-pay | Admitting: Physician Assistant

## 2017-11-20 DIAGNOSIS — F430 Acute stress reaction: Secondary | ICD-10-CM

## 2017-11-26 NOTE — Telephone Encounter (Signed)
Patient is requesting a refill of the following medications: Requested Prescriptions   Pending Prescriptions Disp Refills  . ALPRAZolam (XANAX) 0.25 MG tablet [Pharmacy Med Name: ALPRAZolam 0.25 MG TABLET] 10 tablet 0    Sig: TAKE ONE TABLET BY MOUTH AT BEDTIME AS NEEDED FOR ANXIETY    Date of patient request: 11/20/2017 Last office visit: 05/06/17 Date of last refill: 11/02/17 Last refill amount: 10 Follow up time period per chart: return to office in 4 weeks (around 06/03/17) for med recheck.  Please advise.  06/03/2017 ov canceled by patient. Dgaddy, CMA

## 2017-11-30 NOTE — Telephone Encounter (Signed)
Chart reviewed Last visit in march with sarah weber Does not take daily, last visit concerns for side effects Needs to establish care and discuss this medication with new pcp

## 2017-11-30 NOTE — Telephone Encounter (Signed)
Please schedule patient to establish care as patient's previous pcp in longer with this practice. thanks 

## 2017-12-01 ENCOUNTER — Telehealth: Payer: Self-pay | Admitting: Family Medicine

## 2017-12-01 NOTE — Telephone Encounter (Signed)
LVM for pt to call the office and schedule an appt with a new provider. She will need to establish care with a new provider in order to get a refill of Xanax.  When pt calls in, please schedule her with a provider of her choice for an OV: Establish Care   Thank you!

## 2017-12-16 ENCOUNTER — Ambulatory Visit: Payer: Managed Care, Other (non HMO) | Admitting: Emergency Medicine

## 2017-12-16 ENCOUNTER — Encounter: Payer: Self-pay | Admitting: Emergency Medicine

## 2017-12-16 VITALS — BP 137/75 | HR 81 | Temp 98.6°F | Resp 17 | Ht 62.0 in | Wt 133.0 lb

## 2017-12-16 DIAGNOSIS — F430 Acute stress reaction: Secondary | ICD-10-CM

## 2017-12-16 DIAGNOSIS — B349 Viral infection, unspecified: Secondary | ICD-10-CM

## 2017-12-16 DIAGNOSIS — M791 Myalgia, unspecified site: Secondary | ICD-10-CM | POA: Diagnosis not present

## 2017-12-16 DIAGNOSIS — R6889 Other general symptoms and signs: Secondary | ICD-10-CM | POA: Diagnosis not present

## 2017-12-16 LAB — POC INFLUENZA A&B (BINAX/QUICKVUE)
INFLUENZA A, POC: NEGATIVE
INFLUENZA B, POC: NEGATIVE

## 2017-12-16 MED ORDER — ALPRAZOLAM 0.25 MG PO TABS: ORAL_TABLET | ORAL | 0 refills | Status: DC

## 2017-12-16 MED ORDER — HYDROCODONE-ACETAMINOPHEN 5-325 MG PO TABS: 1.0000 | ORAL_TABLET | Freq: Four times a day (QID) | ORAL | 0 refills | Status: DC | PRN

## 2017-12-16 NOTE — Patient Instructions (Addendum)
If you have lab work done today you will be contacted with your lab results within the next 2 weeks.  If you have not heard from [place] then please contact us. The fastest way to get your results is to register for My Chart.   IF you received an x-ray today, you will receive an invoice from [facility]. Please contact [facility] at [phone] with questions or concerns regarding your invoice.   IF you received labwork today, you will receive an invoice from [place]. Please contact [facility] at [phone] with questions or concerns regarding your invoice.   Our billing staff will not be able to assist you with questions regarding bills from these companies.  You will be contacted with the lab results as soon as they are available. The fastest way to get your results is to activate your My Chart account. Instructions are located on the last page of this paperwork. If you have not heard from [place] regarding the results in 2 weeks, please contact this office.      Viral Illness, Adult Viruses are tiny germs that can get into a person's body and cause illness. There are many different types of viruses, and they cause many types of illness. Viral illnesses can range from mild to severe. They can affect various parts of the body. Common illnesses that are caused by a virus include colds and the flu. Viral illnesses also include serious conditions such as HIV/AIDS (human immunodeficiency virus/acquired immunodeficiency syndrome). A few viruses have been linked to certain cancers. What are the causes? Many types of viruses can cause illness. Viruses invade cells in your body, multiply, and cause the infected cells to malfunction or die. When the cell dies, it releases more of the virus. When this happens, you develop symptoms of the illness, and the virus continues to spread to other cells. If the virus takes over the function of the cell, it can cause the cell to divide and grow out  of control, as is the case when a virus causes cancer. Different viruses get into the body in different ways. You can get a virus by:  Swallowing food or water that is contaminated with the virus.  Breathing in droplets that have been coughed or sneezed into the air by an infected person.  Touching a surface that has been contaminated with the virus and then touching your eyes, nose, or mouth.  Being bitten by an insect or animal that carries the virus.  Having sexual contact with a person who is infected with the virus.  Being exposed to blood or fluids that contain the virus, either through an open cut or during a transfusion.  If a virus enters your body, your body's defense system (immune system) will try to fight the virus. You may be at higher risk for a viral illness if your immune system is weak. What are the signs or symptoms? Symptoms vary depending on the type of virus and the location of the cells that it invades. Common symptoms of the main types of viral illnesses include: Cold and flu viruses  Fever.  Headache.  Sore throat.  Muscle aches.  Nasal congestion.  Cough. Digestive system (gastrointestinal) viruses  Fever.  Abdominal pain.  Nausea.  Diarrhea. Liver viruses (hepatitis)  Loss of appetite.  Tiredness.  Yellowing of the skin (jaundice). Brain and spinal cord viruses  Fever.  Headache.  Stiff neck.  Nausea and vomiting.  Confusion or sleepiness. Skin viruses  Warts.  Itching.  Rash. Sexually transmitted viruses  Discharge.  Swelling.  Redness.  Rash. How is this treated? Viruses can be difficult to treat because they live within cells. Antibiotic medicines do not treat viruses because these drugs do not get inside cells. Treatment for a viral illness may include:  Resting and drinking plenty of fluids.  Medicines to relieve symptoms. These can include over-the-counter medicine for pain and fever, medicines for cough  or congestion, and medicines to relieve diarrhea.  Antiviral medicines. These drugs are available only for certain types of viruses. They may help reduce flu symptoms if taken early. There are also many antiviral medicines for hepatitis and HIV/AIDS.  Some viral illnesses can be prevented with vaccinations. A common example is the flu shot. Follow these instructions at home: Medicines   Take over-the-counter and prescription medicines only as told by your health care provider.  If you were prescribed an antiviral medicine, take it as told by your health care provider. Do not stop taking the medicine even if you start to feel better.  Be aware of when antibiotics are needed and when they are not needed. Antibiotics do not treat viruses. If your health care provider thinks that you may have a bacterial infection as well as a viral infection, you may get an antibiotic. ? Do not ask for an antibiotic prescription if you have been diagnosed with a viral illness. That will not make your illness go away faster. ? Frequently taking antibiotics when they are not needed can lead to antibiotic resistance. When this develops, the medicine no longer works against the bacteria that it normally fights. General instructions  Drink enough fluids to keep your urine clear or pale yellow.  Rest as much as possible.  Return to your normal activities as told by your health care provider. Ask your health care provider what activities are safe for you.  Keep all follow-up visits as told by your health care provider. This is important. How is this prevented? Take these actions to reduce your risk of viral infection:  Eat a healthy diet and get enough rest.  Wash your hands often with soap and water. This is especially important when you are in public places. If soap and water are not available, use hand sanitizer.  Avoid close contact with friends and family who have a viral illness.  If you travel to areas  where viral gastrointestinal infection is common, avoid drinking water or eating raw food.  Keep your immunizations up to date. Get a flu shot every year as told by your health care provider.  Do not share toothbrushes, nail clippers, razors, or needles with other people.  Always practice safe sex.  Contact a health care provider if:  You have symptoms of a viral illness that do not go away.  Your symptoms come back after going away.  Your symptoms get worse. Get help right away if:  You have trouble breathing.  You have a severe headache or a stiff neck.  You have severe vomiting or abdominal pain. This information is not intended to replace advice given to you by your health care provider. Make sure you discuss any questions you have with your health care provider. Document Released: 06/16/2015 Document Revised: 07/19/2015 Document Reviewed: 06/16/2015 Elsevier Interactive Patient Education  2018 [employer].  Viral Illness, Adult Viruses are tiny germs that can get into a person's body and cause illness. There are many different types of viruses, and they cause many types of illness.  Viral illnesses can range from mild to severe. They can affect various parts of the body. Common illnesses that are caused by a virus include colds and the flu. Viral illnesses also include serious conditions such as HIV/AIDS (human immunodeficiency virus/acquired immunodeficiency syndrome). A few viruses have been linked to certain cancers. What are the causes? Many types of viruses can cause illness. Viruses invade cells in your body, multiply, and cause the infected cells to malfunction or die. When the cell dies, it releases more of the virus. When this happens, you develop symptoms of the illness, and the virus continues to spread to other cells. If the virus takes over the function of the cell, it can cause the cell to divide and grow out of control, as is the case when a virus causes  cancer. Different viruses get into the body in different ways. You can get a virus by:  Swallowing food or water that is contaminated with the virus.  Breathing in droplets that have been coughed or sneezed into the air by an infected person.  Touching a surface that has been contaminated with the virus and then touching your eyes, nose, or mouth.  Being bitten by an insect or animal that carries the virus.  Having sexual contact with a person who is infected with the virus.  Being exposed to blood or fluids that contain the virus, either through an open cut or during a transfusion.  If a virus enters your body, your body's defense system (immune system) will try to fight the virus. You may be at higher risk for a viral illness if your immune system is weak. What are the signs or symptoms? Symptoms vary depending on the type of virus and the location of the cells that it invades. Common symptoms of the main types of viral illnesses include: Cold and flu viruses  Fever.  Headache.  Sore throat.  Muscle aches.  Nasal congestion.  Cough. Digestive system (gastrointestinal) viruses  Fever.  Abdominal pain.  Nausea.  Diarrhea. Liver viruses (hepatitis)  Loss of appetite.  Tiredness.  Yellowing of the skin (jaundice). Brain and spinal cord viruses  Fever.  Headache.  Stiff neck.  Nausea and vomiting.  Confusion or sleepiness. Skin viruses  Warts.  Itching.  Rash. Sexually transmitted viruses  Discharge.  Swelling.  Redness.  Rash. How is this treated? Viruses can be difficult to treat because they live within cells. Antibiotic medicines do not treat viruses because these drugs do not get inside cells. Treatment for a viral illness may include:  Resting and drinking plenty of fluids.  Medicines to relieve symptoms. These can include over-the-counter medicine for pain and fever, medicines for cough or congestion, and medicines to relieve  diarrhea.  Antiviral medicines. These drugs are available only for certain types of viruses. They may help reduce flu symptoms if taken early. There are also many antiviral medicines for hepatitis and HIV/AIDS.  Some viral illnesses can be prevented with vaccinations. A common example is the flu shot. Follow these instructions at home: Medicines   Take over-the-counter and prescription medicines only as told by your health care provider.  If you were prescribed an antiviral medicine, take it as told by your health care provider. Do not stop taking the medicine even if you start to feel better.  Be aware of when antibiotics are needed and when they are not needed. Antibiotics do not treat viruses. If your health care provider thinks that you may have a bacterial infection as  well as a viral infection, you may get an antibiotic. ? Do not ask for an antibiotic prescription if you have been diagnosed with a viral illness. That will not make your illness go away faster. ? Frequently taking antibiotics when they are not needed can lead to antibiotic resistance. When this develops, the medicine no longer works against the bacteria that it normally fights. General instructions  Drink enough fluids to keep your urine clear or pale yellow.  Rest as much as possible.  Return to your normal activities as told by your health care provider. Ask your health care provider what activities are safe for you.  Keep all follow-up visits as told by your health care provider. This is important. How is this prevented? Take these actions to reduce your risk of viral infection:  Eat a healthy diet and get enough rest.  Wash your hands often with soap and water. This is especially important when you are in public places. If soap and water are not available, use hand sanitizer.  Avoid close contact with friends and family who have a viral illness.  If you travel to areas where viral gastrointestinal infection  is common, avoid drinking water or eating raw food.  Keep your immunizations up to date. Get a flu shot every year as told by your health care provider.  Do not share toothbrushes, nail clippers, razors, or needles with other people.  Always practice safe sex.  Contact a health care provider if:  You have symptoms of a viral illness that do not go away.  Your symptoms come back after going away.  Your symptoms get worse. Get help right away if:  You have trouble breathing.  You have a severe headache or a stiff neck.  You have severe vomiting or abdominal pain. This information is not intended to replace advice given to you by your health care provider. Make sure you discuss any questions you have with your health care provider. Document Released: 06/16/2015 Document Revised: 07/19/2015 Document Reviewed: 06/16/2015 Elsevier Interactive Patient Education  [employer].

## 2017-12-16 NOTE — Progress Notes (Signed)
Casey Huerta 62 y.o.   Chief Complaint  Patient presents with  . Medication Refill    xanax     HISTORY OF PRESENT ILLNESS: This is a 62 y.o. female complaining of flulike symptoms that started last Friday, 5 days ago.  It started with fever and chills followed by "feeling really bad" with generalized achiness, cold sores on her lips, a stye on the left eye, no appetite, general weakness.  Feels a little better today. Also has a history of periodic anxiety, requesting Xanax refill. No other concern or medical complaint.  HPI   Prior to Admission medications   Medication Sig Start Date End Date Taking? Authorizing Provider  ALPRAZolam Duanne Moron) 0.25 MG tablet TAKE ONE TABLET BY MOUTH AT BEDTIME AS NEEDED FOR ANXIETY 11/02/17  Yes Weber, Sarah L, PA-C  amLODipine (NORVASC) 5 MG tablet Take 1 tablet (5 mg total) by mouth daily. 05/06/17  Yes Weber, Damaris Hippo, PA-C  aspirin 81 MG chewable tablet Chew 1 tablet (81 mg total) by mouth daily. 01/19/17  Yes Duke, Tami Lin, PA  celecoxib (CELEBREX) 200 MG capsule Take 1 capsule (200 mg total) by mouth daily. 07/23/17  Yes Weber, Sarah L, PA-C  fluticasone (FLONASE) 50 MCG/ACT nasal spray Place 2 sprays into both nostrils daily. 05/06/17  Yes Weber, Damaris Hippo, PA-C  Multiple Vitamins-Minerals (MULTIVITAMIN WITH MINERALS) tablet Take 1 tablet by mouth daily.   Yes [provider]  nitroGLYCERIN (NITROSTAT) 0.4 MG SL tablet Place 1 tablet (0.4 mg total) under the tongue every 5 (five) minutes x 3 doses as needed for chest pain. 01/18/17  Yes Duke, Tami Lin, PA  rosuvastatin (CRESTOR) 20 MG tablet Take 1 tablet (20 mg total) by mouth daily. 03/25/17  Yes Weber, Damaris Hippo, PA-C  traZODone (DESYREL) 50 MG tablet Take 0.5-1 tablets (25-50 mg total) by mouth at bedtime as needed for sleep. 05/06/17  Yes Weber, Sarah L, PA-C  triamcinolone cream (KENALOG) 0.5 % Apply 1 application topically 2 (two) times daily. To fingers as needed for dry  skin Patient taking differently: Apply 1 application topically 2 (two) times daily as needed (for dry skin).  01/20/16  Yes Weber, Damaris Hippo, PA-C    Allergies  Allergen Reactions  . Simvastatin Other (See Comments)    REACTION: constipation    Patient Active Problem List   Diagnosis Date Noted  . Chest pain with moderate risk of acute coronary syndrome 01/16/2017  . Osteoarthritis of both knees 06/14/2016  . Breast mass 05/17/2016  . Aortic insufficiency 03/06/2012  . Dyslipidemia 06/19/2007  . Essential hypertension 06/19/2007  . Hx of CABG 06/19/2007    Past Medical History:  Diagnosis Date  . Aortic insufficiency    a. Mild-mod AI by echo 2010.  . Breast mass 05/17/2016   dr feels lump inner right quad pt cannot feel lump  . COPD   . CORONARY ARTERY DISEASE    a. CABG (LIMA-->LAD 2001). b. Myoview 2008 no ischemia, EF 63%.  c. cath 10/14/2013 mild 20% ost LAD, atretic LIMA, EF 55%, medical therapy  . HYPERLIPIDEMIA   . HYPERTENSION   . MENORRHAGIA     Past Surgical History:  Procedure Laterality Date  . arthroscopic knee surgery    . CESAREAN SECTION    . CORONARY ARTERY BYPASS GRAFT    . LEFT HEART CATH AND CORONARY ANGIOGRAPHY N/A 01/17/2017   Procedure: LEFT HEART CATH AND CORONARY ANGIOGRAPHY;  Surgeon: Belva Crome, MD;  Location: [facility];  Service: Cardiovascular;  Laterality: N/A;  . LEFT HEART CATHETERIZATION WITH CORONARY/GRAFT ANGIOGRAM N/A 10/12/2013   Procedure: LEFT HEART CATHETERIZATION WITH Beatrix Fetters;  Surgeon: Troy Sine, MD;  Location: [facility] CATH LAB;  Service: Cardiovascular;  Laterality: N/A;    Social History   Socioeconomic History  . Marital status: Married    Spouse name: Not on file  . Number of children: Not on file  . Years of education: Not on file  . Highest education level: Not on file  Occupational History  . Not on file  Social Needs  . Financial resource strain: Not on file  . Food insecurity:     Worry: Not on file    Inability: Not on file  . Transportation needs:    Medical: Not on file    Non-medical: Not on file  Tobacco Use  . Smoking status: Former Smoker    Last attempt to quit: 1980    Years since quitting: 39.8  . Smokeless tobacco: Never Used  . Tobacco comment: QUIT SMOKING IN 1999  Substance and Sexual Activity  . Alcohol use: Yes    Comment: friday nights - 2 glasses - rare  . Drug use: No    Comment:  quit using in 1985  . Sexual activity: Yes    Partners: Male  Lifestyle  . Physical activity:    Days per week: Not on file    Minutes per session: Not on file  . Stress: Not on file  Relationships  . Social connections:    Talks on phone: Not on file    Gets together: Not on file    Attends religious service: Not on file    Active member of club or organization: Not on file    Attends meetings of clubs or organizations: Not on file    Relationship status: Not on file  . Intimate partner violence:    Fear of current or ex partner: Not on file    Emotionally abused: Not on file    Physically abused: Not on file    Forced sexual activity: Not on file  Other Topics Concern  . Not on file  Social History Narrative   Marital status: married   Children: 3 chidlren and 16 grandchildren and 1 great grandchild   Lives with: husband   Employment: customer service [name]   Tobacco:  quit   Alcohol:  Friday nights   Drugs:  none   Exercise:  no   Seatbelt: 100%   Guns in home: rifle       Secured: yes    Family History  Problem Relation Age of Onset  . Heart disease Mother   . Heart disease Sister   . Cancer Sister      Review of Systems  Constitutional: Positive for chills and fever.  HENT: Positive for congestion. Negative for ear pain and sore throat.   Eyes: Negative for blurred vision, double vision, discharge and redness.  Respiratory: Negative for cough and shortness of breath.   Cardiovascular: Negative for chest pain and  palpitations.  Gastrointestinal: Negative for abdominal pain, nausea and vomiting.  Genitourinary: Negative.   Musculoskeletal: Positive for myalgias.  Skin: Negative.  Negative for rash.  Neurological: Negative.  Negative for dizziness and headaches.  Endo/Heme/Allergies: Negative.   All other systems reviewed and are negative.    Vitals:   12/16/17 1530  BP: 137/75  Pulse: 81  Resp: 17  Temp: 98.6 F (37 C)  SpO2: 98%     Physical Exam  Constitutional: She is oriented to person, place, and time. She appears well-developed and well-nourished.  HENT:  Head: Normocephalic and atraumatic.  Mouth/Throat: Oropharynx is clear and moist.  Simple lip sores.  Eyes: Pupils are equal, round, and reactive to light. Conjunctivae and EOM are normal.  Positive stye left upper eyelid  Neck: Normal range of motion. Neck supple.  Cardiovascular: Normal rate, regular rhythm and normal heart sounds.  Pulmonary/Chest: Effort normal and breath sounds normal.  Musculoskeletal: Normal range of motion.  Lymphadenopathy:    She has no cervical adenopathy.  Neurological: She is alert and oriented to person, place, and time. No sensory deficit. She exhibits normal muscle tone.  Skin: Skin is warm and dry. Capillary refill takes less than 2 seconds.  Psychiatric: She has a normal mood and affect. Her behavior is normal.  Vitals reviewed.  Results for orders placed or performed in visit on 12/16/17 (from the past 24 hour(s))  POC Influenza A&B(BINAX/QUICKVUE)     Status: None   Collection Time: 12/16/17  4:13 PM  Result Value Ref Range   Influenza A, POC Negative Negative   Influenza B, POC Negative Negative     ASSESSMENT & PLAN:  Cherylann was seen today for medication refill.  Diagnoses and all orders for this visit:  Flu-like symptoms -     POC Influenza A&B(BINAX/QUICKVUE)  Viral illness  Stress reaction -     ALPRAZolam (XANAX) 0.25 MG tablet; TAKE ONE TABLET BY MOUTH AT BEDTIME AS  NEEDED FOR ANXIETY  Generalized muscle ache -     Discontinue: HYDROcodone-acetaminophen (NORCO) 5-325 MG tablet; Take 1 tablet by mouth every 6 (six) hours as needed for moderate pain or severe pain. -     HYDROcodone-acetaminophen (NORCO) 5-325 MG tablet; Take 1 tablet by mouth every 6 (six) hours as needed for moderate pain or severe pain.    Patient Instructions       If you have lab work done today you will be contacted with your lab results within the next 2 weeks.  If you have not heard from [place] then please contact us. The fastest way to get your results is to register for My Chart.   IF you received an x-ray today, you will receive an invoice from [facility]. Please contact [facility] at [phone] with questions or concerns regarding your invoice.   IF you received labwork today, you will receive an invoice from [facility]. Please contact [facility] at [phone] with questions or concerns regarding your invoice.   Our billing staff will not be able to assist you with questions regarding bills from these companies.  You will be contacted with the lab results as soon as they are available. The fastest way to get your results is to activate your My Chart account. Instructions are located on the last page of this paperwork. If you have not heard from [place] regarding the results in 2 weeks, please contact this office.      Viral Illness, Adult Viruses are tiny germs that can get into a person's body and cause illness. There are many different types of viruses, and they cause many types of illness. Viral illnesses can range from mild to severe. They can affect various parts of the body. Common illnesses that are caused by a virus include colds and the flu. Viral illnesses also include serious conditions such as HIV/AIDS (human immunodeficiency virus/acquired immunodeficiency syndrome). A few viruses have been linked to  certain cancers. What are the causes? Many  types of viruses can cause illness. Viruses invade cells in your body, multiply, and cause the infected cells to malfunction or die. When the cell dies, it releases more of the virus. When this happens, you develop symptoms of the illness, and the virus continues to spread to other cells. If the virus takes over the function of the cell, it can cause the cell to divide and grow out of control, as is the case when a virus causes cancer. Different viruses get into the body in different ways. You can get a virus by:  Swallowing food or water that is contaminated with the virus.  Breathing in droplets that have been coughed or sneezed into the air by an infected person.  Touching a surface that has been contaminated with the virus and then touching your eyes, nose, or mouth.  Being bitten by an insect or animal that carries the virus.  Having sexual contact with a person who is infected with the virus.  Being exposed to blood or fluids that contain the virus, either through an open cut or during a transfusion.  If a virus enters your body, your body's defense system (immune system) will try to fight the virus. You may be at higher risk for a viral illness if your immune system is weak. What are the signs or symptoms? Symptoms vary depending on the type of virus and the location of the cells that it invades. Common symptoms of the main types of viral illnesses include: Cold and flu viruses  Fever.  Headache.  Sore throat.  Muscle aches.  Nasal congestion.  Cough. Digestive system (gastrointestinal) viruses  Fever.  Abdominal pain.  Nausea.  Diarrhea. Liver viruses (hepatitis)  Loss of appetite.  Tiredness.  Yellowing of the skin (jaundice). Brain and spinal cord viruses  Fever.  Headache.  Stiff neck.  Nausea and vomiting.  Confusion or sleepiness. Skin viruses  Warts.  Itching.  Rash. Sexually transmitted  viruses  Discharge.  Swelling.  Redness.  Rash. How is this treated? Viruses can be difficult to treat because they live within cells. Antibiotic medicines do not treat viruses because these drugs do not get inside cells. Treatment for a viral illness may include:  Resting and drinking plenty of fluids.  Medicines to relieve symptoms. These can include over-the-counter medicine for pain and fever, medicines for cough or congestion, and medicines to relieve diarrhea.  Antiviral medicines. These drugs are available only for certain types of viruses. They may help reduce flu symptoms if taken early. There are also many antiviral medicines for hepatitis and HIV/AIDS.  Some viral illnesses can be prevented with vaccinations. A common example is the flu shot. Follow these instructions at home: Medicines   Take over-the-counter and prescription medicines only as told by your health care provider.  If you were prescribed an antiviral medicine, take it as told by your health care provider. Do not stop taking the medicine even if you start to feel better.  Be aware of when antibiotics are needed and when they are not needed. Antibiotics do not treat viruses. If your health care provider thinks that you may have a bacterial infection as well as a viral infection, you may get an antibiotic. ? Do not ask for an antibiotic prescription if you have been diagnosed with a viral illness. That will not make your illness go away faster. ? Frequently taking antibiotics when they are not needed can lead to antibiotic resistance.  When this develops, the medicine no longer works against the bacteria that it normally fights. General instructions  Drink enough fluids to keep your urine clear or pale yellow.  Rest as much as possible.  Return to your normal activities as told by your health care provider. Ask your health care provider what activities are safe for you.  Keep all follow-up visits as told by  your health care provider. This is important. How is this prevented? Take these actions to reduce your risk of viral infection:  Eat a healthy diet and get enough rest.  Wash your hands often with soap and water. This is especially important when you are in public places. If soap and water are not available, use hand sanitizer.  Avoid close contact with friends and family who have a viral illness.  If you travel to areas where viral gastrointestinal infection is common, avoid drinking water or eating raw food.  Keep your immunizations up to date. Get a flu shot every year as told by your health care provider.  Do not share toothbrushes, nail clippers, razors, or needles with other people.  Always practice safe sex.  Contact a health care provider if:  You have symptoms of a viral illness that do not go away.  Your symptoms come back after going away.  Your symptoms get worse. Get help right away if:  You have trouble breathing.  You have a severe headache or a stiff neck.  You have severe vomiting or abdominal pain. This information is not intended to replace advice given to you by your health care provider. Make sure you discuss any questions you have with your health care provider. Document Released: 06/16/2015 Document Revised: 07/19/2015 Document Reviewed: 06/16/2015 Elsevier Interactive Patient Education  2018 [employer].  Viral Illness, Adult Viruses are tiny germs that can get into a person's body and cause illness. There are many different types of viruses, and they cause many types of illness. Viral illnesses can range from mild to severe. They can affect various parts of the body. Common illnesses that are caused by a virus include colds and the flu. Viral illnesses also include serious conditions such as HIV/AIDS (human immunodeficiency virus/acquired immunodeficiency syndrome). A few viruses have been linked to certain cancers. What are the causes? Many types of  viruses can cause illness. Viruses invade cells in your body, multiply, and cause the infected cells to malfunction or die. When the cell dies, it releases more of the virus. When this happens, you develop symptoms of the illness, and the virus continues to spread to other cells. If the virus takes over the function of the cell, it can cause the cell to divide and grow out of control, as is the case when a virus causes cancer. Different viruses get into the body in different ways. You can get a virus by:  Swallowing food or water that is contaminated with the virus.  Breathing in droplets that have been coughed or sneezed into the air by an infected person.  Touching a surface that has been contaminated with the virus and then touching your eyes, nose, or mouth.  Being bitten by an insect or animal that carries the virus.  Having sexual contact with a person who is infected with the virus.  Being exposed to blood or fluids that contain the virus, either through an open cut or during a transfusion.  If a virus enters your body, your body's defense system (immune system) will try to fight the  virus. You may be at higher risk for a viral illness if your immune system is weak. What are the signs or symptoms? Symptoms vary depending on the type of virus and the location of the cells that it invades. Common symptoms of the main types of viral illnesses include: Cold and flu viruses  Fever.  Headache.  Sore throat.  Muscle aches.  Nasal congestion.  Cough. Digestive system (gastrointestinal) viruses  Fever.  Abdominal pain.  Nausea.  Diarrhea. Liver viruses (hepatitis)  Loss of appetite.  Tiredness.  Yellowing of the skin (jaundice). Brain and spinal cord viruses  Fever.  Headache.  Stiff neck.  Nausea and vomiting.  Confusion or sleepiness. Skin viruses  Warts.  Itching.  Rash. Sexually transmitted viruses  Discharge.  Swelling.  Redness.  Rash. How  is this treated? Viruses can be difficult to treat because they live within cells. Antibiotic medicines do not treat viruses because these drugs do not get inside cells. Treatment for a viral illness may include:  Resting and drinking plenty of fluids.  Medicines to relieve symptoms. These can include over-the-counter medicine for pain and fever, medicines for cough or congestion, and medicines to relieve diarrhea.  Antiviral medicines. These drugs are available only for certain types of viruses. They may help reduce flu symptoms if taken early. There are also many antiviral medicines for hepatitis and HIV/AIDS.  Some viral illnesses can be prevented with vaccinations. A common example is the flu shot. Follow these instructions at home: Medicines   Take over-the-counter and prescription medicines only as told by your health care provider.  If you were prescribed an antiviral medicine, take it as told by your health care provider. Do not stop taking the medicine even if you start to feel better.  Be aware of when antibiotics are needed and when they are not needed. Antibiotics do not treat viruses. If your health care provider thinks that you may have a bacterial infection as well as a viral infection, you may get an antibiotic. ? Do not ask for an antibiotic prescription if you have been diagnosed with a viral illness. That will not make your illness go away faster. ? Frequently taking antibiotics when they are not needed can lead to antibiotic resistance. When this develops, the medicine no longer works against the bacteria that it normally fights. General instructions  Drink enough fluids to keep your urine clear or pale yellow.  Rest as much as possible.  Return to your normal activities as told by your health care provider. Ask your health care provider what activities are safe for you.  Keep all follow-up visits as told by your health care provider. This is important. How is this  prevented? Take these actions to reduce your risk of viral infection:  Eat a healthy diet and get enough rest.  Wash your hands often with soap and water. This is especially important when you are in public places. If soap and water are not available, use hand sanitizer.  Avoid close contact with friends and family who have a viral illness.  If you travel to areas where viral gastrointestinal infection is common, avoid drinking water or eating raw food.  Keep your immunizations up to date. Get a flu shot every year as told by your health care provider.  Do not share toothbrushes, nail clippers, razors, or needles with other people.  Always practice safe sex.  Contact a health care provider if:  You have symptoms of a viral illness that do  not go away.  Your symptoms come back after going away.  Your symptoms get worse. Get help right away if:  You have trouble breathing.  You have a severe headache or a stiff neck.  You have severe vomiting or abdominal pain. This information is not intended to replace advice given to you by your health care provider. Make sure you discuss any questions you have with your health care provider. Document Released: 06/16/2015 Document Revised: 07/19/2015 Document Reviewed: 06/16/2015 Elsevier Interactive Patient Education  2018 Elsevier Inc.      Agustina Caroli, MD [facility]

## 2017-12-21 ENCOUNTER — Encounter: Payer: Self-pay | Admitting: Emergency Medicine

## 2017-12-22 NOTE — Telephone Encounter (Signed)
Pt called needing a response to her message about a work note. She is still not feeling well.

## 2017-12-23 NOTE — Telephone Encounter (Signed)
Copied from [place] [id number]. Topic: General - Other >> Dec 22, 2017  4:27 PM Sheran Luz wrote: Reason for CRM: Patient called to check status of mychart message to Dr. Mitchel Honour regarding a work note. Pt is requesting a call back as soon as possible.

## 2017-12-23 NOTE — Telephone Encounter (Signed)
Please extend her work note a couple more days. Thanks.

## 2017-12-24 NOTE — Telephone Encounter (Signed)
Copied from [place] [id number]. Topic: General - Other >> Dec 22, 2017  4:27 PM Sheran Luz wrote: Reason for CRM: Patient called to check status of mychart message to Dr. Mitchel Honour regarding a work note. Pt is requesting a call back as soon as possible. >> Dec 23, 2017 12:45 PM Scherrie Gerlach wrote: Pt would like to stay out the rest of the week and return to work on Monday, Nov 11. Please call when ready for pick up. >> Dec 24, 2017  1:41 PM Yvette Rack wrote: Pt calling stating that she need the work note extended until Monday the letter told her to return back to work tomorrow she already told her job that she would be back to work on [employer] because she still have sores on her nose and it should be better by then please give her a call back at [phone] she would like a call back today not tomorrow so she can get back with her job

## 2017-12-24 NOTE — Telephone Encounter (Signed)
Letter composed and sent via MyChart.

## 2017-12-25 ENCOUNTER — Encounter: Payer: Self-pay | Admitting: Emergency Medicine

## 2017-12-26 NOTE — Telephone Encounter (Signed)
Patient is calling back to check the status of the work note.  The office was called. And was advised that Dr. Mitchel Honour is not working today. I advised the patient

## 2017-12-29 ENCOUNTER — Telehealth: Payer: Self-pay | Admitting: Emergency Medicine

## 2017-12-29 DIAGNOSIS — Z0271 Encounter for disability determination: Secondary | ICD-10-CM

## 2017-12-29 NOTE — Telephone Encounter (Signed)
Copied from [place] [id number]. Topic: General - Other >> Dec 29, 2017 10:28 AM Parke Poisson wrote: Reason for CRM: Estill Bamberg from [place] called requesting a call back to confirm patient's diagnosis at last office visit and when she is able to return to work.Call back # [phone]

## 2017-12-31 NOTE — Telephone Encounter (Signed)
Patient brought in FMLA forms to be completed by Dr Mitchel Honour for her last OV for the flu. I have completed the forms they just need to be signed. I will place the forms in Dr Barry Brunner box on 12/31/17 please return to the FMLA/Disability box at the checkout desk within 5-7 business days. Thank you!

## 2018-01-01 NOTE — Telephone Encounter (Signed)
Done. Thanks.

## 2018-01-02 ENCOUNTER — Telehealth: Payer: Self-pay | Admitting: Emergency Medicine

## 2018-01-02 NOTE — Telephone Encounter (Signed)
Pt called in to follow up on paperwork. Pt says that she will be by the office today for pick up

## 2018-01-02 NOTE — Telephone Encounter (Signed)
Pt came by and stated that she was told by someone at the office that her fmla/disability ppw was ready.. It wasn't in the paper/rx bin pt stated that she would come back on Monday 11/18

## 2018-01-05 NOTE — Telephone Encounter (Signed)
Pt called in and would like to know if the FMLA paperwork is ready??  She would like to pick them up today If she does not answer please leave a message, pt is at work   [employer] number  [phone]

## 2018-01-06 ENCOUNTER — Other Ambulatory Visit: Payer: Self-pay | Admitting: Emergency Medicine

## 2018-01-06 DIAGNOSIS — F430 Acute stress reaction: Secondary | ICD-10-CM

## 2018-01-06 DIAGNOSIS — M791 Myalgia, unspecified site: Secondary | ICD-10-CM

## 2018-01-06 NOTE — Telephone Encounter (Signed)
Paperwork scanned and put in the pick up drawer, also sent to the patients mychart account

## 2018-01-06 NOTE — Telephone Encounter (Signed)
Requested medication (s) are due for refill today: yes  Requested medication (s) are on the active medication list: yes  Last refill:  12/16/17 #20  Future visit scheduled: No  Notes to clinic:  Unable to refill per protocol    Requested Prescriptions  Pending Prescriptions Disp Refills   ALPRAZolam (XANAX) 0.25 MG tablet [Pharmacy Med Name: ALPRAZolam 0.25 MG TABLET] 20 tablet 0    Sig: TAKE ONE TABLET BY MOUTH AT BEDTIME AS NEEDED FOR ANXIETY     Not Delegated - Psychiatry:  Anxiolytics/Hypnotics Failed - 01/06/2018  3:48 PM      Failed - This refill cannot be delegated      Failed - Urine Drug Screen completed in last 360 days.      Passed - Valid encounter within last 6 months    Recent Outpatient Visits          3 weeks ago Flu-like symptoms   [facility] at [facility], Ines Bloomer, MD   8 months ago Insomnia, unspecified type   Primary Care at [place], PA-C   9 months ago Annual physical exam   Primary Care at [place], PA-C   1 year ago Acute pain of left knee   [facility] at [facility], Damaris Hippo, PA-C   1 year ago Annual physical exam   [facility] at [place], PA-C

## 2018-01-08 NOTE — Telephone Encounter (Signed)
Patient is requesting a refill of the following medications: Requested Prescriptions   Pending Prescriptions Disp Refills  . ALPRAZolam (XANAX) 0.25 MG tablet [Pharmacy Med Name: ALPRAZolam 0.25 MG TABLET] 20 tablet 0    Sig: TAKE ONE TABLET BY MOUTH AT BEDTIME AS NEEDED FOR ANXIETY    Date of patient request: 01/06/2018 Last office visit: 12/16/2017 Date of last refill: 12/16/2017 Last refill amount: 20 Follow up time period per chart:

## 2018-01-20 ENCOUNTER — Other Ambulatory Visit: Payer: Self-pay | Admitting: Cardiology

## 2018-01-20 ENCOUNTER — Other Ambulatory Visit: Payer: Self-pay | Admitting: *Deleted

## 2018-01-20 ENCOUNTER — Telehealth: Payer: Self-pay | Admitting: Cardiology

## 2018-01-20 MED ORDER — NITROGLYCERIN 0.4 MG SL SUBL: 0.4000 mg | SUBLINGUAL_TABLET | SUBLINGUAL | 1 refills | Status: AC | PRN

## 2018-01-20 NOTE — Telephone Encounter (Signed)
° ° ° °*  STAT* If patient is at the pharmacy, call can be transferred to refill team.   1. Which medications need to be refilled? (please list name of each medication and dose if known) nitroGLYCERIN (NITROSTAT) 0.4 MG SL tablet  2. Which pharmacy/location (including street and city if local pharmacy) is medication to be sent to? [name] [address], [place]  3. Do they need a 30 day or 90 day supply? [facility]

## 2018-01-20 NOTE — Telephone Encounter (Signed)
Spoke with pt, she reports occ SOB and chest pain but nothing that she wants to be seen for right away. She is wanting to talk about getting long term disability. She reports she has trouble making it through the day due to several issues. She has a follow up appointment in January and is fine with that appt. She will call back prior to appointment with problems.

## 2018-01-20 NOTE — Telephone Encounter (Signed)
Refill sent to the pharmacy electronically.  

## 2018-01-20 NOTE — Telephone Encounter (Addendum)
° ° °  Offered patient appointment for today with APP; she declined. Offered several other appointments, she declined.  Patient states she would like to discuss disability.   Pt c/o of Chest Pain: STAT if CP now or developed within 24 hours  1. Are you having CP right now? no  2. Are you experiencing any other symptoms (ex. SOB, nausea, vomiting, sweating)? Tired, weakness 3. How long have you been experiencing CP? weeks  4. Is your CP continuous or coming and going? Coming and going  5. Have you taken Nitroglycerin? Took Nitro 2 weeks ago ?

## 2018-01-27 ENCOUNTER — Other Ambulatory Visit: Payer: Self-pay | Admitting: Emergency Medicine

## 2018-01-27 DIAGNOSIS — F430 Acute stress reaction: Secondary | ICD-10-CM

## 2018-02-12 ENCOUNTER — Encounter: Payer: Self-pay | Admitting: Cardiology

## 2018-02-17 ENCOUNTER — Encounter: Payer: Self-pay | Admitting: Emergency Medicine

## 2018-02-19 ENCOUNTER — Other Ambulatory Visit: Payer: Self-pay

## 2018-02-19 ENCOUNTER — Emergency Department (HOSPITAL_COMMUNITY): Payer: Managed Care, Other (non HMO)

## 2018-02-19 ENCOUNTER — Emergency Department (HOSPITAL_COMMUNITY)
Admission: EM | Admit: 2018-02-19 | Discharge: 2018-02-20 | Disposition: A | Discharge status: Home / Self Care | Payer: Managed Care, Other (non HMO) | Attending: Emergency Medicine | Admitting: Emergency Medicine

## 2018-02-19 ENCOUNTER — Encounter (HOSPITAL_COMMUNITY): Payer: Self-pay | Admitting: Emergency Medicine

## 2018-02-19 DIAGNOSIS — Z79899 Other long term (current) drug therapy: Secondary | ICD-10-CM | POA: Diagnosis not present

## 2018-02-19 DIAGNOSIS — R69 Illness, unspecified: Secondary | ICD-10-CM

## 2018-02-19 DIAGNOSIS — I10 Essential (primary) hypertension: Secondary | ICD-10-CM | POA: Diagnosis not present

## 2018-02-19 DIAGNOSIS — J111 Influenza due to unidentified influenza virus with other respiratory manifestations: Secondary | ICD-10-CM | POA: Diagnosis not present

## 2018-02-19 DIAGNOSIS — Z951 Presence of aortocoronary bypass graft: Secondary | ICD-10-CM | POA: Insufficient documentation

## 2018-02-19 DIAGNOSIS — I2510 Atherosclerotic heart disease of native coronary artery without angina pectoris: Secondary | ICD-10-CM | POA: Diagnosis not present

## 2018-02-19 DIAGNOSIS — R0789 Other chest pain: Secondary | ICD-10-CM

## 2018-02-19 DIAGNOSIS — N12 Tubulo-interstitial nephritis, not specified as acute or chronic: Secondary | ICD-10-CM | POA: Diagnosis not present

## 2018-02-19 DIAGNOSIS — Z7982 Long term (current) use of aspirin: Secondary | ICD-10-CM | POA: Insufficient documentation

## 2018-02-19 DIAGNOSIS — R509 Fever, unspecified: Secondary | ICD-10-CM | POA: Diagnosis present

## 2018-02-19 DIAGNOSIS — J449 Chronic obstructive pulmonary disease, unspecified: Secondary | ICD-10-CM | POA: Insufficient documentation

## 2018-02-19 DIAGNOSIS — Z87891 Personal history of nicotine dependence: Secondary | ICD-10-CM | POA: Diagnosis not present

## 2018-02-19 LAB — URINALYSIS, ROUTINE W REFLEX MICROSCOPIC
Bilirubin Urine: NEGATIVE
Glucose, UA: NEGATIVE mg/dL
Ketones, ur: NEGATIVE mg/dL
NITRITE: POSITIVE — AB
Protein, ur: NEGATIVE mg/dL
Specific Gravity, Urine: 1.01 (ref 1.005–1.030)
pH: 6 (ref 5.0–8.0)

## 2018-02-19 LAB — COMPREHENSIVE METABOLIC PANEL
ALT: 19 U/L (ref 0–44)
AST: 30 U/L (ref 15–41)
Albumin: 3.8 g/dL (ref 3.5–5.0)
Alkaline Phosphatase: 76 U/L (ref 38–126)
Anion gap: 11 (ref 5–15)
BILIRUBIN TOTAL: 0.8 mg/dL (ref 0.3–1.2)
BUN: 14 mg/dL (ref 8–23)
CO2: 21 mmol/L — ABNORMAL LOW (ref 22–32)
Calcium: 9.5 mg/dL (ref 8.9–10.3)
Chloride: 103 mmol/L (ref 98–111)
Creatinine, Ser: 1.15 mg/dL — ABNORMAL HIGH (ref 0.44–1.00)
GFR calc Af Amer: 59 mL/min — ABNORMAL LOW (ref >60)
GFR calc non Af Amer: 51 mL/min — ABNORMAL LOW (ref >60)
GLUCOSE: 134 mg/dL — AB (ref 70–99)
Potassium: 3.5 mmol/L (ref 3.5–5.1)
Sodium: 135 mmol/L (ref 135–145)
TOTAL PROTEIN: 7.3 g/dL (ref 6.5–8.1)

## 2018-02-19 LAB — CBC
HEMATOCRIT: 38.8 % (ref 36.0–46.0)
Hemoglobin: 12.5 g/dL (ref 12.0–15.0)
MCH: 28.7 pg (ref 26.0–34.0)
MCHC: 32.2 g/dL (ref 30.0–36.0)
MCV: 89 fL (ref 80.0–100.0)
Platelets: 196 10*3/uL (ref 150–400)
RBC: 4.36 MIL/uL (ref 3.87–5.11)
RDW: 12.2 % (ref 11.5–15.5)
WBC: 13.8 10*3/uL — AB (ref 4.0–10.5)
nRBC: 0 % (ref 0.0–0.2)

## 2018-02-19 LAB — PROTIME-INR
INR: 1.07
Prothrombin Time: 13.8 seconds (ref 11.4–15.2)

## 2018-02-19 LAB — I-STAT CG4 LACTIC ACID, ED: Lactic Acid, Venous: 1.67 mmol/L (ref 0.5–1.9)

## 2018-02-19 MED ORDER — ACETAMINOPHEN 325 MG PO TABS
650.0000 mg | ORAL_TABLET | Freq: Once | ORAL | Status: AC | PRN
  Administered 2018-02-19: 650 mg via ORAL
  Filled 2018-02-19: qty 2

## 2018-02-19 NOTE — ED Triage Notes (Signed)
Onset today developed fever, chills, body achy, headache, lightheaded, feeling of passing out, and could not stop shaking. Alert answering and following commands appropriate.

## 2018-02-20 LAB — I-STAT TROPONIN, ED: TROPONIN I, POC: 0.02 ng/mL (ref 0.00–0.08)

## 2018-02-20 MED ORDER — CEPHALEXIN 500 MG PO CAPS: 500.0000 mg | ORAL_CAPSULE | Freq: Three times a day (TID) | ORAL | 0 refills | Status: AC

## 2018-02-20 MED ORDER — ONDANSETRON 4 MG PO TBDP: 4.0000 mg | ORAL_TABLET | Freq: Three times a day (TID) | ORAL | 0 refills | Status: AC | PRN

## 2018-02-20 MED ORDER — CEPHALEXIN 250 MG PO CAPS
500.0000 mg | ORAL_CAPSULE | Freq: Once | ORAL | Status: AC
  Administered 2018-02-20: 500 mg via ORAL
  Filled 2018-02-20: qty 2

## 2018-02-20 MED ORDER — ONDANSETRON 4 MG PO TBDP
4.0000 mg | ORAL_TABLET | Freq: Once | ORAL | Status: AC
  Administered 2018-02-20: 4 mg via ORAL
  Filled 2018-02-20: qty 1

## 2018-02-20 NOTE — ED Notes (Signed)
Patient verbalizes understanding of discharge instructions. Opportunity for questioning and answers were provided. Armband removed by staff, pt discharged from ED ambulatory.   

## 2018-02-20 NOTE — ED Provider Notes (Signed)
[facility] EMERGENCY DEPARTMENT Provider Note  CSN: [id number] Arrival date & time: 02/19/18 1854  Chief Complaint(s) Fever; Generalized Body Aches; Dizziness; and Headache  HPI Casey Huerta is a 63 y.o. female with a past medical history listed below who presents to the emergency department with 1 day of fevers, fatigue, lightheadedness and rigors.  Patient endorses several weeks of cough that worsened over the past week; intermittently productive.  She reports that she had a flulike illness 2 months ago which improved.  Additionally she reports nausea without emesis.  She is endorsing suprapubic discomfort with frequency.  Denies any dysuria.  No vomiting or diarrhea.  Patient is endorsing substernal chest pain described as sharp.  Says is been constant for 3 days.  Pain worse with coughing.   HPI  Past Medical History Past Medical History:  Diagnosis Date  . Aortic insufficiency    a. Mild-mod AI by echo 2010.  . Breast mass 05/17/2016   dr feels lump inner right quad pt cannot feel lump  . COPD   . CORONARY ARTERY DISEASE    a. CABG (LIMA-->LAD 2001). b. Myoview 2008 no ischemia, EF 63%.  c. cath 10/14/2013 mild 20% ost LAD, atretic LIMA, EF 55%, medical therapy  . HYPERLIPIDEMIA   . HYPERTENSION   . MENORRHAGIA    Patient Active Problem List   Diagnosis Date Noted  . Chest pain with moderate risk of acute coronary syndrome 01/16/2017  . Osteoarthritis of both knees 06/14/2016  . Breast mass 05/17/2016  . Aortic insufficiency 03/06/2012  . Dyslipidemia 06/19/2007  . Essential hypertension 06/19/2007  . Hx of CABG 06/19/2007   Home Medication(s) Prior to Admission medications   Medication Sig Start Date End Date Taking? Authorizing Provider  ALPRAZolam Duanne Moron) 0.25 MG tablet TAKE ONE TABLET BY MOUTH DAILY ONLY AS NEEDED FOR ANXIETY 01/30/18   Horald Pollen, MD  amLODipine (NORVASC) 5 MG tablet Take 1 tablet (5 mg total) by mouth daily. 05/06/17    Gale Journey, Damaris Hippo, PA-C  aspirin 81 MG chewable tablet Chew 1 tablet (81 mg total) by mouth daily. 01/19/17   [facility], Tami Lin, PA  celecoxib (CELEBREX) 200 MG capsule Take 1 capsule (200 mg total) by mouth daily. 07/23/17   Weber, Damaris Hippo, PA-C  cephALEXin (KEFLEX) 500 MG capsule Take 1 capsule (500 mg total) by mouth 3 (three) times daily for 10 days. 02/20/18 03/02/18  Fatima Blank, MD  fluticasone (FLONASE) 50 MCG/ACT nasal spray Place 2 sprays into both nostrils daily. 05/06/17   Weber, Damaris Hippo, PA-C  HYDROcodone-acetaminophen (NORCO) 5-325 MG tablet Take 1 tablet by mouth every 6 (six) hours as needed for moderate pain or severe pain. 12/16/17   Horald Pollen, MD  Multiple Vitamins-Minerals (MULTIVITAMIN WITH MINERALS) tablet Take 1 tablet by mouth daily.    [provider]  nitroGLYCERIN (NITROSTAT) 0.4 MG SL tablet Place 1 tablet (0.4 mg total) under the tongue every 5 (five) minutes x 3 doses as needed for chest pain. 01/20/18   Lelon Perla, MD  ondansetron (ZOFRAN ODT) 4 MG disintegrating tablet Take 1 tablet (4 mg total) by mouth every 8 (eight) hours as needed for up to 3 days for nausea or vomiting. 02/20/18 02/23/18  Fatima Blank, MD  rosuvastatin (CRESTOR) 20 MG tablet Take 1 tablet (20 mg total) by mouth daily. 03/25/17   Weber, Damaris Hippo, PA-C  traZODone (DESYREL) 50 MG tablet Take 0.5-1 tablets (25-50 mg total) by mouth at  bedtime as needed for sleep. 05/06/17   Weber, Damaris Hippo, PA-C  triamcinolone cream (KENALOG) 0.5 % Apply 1 application topically 2 (two) times daily. To fingers as needed for dry skin Patient taking differently: Apply 1 application topically 2 (two) times daily as needed (for dry skin).  01/20/16   Gale Journey Damaris Hippo, PA-C                                                                                                                                    Past Surgical History Past Surgical History:  Procedure Laterality Date  . arthroscopic  knee surgery    . CESAREAN SECTION    . CORONARY ARTERY BYPASS GRAFT    . LEFT HEART CATH AND CORONARY ANGIOGRAPHY N/A 01/17/2017   Procedure: LEFT HEART CATH AND CORONARY ANGIOGRAPHY;  Surgeon: Belva Crome, MD;  Location: [facility];  Service: Cardiovascular;  Laterality: N/A;  . LEFT HEART CATHETERIZATION WITH CORONARY/GRAFT ANGIOGRAM N/A 10/12/2013   Procedure: LEFT HEART CATHETERIZATION WITH Beatrix Fetters;  Surgeon: Troy Sine, MD;  Location: [facility] CATH LAB;  Service: Cardiovascular;  Laterality: N/A;   Family History Family History  Problem Relation Age of Onset  . Heart disease Mother   . Heart disease Sister   . Cancer Sister     Social History Social History   Tobacco Use  . Smoking status: Former Smoker    Last attempt to quit: 1980    Years since quitting: 40.0  . Smokeless tobacco: Never Used  . Tobacco comment: QUIT SMOKING IN 1999  Substance Use Topics  . Alcohol use: Yes    Comment: friday nights - 2 glasses - rare  . Drug use: No    Comment:  quit using in 1985   Allergies Simvastatin  Review of Systems Review of Systems All other systems are reviewed and are negative for acute change except as noted in the HPI  Physical Exam Vital Signs  I have reviewed the triage vital signs BP 124/64   Pulse 83   Temp 98.2 F (36.8 C)   Resp (!) 23   Ht 5\' 2"  (1.575 m)   Wt 61.2 kg   SpO2 98%   BMI 24.69 kg/m    Physical Exam Vitals signs reviewed.  Constitutional:      General: She is not in acute distress.    Appearance: She is well-developed. She is not diaphoretic.  HENT:     Head: Normocephalic and atraumatic.     Nose: Nose normal.  Eyes:     General: No scleral icterus.       Right eye: No discharge.        Left eye: No discharge.     Conjunctiva/sclera: Conjunctivae normal.     Pupils: Pupils are equal, round, and reactive to light.  Neck:     Musculoskeletal: Normal range of motion and neck supple.  Cardiovascular:  Rate and Rhythm: Normal rate and regular rhythm.     Heart sounds: No murmur. No friction rub. No gallop.   Pulmonary:     Effort: Pulmonary effort is normal. No respiratory distress.     Breath sounds: Normal breath sounds. No stridor. No rales.  Chest:     Chest wall: Tenderness present.    Abdominal:     General: There is no distension.     Palpations: Abdomen is soft.     Tenderness: There is abdominal tenderness in the suprapubic area.  Musculoskeletal:        General: No tenderness.  Skin:    General: Skin is warm and dry.     Findings: No erythema or rash.  Neurological:     Mental Status: She is alert and oriented to person, place, and time.     ED Results and Treatments Labs (all labs ordered are listed, but only abnormal results are displayed) Labs Reviewed  CBC - Abnormal; Notable for the following components:      Result Value   WBC 13.8 (*)    All other components within normal limits  URINALYSIS, ROUTINE W REFLEX MICROSCOPIC - Abnormal; Notable for the following components:   Hgb urine dipstick MODERATE (*)    Nitrite POSITIVE (*)    Leukocytes, UA TRACE (*)    Bacteria, UA MANY (*)    All other components within normal limits  COMPREHENSIVE METABOLIC PANEL - Abnormal; Notable for the following components:   CO2 21 (*)    Glucose, Bld 134 (*)    Creatinine, Ser 1.15 (*)    GFR calc non Af Amer 51 (*)    GFR calc Af Amer 59 (*)    All other components within normal limits  CULTURE, BLOOD (ROUTINE X 2)  CULTURE, BLOOD (ROUTINE X 2)  PROTIME-INR  CBG MONITORING, ED  I-STAT CG4 LACTIC ACID, ED  I-STAT CG4 LACTIC ACID, ED  I-STAT TROPONIN, ED                                                                                                                         EKG  EKG Interpretation  Date/Time:  Friday February 20 2018 03:48:26 EST Ventricular Rate:  68 PR Interval:    QRS Duration: 92 QT Interval:  444 QTC Calculation: 473 R Axis:   54 Text  Interpretation:  Sinus rhythm Nonspecific T wave abnormality RESOLVED SINCE PREVIOUS NO STEMI Confirmed by Addison Lank [phone]) on 02/20/2018 4:49:39 AM      Radiology Dg Chest 2 View  Result Date: 02/19/2018 CLINICAL DATA:  Today weak and aching all overNauseaFeverCenter of chest pain Hypertension medicationHx of heart surgery age 66 EXAM: CHEST - 2 VIEW COMPARISON:  01/16/2017 FINDINGS: Stable changes from a previous median sternotomy. Cardiac silhouette is normal in size and configuration. No mediastinal or hilar masses. No evidence of adenopathy. Small stable nodule, right lower lobe, consistent with a granuloma. Lungs otherwise clear. No pleural effusion or  pneumothorax. Skeletal structures are intact. IMPRESSION: No acute cardiopulmonary disease. Electronically Signed   By: Lajean Manes M.D.   On: 02/19/2018 20:19   Pertinent labs & imaging results that were available during my care of the patient were reviewed by me and considered in my medical decision making (see chart for details).  Medications Ordered in ED Medications  acetaminophen (TYLENOL) tablet 650 mg (650 mg Oral Given 02/19/18 1918)  ondansetron (ZOFRAN-ODT) disintegrating tablet 4 mg (4 mg Oral Given 02/20/18 0431)  cephALEXin (KEFLEX) capsule 500 mg (500 mg Oral Given 02/20/18 0451)                                                                                                                                    Procedures Procedures  (including critical care time)  Medical Decision Making / ED Course I have reviewed the nursing notes for this encounter and the patient's prior records (if available in EHR or on provided paperwork).    Patient noted to be febrile in triage.  Improved with Tylenol.  Labs notable for leukocytosis and evidence of urinary tract infection.  Likely a sending/pyelonephritis.  Given first dose of antibiotics in the emergency department.  Appropriate for outpatient management.  Lungs were clear to  auscultation and chest x-ray without evidence of pneumonia. Chest x-ray also without evidence suggestive of  pneumothorax, pneumomediastinum.  No abnormal contour of the mediastinum to suggest dissection.   Initial EKG showed diffuse ST segment depression likely from tachycardia.  A repeat EKG at a normal heart rate revealed no evidence of acute ischemic changes or pericarditis.  Troponin was negative.  Given her duration of symptoms, doubt ACS.  On review of records patient had a clean catheterization in 2018.  Doubt pulmonary embolism.  Not classic for aortic dissection or esophageal perforation.  Likely due to viral respiratory infection.    The patient appears reasonably screened and/or stabilized for discharge and I doubt any other medical condition or other [facility] requiring further screening, evaluation, or treatment in the ED at this time prior to discharge.  The patient is safe for discharge with strict return precautions.  Final Clinical Impression(s) / ED Diagnoses Final diagnoses:  Pyelonephritis  Influenza-like illness  Atypical chest pain   Disposition: Discharge  Condition: Good  I have discussed the results, Dx and Tx plan with the patient who expressed understanding and agree(s) with the plan. Discharge instructions discussed at great length. The patient was given strict return precautions who verbalized understanding of the instructions. No further questions at time of discharge.    ED Discharge Orders         Ordered    cephALEXin (KEFLEX) 500 MG capsule  3 times daily     02/20/18 0420    ondansetron (ZOFRAN ODT) 4 MG disintegrating tablet  Every 8 hours PRN     02/20/18 0420  Follow Up: Primary care provider  Schedule an appointment as soon as possible for a visit  in 5-7 days, If symptoms do not improve or  worsen      This chart was dictated using voice recognition software.  Despite best efforts to proofread,  errors can occur which can change  the documentation meaning.  Fatima Blank, MD 02/20/18 [phone]

## 2018-02-23 NOTE — Telephone Encounter (Signed)
Medication never prescribed by me.  I am not listed as her PCP as of yet.  Unsure about her chronic medical condition that needs this medication.  Needs to establish care with me.  Thanks.

## 2018-02-24 LAB — CULTURE, BLOOD (ROUTINE X 2)
Culture: NO GROWTH
Culture: NO GROWTH

## 2018-03-09 NOTE — Progress Notes (Signed)
HPI: FU coronary artery disease. She is status post coronary artery bypass and graft in 2001. Echocardiogram in March of 2010 showed normal LV function, mild to moderate aortic insufficiency and mild mitral regurgitation. Abdominal CT September 2016 showed no aneurysm.   Cardiac catheterization November 2018 showed 20 to 25% LAD and normal LV function.  LIMA to the LAD occluded proximally.  Echocardiogram ordered at last office visit April 2018 but not performed.  Since last seen,  patient with multiple complaints.  She complains of "not feeling good".  She complains of fatigue, hurting all over.  She has sharp chest pains that are chronic.  She complains of constant dyspnea worse with exertion.  Current Outpatient Medications  Medication Sig Dispense Refill  . ALPRAZolam (XANAX) 0.25 MG tablet TAKE ONE TABLET BY MOUTH DAILY ONLY AS NEEDED FOR ANXIETY 20 tablet 0  . amLODipine (NORVASC) 5 MG tablet Take 1 tablet (5 mg total) by mouth daily. 90 tablet 4  . aspirin 81 MG chewable tablet Chew 1 tablet (81 mg total) by mouth daily. 90 tablet 4  . celecoxib (CELEBREX) 200 MG capsule Take 1 capsule (200 mg total) by mouth daily. 90 capsule 0  . fluticasone (FLONASE) 50 MCG/ACT nasal spray Place 2 sprays into both nostrils daily. 16 g 5  . Multiple Vitamins-Minerals (MULTIVITAMIN WITH MINERALS) tablet Take 1 tablet by mouth daily.    . nitroGLYCERIN (NITROSTAT) 0.4 MG SL tablet Place 1 tablet (0.4 mg total) under the tongue every 5 (five) minutes x 3 doses as needed for chest pain. 25 tablet 1  . rosuvastatin (CRESTOR) 20 MG tablet Take 1 tablet (20 mg total) by mouth daily. 90 tablet 3  . triamcinolone cream (KENALOG) 0.5 % Apply 1 application topically 2 (two) times daily. To fingers as needed for dry skin (Patient taking differently: Apply 1 application topically 2 (two) times daily as needed (for dry skin). ) 45 g 0   No current facility-administered medications for this visit.      Past  Medical History:  Diagnosis Date  . Aortic insufficiency    a. Mild-mod AI by echo 2010.  . Breast mass 05/17/2016   dr feels lump inner right quad pt cannot feel lump  . COPD   . CORONARY ARTERY DISEASE    a. CABG (LIMA-->LAD 2001). b. Myoview 2008 no ischemia, EF 63%.  c. cath 10/14/2013 mild 20% ost LAD, atretic LIMA, EF 55%, medical therapy  . HYPERLIPIDEMIA   . HYPERTENSION   . MENORRHAGIA     Past Surgical History:  Procedure Laterality Date  . arthroscopic knee surgery    . CESAREAN SECTION    . CORONARY ARTERY BYPASS GRAFT    . LEFT HEART CATH AND CORONARY ANGIOGRAPHY N/A 01/17/2017   Procedure: LEFT HEART CATH AND CORONARY ANGIOGRAPHY;  Surgeon: Belva Crome, MD;  Location: [place] [facility];  Service: Cardiovascular;  Laterality: N/A;  . LEFT HEART CATHETERIZATION WITH CORONARY/GRAFT ANGIOGRAM N/A 10/12/2013   Procedure: LEFT HEART CATHETERIZATION WITH Beatrix Fetters;  Surgeon: Troy Sine, MD;  Location: [facility] CATH LAB;  Service: Cardiovascular;  Laterality: N/A;    Social History   Socioeconomic History  . Marital status: Married    Spouse name: Not on file  . Number of children: Not on file  . Years of education: Not on file  . Highest education level: Not on file  Occupational History  . Not on file  Social Needs  . Emergency planning/management officer  strain: Not on file  . Food insecurity:    Worry: Not on file    Inability: Not on file  . Transportation needs:    Medical: Not on file    Non-medical: Not on file  Tobacco Use  . Smoking status: Former Smoker    Last attempt to quit: 1980    Years since quitting: 40.0  . Smokeless tobacco: Never Used  . Tobacco comment: QUIT SMOKING IN 1999  Substance and Sexual Activity  . Alcohol use: Yes    Comment: friday nights - 2 glasses - rare  . Drug use: No    Comment:  quit using in 1985  . Sexual activity: Yes    Partners: Male  Lifestyle  . Physical activity:    Days per week: Not on file    Minutes per  session: Not on file  . Stress: Not on file  Relationships  . Social connections:    Talks on phone: Not on file    Gets together: Not on file    Attends religious service: Not on file    Active member of club or organization: Not on file    Attends meetings of clubs or organizations: Not on file    Relationship status: Not on file  . Intimate partner violence:    Fear of current or ex partner: Not on file    Emotionally abused: Not on file    Physically abused: Not on file    Forced sexual activity: Not on file  Other Topics Concern  . Not on file  Social History Narrative   Marital status: married   Children: 3 chidlren and 16 grandchildren and 1 great grandchild   Lives with: husband   Employment: customer service [name]   Tobacco:  quit   Alcohol:  Friday nights   Drugs:  none   Exercise:  no   Seatbelt: 100%   Guns in home: rifle       Secured: yes    Family History  Problem Relation Age of Onset  . Heart disease Mother   . Heart disease Sister   . Cancer Sister     ROS: Fatigue, hurting all over, dyspnea but no fevers or chills, productive cough, hemoptysis, dysphasia, odynophagia, melena, hematochezia, dysuria, hematuria, rash, seizure activity, orthopnea, PND, pedal edema, claudication. Remaining systems are negative.  Physical Exam: Well-developed well-nourished in no acute distress.  Skin is warm and dry.  HEENT is normal.  Neck is supple.  Chest is clear to auscultation with normal expansion.  Cardiovascular exam is regular rate and rhythm.  Abdominal exam nontender or distended. No masses palpated. Extremities show no edema. neuro grossly intact  ECG-normal sinus rhythm, nonspecific ST changes.  Unchanged compared to February 20, 2018.  Personally reviewed  A/P  1 coronary artery disease-continue aspirin and statin.  Patient describes sharp chest pains which are longstanding.  Electrocardiogram unchanged.  Last catheterization as outlined in HPI  and medical therapy recommended.  No further ischemia evaluation.  Patient repeatedly stating she is tired and does not want to work.  Stating she needs disability.  2 history of mild to moderate aortic insufficiency-we will arrange repeat echocardiogram.  3 hyperlipidemia-continue statin.  Recent liver functions normal.  Check lipids.  4 hypertension-patient's blood pressure is controlled.  Continue present medications and follow.  5 fatigue-check TSH.  Recent hemoglobin 12.5.  Kirk Ruths, MD

## 2018-03-11 ENCOUNTER — Other Ambulatory Visit: Payer: Self-pay | Admitting: Emergency Medicine

## 2018-03-11 DIAGNOSIS — F430 Acute stress reaction: Secondary | ICD-10-CM

## 2018-03-12 ENCOUNTER — Ambulatory Visit: Payer: Managed Care, Other (non HMO) | Admitting: Cardiology

## 2018-03-12 ENCOUNTER — Encounter: Payer: Self-pay | Admitting: Cardiology

## 2018-03-12 VITALS — BP 124/60 | HR 67 | Ht 62.0 in | Wt 134.0 lb

## 2018-03-12 DIAGNOSIS — R0789 Other chest pain: Secondary | ICD-10-CM | POA: Diagnosis not present

## 2018-03-12 DIAGNOSIS — I10 Essential (primary) hypertension: Secondary | ICD-10-CM

## 2018-03-12 DIAGNOSIS — I2510 Atherosclerotic heart disease of native coronary artery without angina pectoris: Secondary | ICD-10-CM | POA: Diagnosis not present

## 2018-03-12 DIAGNOSIS — E7800 Pure hypercholesterolemia, unspecified: Secondary | ICD-10-CM

## 2018-03-12 DIAGNOSIS — I351 Nonrheumatic aortic (valve) insufficiency: Secondary | ICD-10-CM

## 2018-03-12 LAB — LIPID PANEL
Chol/HDL Ratio: 1.6 ratio (ref 0.0–4.4)
Cholesterol, Total: 109 mg/dL (ref 100–199)
HDL: 67 mg/dL (ref >39)
LDL Calculated: 27 mg/dL (ref 0–99)
Triglycerides: 77 mg/dL (ref 0–149)
VLDL CHOLESTEROL CAL: 15 mg/dL (ref 5–40)

## 2018-03-12 LAB — TSH: TSH: 1.58 u[IU]/mL (ref 0.450–4.500)

## 2018-03-12 NOTE — Telephone Encounter (Signed)
Patient is seeing her PCP Casey Huerta at [facility] Medication denied

## 2018-03-12 NOTE — Telephone Encounter (Signed)
Patient is requesting a refill of the following medications: Requested Prescriptions   Pending Prescriptions Disp Refills  . ALPRAZolam (XANAX) 0.25 MG tablet [Pharmacy Med Name: ALPRAZolam 0.25 MG TABLET] 20 tablet 0    Sig: TAKE ONE TABLET BY MOUTH DAILY ONLY AS NEEDED FOR ANXIETY    Date of patient request: 03/11/2017 Last office visit: 01/29/2018 Date of last refill: 01/30/2018 Last refill amount: 20 Follow up time period per chart:

## 2018-03-12 NOTE — Patient Instructions (Signed)
Medication Instructions:  Continue same medications If you need a refill on your cardiac medications before your next appointment, please call your pharmacy.   Lab work: Lipid panel and TSH today If you have labs (blood work) drawn today and your tests are completely normal, you will receive your results only by: Marland Kitchen MyChart Message (if you have MyChart) OR . A paper copy in the mail If you have any lab test that is abnormal or we need to change your treatment, we will call you to review the results.  Testing/Procedures: Schedule Echo  Follow-Up: At [facility], you and your health needs are our priority.  As part of our continuing mission to provide you with exceptional heart care, we have created designated Provider Care Teams.  These Care Teams include your primary Cardiologist (physician) and Advanced Practice Providers (APPs -  Physician Assistants and Nurse Practitioners) who all work together to provide you with the care you need, when you need it. . Follow Up with Dr.Crenshaw in 6 months call 2 months before to schedule

## 2018-03-13 ENCOUNTER — Encounter: Payer: Self-pay | Admitting: *Deleted

## 2018-03-13 ENCOUNTER — Other Ambulatory Visit: Payer: Self-pay | Admitting: Emergency Medicine

## 2018-03-13 DIAGNOSIS — F430 Acute stress reaction: Secondary | ICD-10-CM

## 2018-03-16 ENCOUNTER — Other Ambulatory Visit: Payer: Self-pay | Admitting: Emergency Medicine

## 2018-03-16 ENCOUNTER — Other Ambulatory Visit (HOSPITAL_COMMUNITY): Payer: Managed Care, Other (non HMO)

## 2018-03-16 DIAGNOSIS — F430 Acute stress reaction: Secondary | ICD-10-CM

## 2018-03-16 DIAGNOSIS — R0989 Other specified symptoms and signs involving the circulatory and respiratory systems: Secondary | ICD-10-CM

## 2018-03-19 NOTE — Telephone Encounter (Signed)
pls advise seen by you on 12/16/17

## 2018-03-26 ENCOUNTER — Encounter: Payer: Self-pay | Admitting: Cardiology

## 2018-03-31 ENCOUNTER — Telehealth: Payer: Self-pay

## 2018-03-31 NOTE — Telephone Encounter (Signed)
New message    Just an FYI. We have made several attempts to contact this patient including sending a letter to schedule or reschedule their echocardiogram. We will be removing the patient from the echo WQ.   Thank you 

## 2018-05-04 ENCOUNTER — Other Ambulatory Visit: Payer: Self-pay | Admitting: Emergency Medicine

## 2018-05-04 DIAGNOSIS — F430 Acute stress reaction: Secondary | ICD-10-CM

## 2018-05-04 NOTE — Telephone Encounter (Signed)
Requested medication (s) are due for refill today: yes  Requested medication (s) are on the active medication list: yes    Last refill: 03/20/2018  #20 0 refills  Future visit scheduled no  Notes to clinic:Not delegated  Requested Prescriptions  Pending Prescriptions Disp Refills   ALPRAZolam (XANAX) 0.25 MG tablet [Pharmacy Med Name: ALPRAZolam 0.25 MG TABLET] 20 tablet 0    Sig: TAKE ONE TABLET BY MOUTH DAILY ONLY AS NEEDED FOR ANXIETY     Not Delegated - Psychiatry:  Anxiolytics/Hypnotics Failed - 05/04/2018  9:16 AM      Failed - This refill cannot be delegated      Failed - Urine Drug Screen completed in last 360 days.      Passed - Valid encounter within last 6 months    Recent Outpatient Visits          4 months ago Flu-like symptoms   Primary Care at [facility], [place], MD   12 months ago Insomnia, unspecified type   Primary Care at [place], PA-C   1 year ago Annual physical exam   Primary Care at [place], PA-C   1 year ago Acute pain of left knee   [facility] at [facility], Damaris Hippo, PA-C   2 years ago Annual physical exam   Primary Care at [place], PA-C

## 2018-05-05 NOTE — Telephone Encounter (Signed)
Pt is going out of town and uses during travel. Please send in today 3/17.  [name] Friendly [address] [place]

## 2018-05-05 NOTE — Telephone Encounter (Signed)
Patient is requesting a refill of the following medications: Requested Prescriptions   Pending Prescriptions Disp Refills  . ALPRAZolam (XANAX) 0.25 MG tablet [Pharmacy Med Name: ALPRAZolam 0.25 MG TABLET] 20 tablet 0    Sig: TAKE ONE TABLET BY MOUTH DAILY ONLY AS NEEDED FOR ANXIETY    Date of patient request: 05/03/2018 Last office visit: 12/16/2017 Date of last refill: 03/20/2018 Last refill amount: 20 Follow up time period per chart:

## 2018-05-15 ENCOUNTER — Other Ambulatory Visit: Payer: Self-pay | Admitting: Emergency Medicine

## 2018-05-15 DIAGNOSIS — F430 Acute stress reaction: Secondary | ICD-10-CM

## 2018-05-15 NOTE — Telephone Encounter (Signed)
Requested medication (s) are due for refill today: no  Requested medication (s) are on the active medication list: yes  Last refill:  05/05/18  Future visit scheduled: no  Notes to clinic:  Too early for refill- this medication not delegated to NT to refill   Requested Prescriptions  Pending Prescriptions Disp Refills   ALPRAZolam (XANAX) 0.25 MG tablet [Pharmacy Med Name: ALPRAZolam 0.25 MG TABLET] 20 tablet 0    Sig: TAKE ONE TABLET BY MOUTH DAILY AS NEEDED FOR ANXIETY     Not Delegated - Psychiatry:  Anxiolytics/Hypnotics Failed - 05/15/2018  1:55 PM      Failed - This refill cannot be delegated      Failed - Urine Drug Screen completed in last 360 days.      Passed - Valid encounter within last 6 months    Recent Outpatient Visits          5 months ago Flu-like symptoms   Primary Care at [facility], Ines Bloomer, MD   1 year ago Insomnia, unspecified type   Primary Care at [place], PA-C   1 year ago Annual physical exam   Primary Care at [place], PA-C   1 year ago Acute pain of left knee   [facility], Damaris Hippo, PA-C   2 years ago Annual physical exam   Primary Care at [place], PA-C

## 2018-05-19 ENCOUNTER — Encounter: Payer: Self-pay | Admitting: Emergency Medicine

## 2018-05-19 ENCOUNTER — Telehealth: Payer: Self-pay | Admitting: Emergency Medicine

## 2018-05-19 NOTE — Telephone Encounter (Signed)
Patient states she is out of her medication  Casey Huerta on [employer]

## 2018-05-20 ENCOUNTER — Other Ambulatory Visit: Payer: Self-pay | Admitting: *Deleted

## 2018-05-20 ENCOUNTER — Other Ambulatory Visit: Payer: Self-pay | Admitting: Emergency Medicine

## 2018-05-20 DIAGNOSIS — E7800 Pure hypercholesterolemia, unspecified: Secondary | ICD-10-CM

## 2018-05-20 DIAGNOSIS — F430 Acute stress reaction: Secondary | ICD-10-CM

## 2018-05-20 MED ORDER — ALPRAZOLAM 0.25 MG PO TABS: 0.2500 mg | ORAL_TABLET | Freq: Every day | ORAL | 0 refills | Status: DC | PRN

## 2018-05-20 NOTE — Telephone Encounter (Signed)
Xanax prescription sent.

## 2018-05-27 ENCOUNTER — Telehealth: Payer: Managed Care, Other (non HMO) | Admitting: Family Medicine

## 2018-05-28 ENCOUNTER — Ambulatory Visit: Payer: Managed Care, Other (non HMO) | Admitting: Emergency Medicine

## 2018-07-02 ENCOUNTER — Other Ambulatory Visit: Payer: Self-pay | Admitting: Emergency Medicine

## 2018-07-02 DIAGNOSIS — F430 Acute stress reaction: Secondary | ICD-10-CM

## 2018-07-02 NOTE — Telephone Encounter (Signed)
Requested medication (s) are due for refill today: yes  Requested medication (s) are on the active medication list: yes    Last refill: 05/20/2018  #20   0 refills  Future visit scheduled  no  Notes to clinic:not delegated  Requested Prescriptions  Pending Prescriptions Disp Refills   ALPRAZolam (XANAX) 0.25 MG tablet [Pharmacy Med Name: ALPRAZolam 0.25 MG TABLET] 20 tablet 0    Sig: TAKE ONE TABLET BY MOUTH DAILY AS NEEDED FOR ANXIETY     Not Delegated - Psychiatry:  Anxiolytics/Hypnotics Failed - 07/02/2018  4:54 PM      Failed - This refill cannot be delegated      Failed - Urine Drug Screen completed in last 360 days.      Failed - Valid encounter within last 6 months    Recent Outpatient Visits          6 months ago Flu-like symptoms   Primary Care at [facility], Ines Bloomer, MD   1 year ago Insomnia, unspecified type   Primary Care at [place], PA-C   1 year ago Annual physical exam   Primary Care at [place], PA-C   2 years ago Acute pain of left knee   [facility], Damaris Hippo, PA-C   2 years ago Annual physical exam   Primary Care at [place], PA-C

## 2018-07-21 ENCOUNTER — Encounter: Payer: Self-pay | Admitting: Emergency Medicine

## 2018-07-22 ENCOUNTER — Encounter: Payer: Self-pay | Admitting: Emergency Medicine

## 2018-08-25 ENCOUNTER — Telehealth: Payer: Self-pay | Admitting: *Deleted

## 2018-08-25 NOTE — Telephone Encounter (Signed)
A message was left, re: follow up visit. 

## 2018-09-19 DIAGNOSIS — K920 Hematemesis: Secondary | ICD-10-CM

## 2018-09-19 HISTORY — DX: Hematemesis: K92.0

## 2018-09-22 ENCOUNTER — Other Ambulatory Visit: Payer: Self-pay

## 2018-09-22 ENCOUNTER — Encounter (HOSPITAL_COMMUNITY): Payer: Self-pay | Admitting: Emergency Medicine

## 2018-09-22 ENCOUNTER — Inpatient Hospital Stay (HOSPITAL_COMMUNITY)
Admission: EM | Admit: 2018-09-22 | Discharge: 2018-09-24 | DRG: 378 | Disposition: A | Discharge status: Home / Self Care | Payer: Managed Care, Other (non HMO) | Attending: Internal Medicine | Admitting: Internal Medicine

## 2018-09-22 DIAGNOSIS — Z87891 Personal history of nicotine dependence: Secondary | ICD-10-CM

## 2018-09-22 DIAGNOSIS — I2510 Atherosclerotic heart disease of native coronary artery without angina pectoris: Secondary | ICD-10-CM | POA: Diagnosis present

## 2018-09-22 DIAGNOSIS — I10 Essential (primary) hypertension: Secondary | ICD-10-CM | POA: Diagnosis present

## 2018-09-22 DIAGNOSIS — E785 Hyperlipidemia, unspecified: Secondary | ICD-10-CM | POA: Diagnosis present

## 2018-09-22 DIAGNOSIS — Z8041 Family history of malignant neoplasm of ovary: Secondary | ICD-10-CM | POA: Diagnosis not present

## 2018-09-22 DIAGNOSIS — D509 Iron deficiency anemia, unspecified: Secondary | ICD-10-CM | POA: Diagnosis present

## 2018-09-22 DIAGNOSIS — Z8249 Family history of ischemic heart disease and other diseases of the circulatory system: Secondary | ICD-10-CM | POA: Diagnosis not present

## 2018-09-22 DIAGNOSIS — Z7982 Long term (current) use of aspirin: Secondary | ICD-10-CM | POA: Diagnosis not present

## 2018-09-22 DIAGNOSIS — K922 Gastrointestinal hemorrhage, unspecified: Secondary | ICD-10-CM | POA: Diagnosis present

## 2018-09-22 DIAGNOSIS — K254 Chronic or unspecified gastric ulcer with hemorrhage: Secondary | ICD-10-CM | POA: Diagnosis present

## 2018-09-22 DIAGNOSIS — F419 Anxiety disorder, unspecified: Secondary | ICD-10-CM | POA: Diagnosis present

## 2018-09-22 DIAGNOSIS — Z951 Presence of aortocoronary bypass graft: Secondary | ICD-10-CM | POA: Diagnosis not present

## 2018-09-22 DIAGNOSIS — K920 Hematemesis: Secondary | ICD-10-CM

## 2018-09-22 DIAGNOSIS — D62 Acute posthemorrhagic anemia: Secondary | ICD-10-CM | POA: Diagnosis present

## 2018-09-22 DIAGNOSIS — Z20828 Contact with and (suspected) exposure to other viral communicable diseases: Secondary | ICD-10-CM | POA: Diagnosis present

## 2018-09-22 LAB — CBC
HCT: 33 % — ABNORMAL LOW (ref 36.0–46.0)
Hemoglobin: 10.7 g/dL — ABNORMAL LOW (ref 12.0–15.0)
MCH: 29.6 pg (ref 26.0–34.0)
MCHC: 32.4 g/dL (ref 30.0–36.0)
MCV: 91.2 fL (ref 80.0–100.0)
Platelets: 225 10*3/uL (ref 150–400)
RBC: 3.62 MIL/uL — ABNORMAL LOW (ref 3.87–5.11)
RDW: 11.9 % (ref 11.5–15.5)
WBC: 6.1 10*3/uL (ref 4.0–10.5)
nRBC: 0 % (ref 0.0–0.2)

## 2018-09-22 LAB — COMPREHENSIVE METABOLIC PANEL
ALT: 19 U/L (ref 0–44)
AST: 20 U/L (ref 15–41)
Albumin: 3.6 g/dL (ref 3.5–5.0)
Alkaline Phosphatase: 58 U/L (ref 38–126)
Anion gap: 9 (ref 5–15)
BUN: 24 mg/dL — ABNORMAL HIGH (ref 8–23)
CO2: 26 mmol/L (ref 22–32)
Calcium: 10.1 mg/dL (ref 8.9–10.3)
Chloride: 106 mmol/L (ref 98–111)
Creatinine, Ser: 1.03 mg/dL — ABNORMAL HIGH (ref 0.44–1.00)
GFR calc Af Amer: >60 mL/min (ref >60)
GFR calc non Af Amer: 59 mL/min — ABNORMAL LOW (ref >60)
Glucose, Bld: 132 mg/dL — ABNORMAL HIGH (ref 70–99)
Potassium: 3.7 mmol/L (ref 3.5–5.1)
Sodium: 141 mmol/L (ref 135–145)
Total Bilirubin: 0.6 mg/dL (ref 0.3–1.2)
Total Protein: 6.5 g/dL (ref 6.5–8.1)

## 2018-09-22 LAB — PROTIME-INR
INR: 1.1 (ref 0.8–1.2)
Prothrombin Time: 13.8 seconds (ref 11.4–15.2)

## 2018-09-22 LAB — POC OCCULT BLOOD, ED: Fecal Occult Bld: POSITIVE — AB

## 2018-09-22 LAB — TYPE AND SCREEN
ABO/RH(D): A POS
Antibody Screen: NEGATIVE

## 2018-09-22 LAB — ABO/RH: ABO/RH(D): A POS

## 2018-09-22 LAB — HEMOGLOBIN AND HEMATOCRIT, BLOOD
HCT: 32.4 % — ABNORMAL LOW (ref 36.0–46.0)
Hemoglobin: 10.6 g/dL — ABNORMAL LOW (ref 12.0–15.0)

## 2018-09-22 MED ORDER — ONDANSETRON HCL 4 MG/2ML IJ SOLN
4.0000 mg | Freq: Once | INTRAMUSCULAR | Status: AC
  Administered 2018-09-22: 4 mg via INTRAVENOUS
  Filled 2018-09-22: qty 2

## 2018-09-22 MED ORDER — ONDANSETRON HCL 4 MG/2ML IJ SOLN: 4.0000 mg | Freq: Four times a day (QID) | INTRAMUSCULAR | Status: DC | PRN

## 2018-09-22 MED ORDER — PANTOPRAZOLE SODIUM 40 MG IV SOLR: 40.0000 mg | Freq: Two times a day (BID) | INTRAVENOUS | Status: DC

## 2018-09-22 MED ORDER — ADULT MULTIVITAMIN W/MINERALS CH
1.0000 | ORAL_TABLET | Freq: Every day | ORAL | Status: DC
  Administered 2018-09-22 – 2018-09-24 (×2): 1 via ORAL
  Filled 2018-09-22 (×4): qty 1

## 2018-09-22 MED ORDER — ACETAMINOPHEN 650 MG RE SUPP: 650.0000 mg | Freq: Four times a day (QID) | RECTAL | Status: DC | PRN

## 2018-09-22 MED ORDER — SODIUM CHLORIDE 0.9 % IV SOLN
INTRAVENOUS | Status: DC
  Administered 2018-09-22 – 2018-09-24 (×3): via INTRAVENOUS

## 2018-09-22 MED ORDER — PANTOPRAZOLE SODIUM 40 MG IV SOLR
40.0000 mg | Freq: Once | INTRAVENOUS | Status: AC
  Administered 2018-09-22: 40 mg via INTRAVENOUS
  Filled 2018-09-22: qty 40

## 2018-09-22 MED ORDER — TRAMADOL HCL 50 MG PO TABS
50.0000 mg | ORAL_TABLET | Freq: Every day | ORAL | Status: DC
  Administered 2018-09-22 – 2018-09-23 (×2): 50 mg via ORAL
  Filled 2018-09-22 (×2): qty 1

## 2018-09-22 MED ORDER — FLUTICASONE PROPIONATE 50 MCG/ACT NA SUSP
2.0000 | Freq: Every day | NASAL | Status: DC
  Administered 2018-09-24: 2 via NASAL
  Filled 2018-09-22: qty 16

## 2018-09-22 MED ORDER — ACETAMINOPHEN 325 MG PO TABS
650.0000 mg | ORAL_TABLET | Freq: Four times a day (QID) | ORAL | Status: DC | PRN
  Administered 2018-09-24: 650 mg via ORAL
  Filled 2018-09-22: qty 2

## 2018-09-22 MED ORDER — ALPRAZOLAM 0.25 MG PO TABS: 0.2500 mg | ORAL_TABLET | Freq: Every day | ORAL | Status: DC | PRN

## 2018-09-22 MED ORDER — ROSUVASTATIN CALCIUM 20 MG PO TABS
20.0000 mg | ORAL_TABLET | Freq: Every day | ORAL | Status: DC
  Administered 2018-09-22 – 2018-09-24 (×3): 20 mg via ORAL
  Filled 2018-09-22 (×4): qty 1

## 2018-09-22 MED ORDER — ONDANSETRON HCL 4 MG PO TABS: 4.0000 mg | ORAL_TABLET | Freq: Four times a day (QID) | ORAL | Status: DC | PRN

## 2018-09-22 MED ORDER — SODIUM CHLORIDE 0.9 % IV SOLN
8.0000 mg/h | INTRAVENOUS | Status: DC
  Administered 2018-09-22 – 2018-09-23 (×3): 8 mg/h via INTRAVENOUS
  Filled 2018-09-22 (×5): qty 80

## 2018-09-22 NOTE — Plan of Care (Signed)
  Problem: Pain Managment: Goal: General experience of comfort will improve Outcome: Progressing   Problem: Safety: Goal: Ability to remain free from injury will improve Outcome: Progressing   

## 2018-09-22 NOTE — H&P (Signed)
History and Physical    Casey Huerta VQQ:[id number] DOB: 1955-08-26 DOA: 09/22/2018  PCP: Mancel Bale, PA-C  Patient coming from: Home  I have personally briefly reviewed patient's old medical records in [place]  Chief Complaint: Hematemesis  HPI: Casey Huerta is a 63 y.o. female with medical history significant of CAD s/p CABG, HTN, HLD, mild-mod AI.  Patient presents to ED with c/o hematemesis and melena.  Symptoms onset yesterday, severe emesis with significant blood.  Associated black tarry and loose stools.  H/o taking celebrex for ~ the past year, this was changed to Mobic recently.  Only blood thinner is ASA 81 daily.  No fevers, chills, SOB.  Is starting to have very mild epigastric abdominal pain.  ED Course: No additional emesis in the ED.  Hemoccult positive on PR exam with gross melena on exam.  HGB 10.7 (12.5 in Jan).   Review of Systems: As per HPI, otherwise all review of systems negative.  Past Medical History:  Diagnosis Date  . Aortic insufficiency    a. Mild-mod AI by echo 2010.  . Breast mass 05/17/2016   BIRADS 3: biopsy revealed benign fibroadenoma  . COPD   . CORONARY ARTERY DISEASE    a. CABG (LIMA-->LAD 2001). b. Myoview 2008 no ischemia, EF 63%.  c. cath 10/14/2013 mild 20% ost LAD, atretic LIMA, EF 55%, medical therapy  . HYPERLIPIDEMIA   . HYPERTENSION   . MENORRHAGIA     Past Surgical History:  Procedure Laterality Date  . arthroscopic knee surgery    . CESAREAN SECTION    . CORONARY ARTERY BYPASS GRAFT    . LEFT HEART CATH AND CORONARY ANGIOGRAPHY N/A 01/17/2017   Procedure: LEFT HEART CATH AND CORONARY ANGIOGRAPHY;  Surgeon: Belva Crome, MD;  Location: [place] CV LAB;  Service: Cardiovascular;  Laterality: N/A;  . LEFT HEART CATHETERIZATION WITH CORONARY/GRAFT ANGIOGRAM N/A 10/12/2013   Procedure: LEFT HEART CATHETERIZATION WITH Beatrix Fetters;  Surgeon: Troy Sine, MD;  Location: [facility] CATH LAB;   Service: Cardiovascular;  Laterality: N/A;     reports that she quit smoking about 40 years ago. She has never used smokeless tobacco. She reports current alcohol use. She reports that she does not use drugs.  Allergies  Allergen Reactions  . Simvastatin Other (See Comments)    REACTION: constipation    Family History  Problem Relation Age of Onset  . Heart disease Mother   . Heart disease Sister   . Cancer Sister      Prior to Admission medications   Medication Sig Start Date End Date Taking? Authorizing Provider  ALPRAZolam (XANAX) 0.25 MG tablet Take 1 tablet (0.25 mg total) by mouth daily as needed for anxiety. 05/20/18  Yes Sagardia, Ines Bloomer, MD  amLODipine (NORVASC) 5 MG tablet Take 1 tablet (5 mg total) by mouth daily. 05/06/17  Yes Weber, Damaris Hippo, PA-C  aspirin 81 MG chewable tablet Chew 1 tablet (81 mg total) by mouth daily. 01/19/17  Yes Duke, Tami Lin, PA  fluticasone (FLONASE) 50 MCG/ACT nasal spray Place 2 sprays into both nostrils daily. 05/06/17  Yes Weber, Damaris Hippo, PA-C  meloxicam (MOBIC) 7.5 MG tablet Take 7.5 mg by mouth 2 (two) times daily. 07/06/18  Yes [provider]  Multiple Vitamins-Minerals (MULTIVITAMIN WITH MINERALS) tablet Take 1 tablet by mouth daily.   Yes [provider]  nitroGLYCERIN (NITROSTAT) 0.4 MG SL tablet Place 1 tablet (0.4 mg total) under the tongue every 5 (  five) minutes x 3 doses as needed for chest pain. 01/20/18  Yes Lelon Perla, MD  rosuvastatin (CRESTOR) 20 MG tablet Take 1 tablet (20 mg total) by mouth daily. 03/25/17  Yes Weber, Sarah L, PA-C  traMADol (ULTRAM) 50 MG tablet Take 50 mg by mouth at bedtime. Do not take with Xanax! 07/20/18  Yes [provider]  triamcinolone cream (KENALOG) 0.5 % Apply 1 application topically 2 (two) times daily. To fingers as needed for dry skin Patient taking differently: Apply 1 application topically 2 (two) times daily as needed (for dry skin).  01/20/16  Yes Mancel Bale, PA-C    Physical Exam: Vitals:   09/22/18 1745 09/22/18 1800 09/22/18 1945 09/22/18 1952  BP: 114/76 110/61  (!) 121/52  Pulse: 85 78 82 80  Resp: 19 15 18 18   Temp:      TempSrc:      SpO2: (!) 80% 100% 100% 100%  Weight:      Height:        Constitutional: NAD, calm, comfortable Eyes: PERRL, lids and conjunctivae normal ENMT: Mucous membranes are moist. Posterior pharynx clear of any exudate or lesions.Normal dentition.  Neck: normal, supple, no masses, no thyromegaly Respiratory: clear to auscultation bilaterally, no wheezing, no crackles. Normal respiratory effort. No accessory muscle use.  Cardiovascular: Regular rate and rhythm, no murmurs / rubs / gallops. No extremity edema. 2+ pedal pulses. No carotid bruits.  Abdomen: no tenderness, no masses palpated. No hepatosplenomegaly. Bowel sounds positive.  Musculoskeletal: no clubbing / cyanosis. No joint deformity upper and lower extremities. Good ROM, no contractures. Normal muscle tone.  Skin: no rashes, lesions, ulcers. No induration Neurologic: CN 2-12 grossly intact. Sensation intact, DTR normal. Strength 5/5 in all 4.  Psychiatric: Normal judgment and insight. Alert and oriented x 3. Normal mood.    Labs on Admission: I have personally reviewed following labs and imaging studies  CBC: Recent Labs  Lab 09/22/18 1310  WBC 6.1  HGB 10.7*  HCT 33.0*  MCV 91.2  PLT 027   Basic Metabolic Panel: Recent Labs  Lab 09/22/18 1310  NA 141  K 3.7  CL 106  CO2 26  GLUCOSE 132*  BUN 24*  CREATININE 1.03*  CALCIUM 10.1   GFR: Estimated Creatinine Clearance: 50.3 mL/min (A) (by C-G formula based on SCr of 1.03 mg/dL (H)). Liver Function Tests: Recent Labs  Lab 09/22/18 1310  AST 20  ALT 19  ALKPHOS 58  BILITOT 0.6  PROT 6.5  ALBUMIN 3.6   No results for input(s): LIPASE, AMYLASE in the last 168 hours. No results for input(s): AMMONIA in the last 168 hours. Coagulation Profile: Recent Labs  Lab  09/22/18 1310  INR 1.1   Cardiac Enzymes: No results for input(s): CKTOTAL, CKMB, CKMBINDEX, TROPONINI in the last 168 hours. BNP (last 3 results) No results for input(s): PROBNP in the last 8760 hours. HbA1C: No results for input(s): HGBA1C in the last 72 hours. CBG: No results for input(s): GLUCAP in the last 168 hours. Lipid Profile: No results for input(s): CHOL, HDL, LDLCALC, TRIG, CHOLHDL, LDLDIRECT in the last 72 hours. Thyroid Function Tests: No results for input(s): TSH, T4TOTAL, FREET4, T3FREE, THYROIDAB in the last 72 hours. Anemia Panel: No results for input(s): VITAMINB12, FOLATE, FERRITIN, TIBC, IRON, RETICCTPCT in the last 72 hours. Urine analysis:    Component Value Date/Time   COLORURINE YELLOW 02/19/2018 2028   APPEARANCEUR CLEAR 02/19/2018 2028   LABSPEC 1.010 02/19/2018 2028  PHURINE 6.0 02/19/2018 2028   GLUCOSEU NEGATIVE 02/19/2018 2028   GLUCOSEU NEGATIVE 03/07/2006 1532   HGBUR MODERATE (A) 02/19/2018 2028   BILIRUBINUR NEGATIVE 02/19/2018 2028   BILIRUBINUR neg 11/07/2014 [place] 02/19/2018 2028   PROTEINUR NEGATIVE 02/19/2018 2028   UROBILINOGEN 0.2 11/07/2014 1030   UROBILINOGEN 1.0 01/28/2009 2220   NITRITE POSITIVE (A) 02/19/2018 2028   LEUKOCYTESUR TRACE (A) 02/19/2018 2028    Radiological Exams on Admission: No results found.  EKG: Independently reviewed.  Assessment/Plan Principal Problem:   UGIB (upper gastrointestinal bleed) Active Problems:   Essential hypertension   Hx of CABG    1. UGIB - 1. Clear liquid diet until MN then NPO after MN 2. Call GI in AM presumably for EGD 3. Hold ASA, Hold Mobic 4. PPI bolus and GTT 5. Repeat H/H at MN and CBC at 0500 6. Type and screen done 7. Tele monitor 1. No current tachycardia, hypotension, or other suggestion of pending hemorrhagic shock 8. IVF: NS at 75 2. HTN - 1. Hold amlodipine 3. H/o CABG - 1. Cont statin 2. Hold ASA  DVT prophylaxis: SCDs Code  Status: Full Family Communication: No family in room Disposition Plan: Home after admit Consults called: None, call GI in AM Admission status: Admit to inpatient  Severity of Illness: The appropriate patient status for this patient is INPATIENT. Inpatient status is judged to be reasonable and necessary in order to provide the required intensity of service to ensure the patient's safety. The patient's presenting symptoms, physical exam findings, and initial radiographic and laboratory data in the context of their chronic comorbidities is felt to place them at high risk for further clinical deterioration. Furthermore, it is not anticipated that the patient will be medically stable for discharge from the hospital within 2 midnights of admission. The following factors support the patient status of inpatient.   IP status for significant UGIB, likely to require EGD.   * I certify that at the point of admission it is my clinical judgment that the patient will require inpatient hospital care spanning beyond 2 midnights from the point of admission due to high intensity of service, high risk for further deterioration and high frequency of surveillance required.*    GARDNER, JARED M. DO [facility]  How to contact the [facility] Attending or Consulting provider [place] or covering provider during after hours [place], for this patient?  1. Check the care team in [facility] and look for a) attending/consulting TR[facility] provider listed and b) the [facility] team listed 2. Log into [email or web address]  Amion Physician Scheduling and messaging for groups and whole hospitals  On call and physician scheduling software for group practices, residents, hospitalists and other medical providers for call, clinic, rotation and shift schedules. OnCall [employer] is a hospital-wide system for scheduling doctors and paging doctors on call. EasyPlot is for scientific plotting and data analysis.  [email or web address]  and use [place]'s universal  password to access. If you do not have the password, please contact the hospital operator.  3. Locate the [facility] provider you are looking for under [facility] and page to a number that you can be directly reached. 4. If you still have difficulty reaching the provider, please page the [facility] (Director on Call) for the Hospitalists listed on amion for assistance.  09/22/2018, 8:07 PM

## 2018-09-22 NOTE — ED Notes (Signed)
Called pt for vitals no answer 

## 2018-09-22 NOTE — ED Provider Notes (Signed)
[facility] EMERGENCY DEPARTMENT Provider Note   CSN: [id number] Arrival date & time: 09/22/18  1207     History   Chief Complaint Chief Complaint  Patient presents with  . Hematemesis  . GI Bleeding    HPI Casey Huerta is a 63 y.o. female.  Presents emerged department with a chief complaint of hematemesis and dark stools.  Patient states since yesterday she has been having severe emesis with significant blood.  States at home.  Large cup full of frank blood.     HPI  Past Medical History:  Diagnosis Date  . Aortic insufficiency    a. Mild-mod AI by echo 2010.  . Breast mass 05/17/2016   dr feels lump inner right quad pt cannot feel lump  . COPD   . CORONARY ARTERY DISEASE    a. CABG (LIMA-->LAD 2001). b. Myoview 2008 no ischemia, EF 63%.  c. cath 10/14/2013 mild 20% ost LAD, atretic LIMA, EF 55%, medical therapy  . HYPERLIPIDEMIA   . HYPERTENSION   . MENORRHAGIA     Patient Active Problem List   Diagnosis Date Noted  . Chest pain with moderate risk of acute coronary syndrome 01/16/2017  . Osteoarthritis of both knees 06/14/2016  . Breast mass 05/17/2016  . Aortic insufficiency 03/06/2012  . Dyslipidemia 06/19/2007  . Essential hypertension 06/19/2007  . Hx of CABG 06/19/2007    Past Surgical History:  Procedure Laterality Date  . arthroscopic knee surgery    . CESAREAN SECTION    . CORONARY ARTERY BYPASS GRAFT    . LEFT HEART CATH AND CORONARY ANGIOGRAPHY N/A 01/17/2017   Procedure: LEFT HEART CATH AND CORONARY ANGIOGRAPHY;  Surgeon: Belva Crome, MD;  Location: [place] CV LAB;  Service: Cardiovascular;  Laterality: N/A;  . LEFT HEART CATHETERIZATION WITH CORONARY/GRAFT ANGIOGRAM N/A 10/12/2013   Procedure: LEFT HEART CATHETERIZATION WITH Beatrix Fetters;  Surgeon: Troy Sine, MD;  Location: [facility] CATH LAB;  Service: Cardiovascular;  Laterality: N/A;     OB History   No obstetric history on file.      Home  Medications    Prior to Admission medications   Medication Sig Start Date End Date Taking? Authorizing Provider  ALPRAZolam (XANAX) 0.25 MG tablet Take 1 tablet (0.25 mg total) by mouth daily as needed for anxiety. 05/20/18  Yes Sagardia, Ines Bloomer, MD  amLODipine (NORVASC) 5 MG tablet Take 1 tablet (5 mg total) by mouth daily. 05/06/17  Yes Weber, Damaris Hippo, PA-C  aspirin 81 MG chewable tablet Chew 1 tablet (81 mg total) by mouth daily. 01/19/17  Yes Duke, Tami Lin, PA  fluticasone (FLONASE) 50 MCG/ACT nasal spray Place 2 sprays into both nostrils daily. 05/06/17  Yes Weber, Damaris Hippo, PA-C  meloxicam (MOBIC) 7.5 MG tablet Take 7.5 mg by mouth 2 (two) times daily. 07/06/18  Yes [provider]  Multiple Vitamins-Minerals (MULTIVITAMIN WITH MINERALS) tablet Take 1 tablet by mouth daily.   Yes [provider]  nitroGLYCERIN (NITROSTAT) 0.4 MG SL tablet Place 1 tablet (0.4 mg total) under the tongue every 5 (five) minutes x 3 doses as needed for chest pain. 01/20/18  Yes Lelon Perla, MD  rosuvastatin (CRESTOR) 20 MG tablet Take 1 tablet (20 mg total) by mouth daily. 03/25/17  Yes Weber, Sarah L, PA-C  traMADol (ULTRAM) 50 MG tablet Take 50 mg by mouth at bedtime. Do not take with Xanax! 07/20/18  Yes [provider]  triamcinolone cream (KENALOG) 0.5 % Apply  1 application topically 2 (two) times daily. To fingers as needed for dry skin Patient taking differently: Apply 1 application topically 2 (two) times daily as needed (for dry skin).  01/20/16  Yes Weber, Damaris Hippo, PA-C  celecoxib (CELEBREX) 200 MG capsule Take 1 capsule (200 mg total) by mouth daily. Patient not taking: Reported on 09/22/2018 07/23/17   Mancel Bale, PA-C    Family History Family History  Problem Relation Age of Onset  . Heart disease Mother   . Heart disease Sister   . Cancer Sister     Social History Social History   Tobacco Use  . Smoking status: Former Smoker    Quit date: 1980    Years  since quitting: 40.6  . Smokeless tobacco: Never Used  . Tobacco comment: QUIT SMOKING IN 1999  Substance Use Topics  . Alcohol use: Yes    Comment: friday nights - 2 glasses - rare  . Drug use: No    Comment:  quit using in 1985     Allergies   Simvastatin   Review of Systems Review of Systems  Constitutional: Negative for chills and fever.  HENT: Negative for ear pain and sore throat.   Eyes: Negative for pain and visual disturbance.  Respiratory: Negative for cough and shortness of breath.   Cardiovascular: Negative for chest pain and palpitations.  Gastrointestinal: Positive for blood in stool and vomiting. Negative for abdominal pain.  Genitourinary: Negative for dysuria and hematuria.  Musculoskeletal: Negative for arthralgias and back pain.  Skin: Negative for color change and rash.  Neurological: Negative for seizures and syncope.  All other systems reviewed and are negative.    Physical Exam Updated Vital Signs BP 111/63 (BP Location: Right Arm)   Pulse 89   Temp 98.4 F (36.9 C) (Oral)   Resp 16   Ht 5\' 2"  (1.575 m)   Wt 63.5 kg   LMP  (Exact Date)   SpO2 99%   BMI 25.61 kg/m   Physical Exam Vitals signs and nursing note reviewed.  Constitutional:      General: She is not in acute distress.    Appearance: She is well-developed.  HENT:     Head: Normocephalic and atraumatic.  Eyes:     Conjunctiva/sclera: Conjunctivae normal.  Neck:     Musculoskeletal: Neck supple.  Cardiovascular:     Rate and Rhythm: Normal rate and regular rhythm.     Heart sounds: No murmur.  Pulmonary:     Effort: Pulmonary effort is normal. No respiratory distress.     Breath sounds: Normal breath sounds.  Abdominal:     Palpations: Abdomen is soft.     Tenderness: There is no abdominal tenderness.  Genitourinary:    Comments: Dark stool, heme occult positive, no fissures or hemorrhoids Skin:    General: Skin is warm and dry.  Neurological:     Mental Status: She  is alert.      ED Treatments / Results  Labs (all labs ordered are listed, but only abnormal results are displayed) Labs Reviewed  COMPREHENSIVE METABOLIC PANEL - Abnormal; Notable for the following components:      Result Value   Glucose, Bld 132 (*)    BUN 24 (*)    Creatinine, Ser 1.03 (*)    GFR calc non Af Amer 59 (*)    All other components within normal limits  CBC - Abnormal; Notable for the following components:   RBC 3.62 (*)  Hemoglobin 10.7 (*)    HCT 33.0 (*)    All other components within normal limits  POC OCCULT BLOOD, ED - Abnormal; Notable for the following components:   Fecal Occult Bld POSITIVE (*)    All other components within normal limits  PROTIME-INR  TYPE AND SCREEN  ABO/RH    EKG None  Radiology No results found.  Procedures Procedures (including critical care time)  Medications Ordered in ED Medications  pantoprazole (PROTONIX) injection 40 mg (40 mg Intravenous Given 09/22/18 1740)  ondansetron (ZOFRAN) injection 4 mg (4 mg Intravenous Given 09/22/18 1740)     Initial Impression / Assessment and Plan / ED Course  I have reviewed the triage vital signs and the nursing notes.  Pertinent labs & imaging results that were available during my care of the patient were reviewed by me and considered in my medical decision making (see chart for details).  Clinical Course as of Sep 21 1752  Tue Sep 22, 2018  1741 RN Lorrin Goodell chaperoned   [RD]    Clinical Course User Index [RD] Casey Starch, MD       63 year old lady presents emergency department with hematemesis, dark stools.  Hemodynamically stable, no additional emesis while in the department.  Hemoglobin 2 points lower than last check, Hemoccult positive on rectal exam.  Gave Protonix, consulted hospitalist for admission for further work-up and monitoring.  Dr. Alcario Drought accepting.  Final Clinical Impressions(s) / ED Diagnoses   Final diagnoses:  Hematemesis, presence of nausea  not specified  Acute blood loss anemia    ED Discharge Orders    None       Casey Starch, MD 09/22/18 2027

## 2018-09-22 NOTE — ED Triage Notes (Signed)
Pt reports throwing up blood and also having black tarry stools since last night.

## 2018-09-22 NOTE — ED Notes (Signed)
Attempted report x1. 

## 2018-09-23 ENCOUNTER — Inpatient Hospital Stay (HOSPITAL_COMMUNITY): Payer: Managed Care, Other (non HMO) | Admitting: Anesthesiology

## 2018-09-23 ENCOUNTER — Encounter (HOSPITAL_COMMUNITY)
Admission: EM | Disposition: A | Discharge status: Home / Self Care | Payer: Self-pay | Source: Home / Self Care | Attending: Internal Medicine

## 2018-09-23 ENCOUNTER — Encounter (HOSPITAL_COMMUNITY): Payer: Self-pay | Admitting: General Practice

## 2018-09-23 HISTORY — PX: BIOPSY: SHX5522

## 2018-09-23 HISTORY — PX: ESOPHAGOGASTRODUODENOSCOPY (EGD) WITH PROPOFOL: SHX5813

## 2018-09-23 LAB — BASIC METABOLIC PANEL
Anion gap: 8 (ref 5–15)
BUN: 17 mg/dL (ref 8–23)
CO2: 25 mmol/L (ref 22–32)
Calcium: 8.8 mg/dL — ABNORMAL LOW (ref 8.9–10.3)
Chloride: 108 mmol/L (ref 98–111)
Creatinine, Ser: 1.09 mg/dL — ABNORMAL HIGH (ref 0.44–1.00)
GFR calc Af Amer: >60 mL/min (ref >60)
GFR calc non Af Amer: 55 mL/min — ABNORMAL LOW (ref >60)
Glucose, Bld: 109 mg/dL — ABNORMAL HIGH (ref 70–99)
Potassium: 3.3 mmol/L — ABNORMAL LOW (ref 3.5–5.1)
Sodium: 141 mmol/L (ref 135–145)

## 2018-09-23 LAB — CBC
HCT: 27.1 % — ABNORMAL LOW (ref 36.0–46.0)
Hemoglobin: 8.9 g/dL — ABNORMAL LOW (ref 12.0–15.0)
MCH: 29.9 pg (ref 26.0–34.0)
MCHC: 32.8 g/dL (ref 30.0–36.0)
MCV: 90.9 fL (ref 80.0–100.0)
Platelets: 179 10*3/uL (ref 150–400)
RBC: 2.98 MIL/uL — ABNORMAL LOW (ref 3.87–5.11)
RDW: 12.1 % (ref 11.5–15.5)
WBC: 5.7 10*3/uL (ref 4.0–10.5)
nRBC: 0 % (ref 0.0–0.2)

## 2018-09-23 LAB — HIV ANTIBODY (ROUTINE TESTING W REFLEX): HIV Screen 4th Generation wRfx: NONREACTIVE

## 2018-09-23 LAB — SARS CORONAVIRUS 2 BY RT PCR (HOSPITAL ORDER, PERFORMED IN ~~LOC~~ HOSPITAL LAB): SARS Coronavirus 2: NEGATIVE

## 2018-09-23 SURGERY — ESOPHAGOGASTRODUODENOSCOPY (EGD) WITH PROPOFOL
Anesthesia: Monitor Anesthesia Care | Laterality: Left

## 2018-09-23 MED ORDER — LIDOCAINE 2% (20 MG/ML) 5 ML SYRINGE
INTRAMUSCULAR | Status: DC | PRN
  Administered 2018-09-23: 40 mg via INTRAVENOUS

## 2018-09-23 MED ORDER — SODIUM CHLORIDE 0.9 % IV SOLN: INTRAVENOUS | Status: DC

## 2018-09-23 MED ORDER — PROPOFOL 500 MG/50ML IV EMUL
INTRAVENOUS | Status: DC | PRN
  Administered 2018-09-23: 100 ug/kg/min via INTRAVENOUS

## 2018-09-23 MED ORDER — PROPOFOL 10 MG/ML IV BOLUS
INTRAVENOUS | Status: DC | PRN
  Administered 2018-09-23 (×3): 25 mg via INTRAVENOUS

## 2018-09-23 SURGICAL SUPPLY — 15 items

## 2018-09-23 NOTE — Anesthesia Procedure Notes (Signed)
Procedure Name: MAC Date/Time: 09/23/2018 3:47 PM Performed by: Colin Benton, CRNA Pre-anesthesia Checklist: Patient identified, Emergency Drugs available, Suction available and Patient being monitored Patient Re-evaluated:Patient Re-evaluated prior to induction Oxygen Delivery Method: Nasal cannula Induction Type: IV induction Placement Confirmation: positive ETCO2 Dental Injury: Teeth and Oropharynx as per pre-operative assessment

## 2018-09-23 NOTE — Consult Note (Signed)
Cross cover for Dr. Carol Ada Reason for Consult: Black stools with black vomitus. Referring Physician: Jennette Kettle, DO Casey Huerta is an 63 y.o. female.   HPI: Casey Huerta is a 63 yo F with a hx of CAD, HTN and hyperlipidemia who presented to the emergency room at [facility], a day after sudden onset dizziness followed by profuse black emesis. She states she had a small bathroom trash can that she filled up with the black emesis. She also had a large melenic melenic BM after right after she vomited the black material. The following day (the day she presented to the EDn. Following the bleed she has been experiencing periumbilical and epigastric abdominal pain as well as nausea. She reports a hx of hematemesis after her open heart surgery. She denies a history of hematochezia, abnormal colonoscopy, prior bleeds and hx of bleeding in her family. She used to take Celebrex but was switched to Mobic around a year ago and takes 1-2 per day.  She also takes 81 mg of aspirin per day for her heart disease. She drinks one and a half glasses of wine every few weeks or so. She denies smoking. She has no other complaints or concerns.  She describes having a near syncopal event but denies passing out.  She denies previous history of ulcer disease.  In the emergency room on admission she was found to have a hemoglobin of 10.7 g/dL with a drop in her hemoglobin today 8.9 g/dL today.  Her BUN BUN was slightly elevated at 24 with a creatinine of 1.03.   Past Medical History:  Diagnosis Date  . Aortic insufficiency    a. Mild-mod AI by echo 2010.  . Breast mass 05/17/2016   BIRADS 3: biopsy revealed benign fibroadenoma  . COPD   . CORONARY ARTERY DISEASE    a. CABG (LIMA-->LAD 2001). b. Myoview 2008 no ischemia, EF 63%.  c. cath 10/14/2013 mild 20% ost LAD, atretic LIMA, EF 55%, medical therapy  . Hematemesis 09/2018  . HYPERLIPIDEMIA   . HYPERTENSION   . MENORRHAGIA    Past Surgical History:   Procedure Laterality Date  . arthroscopic knee surgery    . CESAREAN SECTION    . CORONARY ARTERY BYPASS GRAFT    . LEFT HEART CATH AND CORONARY ANGIOGRAPHY N/A 01/17/2017   Procedure: LEFT HEART CATH AND CORONARY ANGIOGRAPHY;  Surgeon: Belva Crome, MD;  Location: [place] [facility];  Service: Cardiovascular;  Laterality: N/A;  . LEFT HEART CATHETERIZATION WITH CORONARY/GRAFT ANGIOGRAM N/A 10/12/2013   Procedure: LEFT HEART CATHETERIZATION WITH Beatrix Fetters;  Surgeon: Troy Sine, MD;  Location: [facility] CATH LAB;  Service: Cardiovascular;  Laterality: N/A;   Family History  Problem Relation Age of Onset  . Heart disease Mother   . Heart disease Sister   . Cancer Sister   Sister: ovarian cancer  Social History:  reports that she quit smoking about 40 years ago. She has never used smokeless tobacco. She reports current alcohol use. She reports that she does not use drugs. Lives at home with husband and grandkids who spend more time at her house than their parents house.  Allergies:  Allergies  Allergen Reactions  . Simvastatin Other (See Comments)    REACTION: constipation   Medications: I have reviewed the patient's current medications.  ROS:  As stated above in the HPI otherwise negative.  Vitals:   09/23/18 0915 09/23/18 1122  BP: (!) 124/53 (!) 119/52  Pulse: 97 77  Resp: 20 18  Temp:  98.8 F (37.1 C)  SpO2: 97% 98%   Physical Exam  Constitutional: She is oriented to person, place, and time and well-developed, well-nourished, and in no distress. No distress.  HENT:  Head: Normocephalic and atraumatic.  Cardiovascular: Normal rate, regular rhythm, normal heart sounds and intact distal pulses.  No LE edema  Pulmonary/Chest: Effort normal and breath sounds normal. No respiratory distress.  Abdominal: Soft. Bowel sounds are normal. She exhibits no distension. There is abdominal tenderness (tender to palpation over umbilicus and epigastrium ).  Neurological:  She is alert and oriented to person, place, and time.  Skin: Skin is warm and dry. She is not diaphoretic. No erythema.  Psychiatric: Affect normal.  Nursing note and vitals reviewed.  Results for orders placed or performed during the hospital encounter of 09/22/18 (from the past 48 hour(s))  Comprehensive metabolic panel     Status: Abnormal   Collection Time: 09/22/18  1:10 PM  Result Value Ref Range   Sodium 141 135 - 145 mmol/L   Potassium 3.7 3.5 - 5.1 mmol/L   Chloride 106 98 - 111 mmol/L   CO2 26 22 - 32 mmol/L   Glucose, Bld 132 (H) 70 - 99 mg/dL   BUN 24 (H) 8 - 23 mg/dL   Creatinine, Ser 1.03 (H) 0.44 - 1.00 mg/dL   Calcium 10.1 8.9 - 10.3 mg/dL   Total Protein 6.5 6.5 - 8.1 g/dL   Albumin 3.6 3.5 - 5.0 g/dL   AST 20 15 - 41 U/L   ALT 19 0 - 44 U/L   Alkaline Phosphatase 58 38 - 126 U/L   Total Bilirubin 0.6 0.3 - 1.2 mg/dL   GFR calc non Af Amer 59 (L) >60 mL/min   GFR calc Af Amer >60 >60 mL/min   Anion gap 9 5 - 15    Comment: Performed at [facility], [address] [place], [place] [address]  CBC     Status: Abnormal   Collection Time: 09/22/18  1:10 PM  Result Value Ref Range   WBC 6.1 4.0 - 10.5 K/uL   RBC 3.62 (L) 3.87 - 5.11 MIL/uL   Hemoglobin 10.7 (L) 12.0 - 15.0 g/dL   HCT 33.0 (L) 36.0 - 46.0 %   MCV 91.2 80.0 - 100.0 fL   MCH 29.6 26.0 - 34.0 pg   MCHC 32.4 30.0 - 36.0 g/dL   RDW 11.9 11.5 - 15.5 %   Platelets 225 150 - 400 K/uL   nRBC 0.0 0.0 - 0.2 %    Comment: Performed at [facility], [place] [address] [place], [place] [address]  Protime-INR - (order if Patient is taking Coumadin / Warfarin)     Status: None   Collection Time: 09/22/18  1:10 PM  Result Value Ref Range   Prothrombin Time 13.8 11.4 - 15.2 seconds   INR 1.1 0.8 - 1.2    Comment: (NOTE) INR goal varies based on device and disease states. Performed at [facility], [place] [address], [place], [place] [address]   Type and screen [place]      Status: None   Collection Time: 09/22/18  1:16 PM  Result Value Ref Range   ABO/RH(D) A POS    Antibody Screen NEG    Sample Expiration      09/25/2018,2359 Performed at [facility], [place] [address], [place], [place] [address]   ABO/Rh  Status: None   Collection Time: 09/22/18  1:16 PM  Result Value Ref Range   ABO/RH(D)      A POS Performed at [facility] [address] [place], [place] [address]   POC occult blood, ED     Status: Abnormal   Collection Time: 09/22/18  5:32 PM  Result Value Ref Range   Fecal Occult Bld POSITIVE (A) NEGATIVE  Hemoglobin and hematocrit, blood     Status: Abnormal   Collection Time: 09/22/18  8:48 PM  Result Value Ref Range   Hemoglobin 10.6 (L) 12.0 - 15.0 g/dL   HCT 32.4 (L) 36.0 - 46.0 %    Comment: Performed at [facility] [address], [place], [place] [address]  SARS Coronavirus 2 [facility] order, Performed in [facility] hospital lab)     Status: None   Collection Time: 09/22/18 10:46 PM  Result Value Ref Range   SARS Coronavirus 2 NEGATIVE NEGATIVE    Comment: (NOTE) If result is NEGATIVE SARS-CoV-2 target nucleic acids are NOT DETECTED. The SARS-CoV-2 RNA is generally detectable in upper and lower  respiratory specimens during the acute phase of infection. The lowest  concentration of SARS-CoV-2 viral copies this assay can detect is 250  copies / mL. A negative result does not preclude SARS-CoV-2 infection  and should not be used as the sole basis for treatment or other  patient management decisions.  A negative result may occur with  improper specimen collection / handling, submission of specimen other  than nasopharyngeal swab, presence of viral mutation(s) within the  areas targeted by this assay, and inadequate number of viral copies  (<250 copies / mL). A negative result must be combined with clinical  observations, patient history, and epidemiological information. If result is POSITIVE SARS-CoV-2  target nucleic acids are DETECTED. The SARS-CoV-2 RNA is generally detectable in upper and lower  respiratory specimens dur ing the acute phase of infection.  Positive  results are indicative of active infection with SARS-CoV-2.  Clinical  correlation with patient history and other diagnostic information is  necessary to determine patient infection status.  Positive results do  not rule out bacterial infection or co-infection with other viruses. If result is PRESUMPTIVE POSTIVE SARS-CoV-2 nucleic acids MAY BE PRESENT.   A presumptive positive result was obtained on the submitted specimen  and confirmed on repeat testing.  While 2019 novel coronavirus  (SARS-CoV-2) nucleic acids may be present in the submitted sample  additional confirmatory testing may be necessary for epidemiological  and / or clinical management purposes  to differentiate between  SARS-CoV-2 and other Sarbecovirus currently known to infect humans.  If clinically indicated additional testing with an alternate test  methodology [phone]) is advised. The SARS-CoV-2 RNA is generally  detectable in upper and lower respiratory sp ecimens during the acute  phase of infection. The expected result is Negative. Fact Sheet for Patients:  StrictlyIdeas.no Fact Sheet for Healthcare Providers: BankingDealers.co.za This test is not yet approved or cleared by the [place] FDA and has been authorized for detection and/or diagnosis of SARS-CoV-2 by FDA under an Emergency Use Authorization (EUA).  This EUA will remain in effect (meaning this test can be used) for the duration of the COVID-19 declaration under Section 564(b)(1) of the Act, 21 U.S.C. section 360bbb-3(b)(1), unless the authorization is terminated or revoked sooner. Performed at [facility], [place] [address] [place], [place] [address]   CBC     Status: Abnormal  Collection Time: 09/23/18  5:12 AM  Result Value  Ref Range   WBC 5.7 4.0 - 10.5 K/uL   RBC 2.98 (L) 3.87 - 5.11 MIL/uL   Hemoglobin 8.9 (L) 12.0 - 15.0 g/dL   HCT 27.1 (L) 36.0 - 46.0 %   MCV 90.9 80.0 - 100.0 fL   MCH 29.9 26.0 - 34.0 pg   MCHC 32.8 30.0 - 36.0 g/dL   RDW 12.1 11.5 - 15.5 %   Platelets 179 150 - 400 K/uL   nRBC 0.0 0.0 - 0.2 %    Comment: Performed at [facility], [place] [address] [place], [place] [address]  Basic metabolic panel     Status: Abnormal   Collection Time: 09/23/18  5:12 AM  Result Value Ref Range   Sodium 141 135 - 145 mmol/L   Potassium 3.3 (L) 3.5 - 5.1 mmol/L   Chloride 108 98 - 111 mmol/L   CO2 25 22 - 32 mmol/L   Glucose, Bld 109 (H) 70 - 99 mg/dL   BUN 17 8 - 23 mg/dL   Creatinine, Ser 1.09 (H) 0.44 - 1.00 mg/dL   Calcium 8.8 (L) 8.9 - 10.3 mg/dL   GFR calc non Af Amer 55 (L) >60 mL/min   GFR calc Af Amer >60 >60 mL/min   Anion gap 8 5 - 15    Comment: Performed at [facility] [address], [place], [place] [address]   Assessment/Plan: 1) Melena with black emesis, epigastric pain and iron deficiency anemia: We will proceed with an EGD today and make further recommendations thereafter.  I suspect she may ulcers from the chronic use of meloxicam for the last 1 year. 2) Hypertension/Hhyperlipidemia/CAD s/p CABG/history of aortic insufficiency. 3) COPD.   Al Decant 09/23/2018, 12:40 PM

## 2018-09-23 NOTE — Progress Notes (Signed)
  PROGRESS NOTE  [name] ZOX:[id number] DOB: 03-13-1955 DOA: 09/22/2018 PCP: Mancel Bale, PA-C  Brief History   The patient is a 63 yr old woman who presented to [facility] ED with complaints of hematemesis and melena. Her symptoms began yesterday with epigastric abdominal pain. The patient has been taking celebrex for the past year, although that was recently changed to Mobic.  Consultants  . Gastroenterology  Procedures  . EGD  Antibiotics   Anti-infectives (From admission, onward)   None    .  Subjective  The patient is resting comfortably. No new complaints.  Objective   Vitals:  Vitals:   09/23/18 1615 09/23/18 1633  BP: (!) 124/47 119/69  Pulse: 71 73  Resp: 16 16  Temp:  98.2 F (36.8 C)  SpO2: 100% 100%    Exam:  Constitutional:  . The patient is awake, alert, and oriented x 3. No acute distress. Respiratory:  . No increased work of breathing. . No wheezes, rales, or rhonchi. . No tactile fremitus. Cardiovascular:  . Regular rate and rhythm. . No murmurs, ectopy, or gallups. . No lateral PMI. No thrills. Abdomen:  . Abdomen is soft, non-tender, non-distended. . No hernias, masses, or organomegaly . Normoactive bowel sounds.  Musculoskeletal:  . No cyanosis, clubbing, or edema Skin:  . No rashes, lesions, ulcers . palpation of skin: no induration or nodules Neurologic:  . CN 2-12 intact . Sensation all 4 extremities intact Psychiatric:  . Mental status o Mood, affect appropriate o Orientation to person, place, time  . judgment and insight appear intact    I have personally reviewed the following:   Today's Data  . Vitals, CBC, BMP  Other Data  . EGD demonstrated multiple non-bleeding cratered gastric ulcers.  Scheduled Meds: . fluticasone  2 spray Each Nare Daily  . multivitamin with minerals  1 tablet Oral Daily  . [START ON 09/26/2018] pantoprazole  40 mg Intravenous Q12H  . rosuvastatin  20 mg Oral Daily  . traMADol  50 mg Oral  QHS   Continuous Infusions: . sodium chloride 75 mL/hr at 09/23/18 1154  . pantoprozole (PROTONIX) infusion 8 mg/hr (09/23/18 0915)    Principal Problem:   UGIB (upper gastrointestinal bleed) Active Problems:   Essential hypertension   Hx of CABG   LOS: 1 day   A & P   Upper GI Bleed: Multiple non-bleeding cratered ulcers. Recommendations from GI include sucralfate. Continue PPI. No NSAIDS. Recommends a high fiber diet. Monitor hemoglobin. Transfuse for hemoglobin less than 7.0. Hemodynamically stable.   HTN: Blood pressures. Hold amlodipine.  H/O CABG: Cont statin. Hold ASA.  I have seen and examined this patient myself. I have spent 32 minutes in her evaluation and care.  DVT prophylaxis: SCDs Code Status: Full Family Communication: No family in room Disposition Plan: Home after admit   Toniesha Zellner, DO [facility] Direct contact: see [email or web address]  7PM-7AM contact night coverage as above  09/23/2018, 6:51 PM  LOS: 1 day

## 2018-09-23 NOTE — Progress Notes (Signed)
Patient tolerated liquid diet well, asking for regular one. Advanced diet to regular per MD orders

## 2018-09-23 NOTE — Anesthesia Postprocedure Evaluation (Signed)
Anesthesia Post Note  Patient: Casey Huerta  Procedure(s) Performed: ESOPHAGOGASTRODUODENOSCOPY (EGD) WITH PROPOFOL (Left ) BIOPSY     Patient location during evaluation: PACU Anesthesia Type: MAC Level of consciousness: awake and alert Pain management: pain level controlled Vital Signs Assessment: post-procedure vital signs reviewed and stable Respiratory status: spontaneous breathing and respiratory function stable Cardiovascular status: stable Postop Assessment: no apparent nausea or vomiting Anesthetic complications: no    Last Vitals:  Vitals:   09/23/18 1615 09/23/18 1633  BP: (!) 124/47 119/69  Pulse: 71 73  Resp: 16 16  Temp:  36.8 C  SpO2: 100% 100%    Last Pain:  Vitals:   09/23/18 1640  TempSrc:   PainSc: 0-No pain                 Arleene Settle DANIEL

## 2018-09-23 NOTE — Transfer of Care (Signed)
Immediate Anesthesia Transfer of Care Note  Patient: Casey Huerta  Procedure(s) Performed: ESOPHAGOGASTRODUODENOSCOPY (EGD) WITH PROPOFOL (Left ) BIOPSY  Patient Location: Endoscopy Unit  Anesthesia Type:MAC  Level of Consciousness: awake, alert , oriented and patient cooperative  Airway & Oxygen Therapy: Patient Spontanous Breathing and Patient connected to nasal cannula oxygen  Post-op Assessment: Report given to RN and Post -op Vital signs reviewed and stable  Post vital signs: Reviewed and stable  Last Vitals:  Vitals Value Taken Time  BP 117/81 09/23/18 1604  Temp    Pulse 81 09/23/18 1605  Resp 14 09/23/18 1605  SpO2 95 % 09/23/18 1605  Vitals shown include unvalidated device data.  Last Pain:  Vitals:   09/23/18 1502  TempSrc: Oral  PainSc: 0-No pain         Complications: No apparent anesthesia complications

## 2018-09-23 NOTE — Progress Notes (Signed)
Patient arrived to 3E20 from [facility]. A&Ox4. No complaints of pain. SR on telemetry. No skin breakdown. VSS. Gen plan of care initiated.

## 2018-09-23 NOTE — Op Note (Signed)
[facility] Patient Name: Casey Huerta Procedure Date : 09/23/2018 MRN: [id number] Attending MD: Juanita Craver , MD Date of Birth: [birth year] CSN: [id number] Age: 63 Admit Type: Inpatient Procedure:                EGD with antral biopsies. Indications:              Iron deficiency anemia, Coffee-ground emesis,                            Melena, Providers:                Juanita Craver, MD, Raynelle Bring, RN, Marguerita Merles,                            Technician, Pearline Cables, CRNA Referring MD:             Carol Ada, MD, Damaris Hippo. Weber, PA-C Medicines:                Monitored Anesthesia Care Complications:            No immediate complications. Estimated Blood Loss:     Estimated blood loss was minimal. Procedure:                Pre-Anesthesia Assessment: - Prior to the                            procedure, a history and physical was performed,                            and patient medications and allergies were                            reviewed. The patient's tolerance of previous                            anesthesia was also reviewed. The risks and                            benefits of the procedure and the sedation options                            and risks were discussed with the patient. All                            questions were answered, and informed consent was                            obtained. Prior Anticoagulants: The patient has                            taken antiplatelet medication, last dose was 3 days                            prior to procedure. ASA Grade Assessment: II - A  patient with mild systemic disease. After reviewing                            the risks and benefits, the patient was deemed in                            satisfactory condition to undergo the procedure.                            After obtaining informed consent, the endoscope was                            passed under direct vision.  Throughout the                            procedure, the patient's blood pressure, pulse, and                            oxygen saturations were monitored continuously. The                            GIF-H190 ([id number]) Olympus gastroscope was                            introduced through the mouth, and advanced to the                            second part of duodenum. The EGD was accomplished                            without difficulty. The patient tolerated the                            procedure well. Scope In: Scope Out: Findings:      The examined esophagus and GEJ appeared widely patent and normal.      Multiple non-bleeding cratered gastric ulcers were found in the gastric       antrum-antral biopsies were taken with a cold forceps for histology.      The examined duodenum was normal. Impression:               - Normal appearing, widely patent esophagus and GEJ.                           - Multiple non-bleeding gastric ulcers with no                            stigmata of bleeding noted in the antrum-antral                            biopsies were done to rul;e out H. pylori by                            pathology.                           -  Normal stomach on retroflexion.                           - Normal examined duodenum. Moderate Sedation:      MAC used Recommendation:           - High fiber diet with augmented water consumption                            daily.                           - Continue present medications; STOP TAKING ALL                            NSAIDS INCLUDING MELOXICAM .                           - Use Sucralfate suspension 1 gram PO QID for 1                            month.                           - Return to my office in 2 weeks. Procedure Code(s):        --- Professional ---                           [phone], Esophagogastroduodenoscopy, flexible,                            transoral; with biopsy, single or multiple Diagnosis Code(s):         --- Professional ---                           D50.9, Iron deficiency anemia, unspecified                           K92.1, Melena (includes Hematochezia)                           K92.0, Hematemesis                           K25.9, Gastric ulcer, unspecified as acute or                            chronic, without hemorrhage or perforation CPT copyright 2019 American Medical Association. All rights reserved. The codes documented in this report are preliminary and upon coder review may  be revised to meet current compliance requirements. Juanita Craver, MD Juanita Craver, MD 09/23/2018 4:16:58 PM This report has been signed electronically. Number of Addenda: 0

## 2018-09-23 NOTE — Anesthesia Preprocedure Evaluation (Addendum)
Anesthesia Evaluation  Patient identified by MRN, date of birth, ID band Patient awake    Reviewed: Allergy & Precautions, NPO status , Patient's Chart, lab work & pertinent test results  History of Anesthesia Complications Negative for: history of anesthetic complications  Airway Mallampati: II  TM Distance: >3 FB Neck ROM: Full    Dental  (+) Upper Dentures, Lower Dentures   Pulmonary COPD, former smoker,    Pulmonary exam normal        Cardiovascular Exercise Tolerance: Good hypertension, Pt. on medications + CAD and + CABG  Normal cardiovascular exam   '18 Cath - Codominant coronary anatomy. Normal left main. 20-25% ostial/proximal left anterior descending.  LAD is large and wraps the left ventricular apex.  No significant obstruction is seen. Large circumflex without evidence of significant obstructive disease. Codominant right coronary with no evidence of obstruction. Normal left ventricular function with EF 55%.  LVEDP 12 mmHg Occluded proximal LIMA to LAD identified by retrograde flow of LIMA from LAD to proximal segment where total occlusion exists. Angiography is unchanged from study done in 2015    Neuro/Psych negative neurological ROS  negative psych ROS   GI/Hepatic negative GI ROS, Neg liver ROS,   Endo/Other  negative endocrine ROS  Renal/GU negative Renal ROS     Musculoskeletal  (+) Arthritis ,   Abdominal   Peds  Hematology  (+) anemia ,   Anesthesia Other Findings   Reproductive/Obstetrics                           Anesthesia Physical Anesthesia Plan  ASA: III  Anesthesia Plan: MAC   Post-op Pain Management:    Induction: Intravenous  PONV Risk Score and Plan: 2 and Treatment may vary due to age or medical condition  Airway Management Planned: Nasal Cannula and Natural Airway  Additional Equipment: None  Intra-op Plan:   Post-operative Plan:    Informed Consent: I have reviewed the patients History and Physical, chart, labs and discussed the procedure including the risks, benefits and alternatives for the proposed anesthesia with the patient or authorized representative who has indicated his/her understanding and acceptance.       Plan Discussed with: CRNA and Anesthesiologist  Anesthesia Plan Comments:        Anesthesia Quick Evaluation

## 2018-09-24 ENCOUNTER — Encounter (HOSPITAL_COMMUNITY): Payer: Self-pay | Admitting: Gastroenterology

## 2018-09-24 LAB — BASIC METABOLIC PANEL
Anion gap: 9 (ref 5–15)
BUN: 6 mg/dL — ABNORMAL LOW (ref 8–23)
CO2: 23 mmol/L (ref 22–32)
Calcium: 8.4 mg/dL — ABNORMAL LOW (ref 8.9–10.3)
Chloride: 108 mmol/L (ref 98–111)
Creatinine, Ser: 0.88 mg/dL (ref 0.44–1.00)
GFR calc Af Amer: >60 mL/min (ref >60)
GFR calc non Af Amer: >60 mL/min (ref >60)
Glucose, Bld: 95 mg/dL (ref 70–99)
Potassium: 3.4 mmol/L — ABNORMAL LOW (ref 3.5–5.1)
Sodium: 140 mmol/L (ref 135–145)

## 2018-09-24 LAB — GLUCOSE, CAPILLARY: Glucose-Capillary: 97 mg/dL (ref 70–99)

## 2018-09-24 MED ORDER — PANTOPRAZOLE SODIUM 40 MG PO TBEC: 40.0000 mg | DELAYED_RELEASE_TABLET | Freq: Every day | ORAL | 0 refills | Status: DC

## 2018-09-24 NOTE — Discharge Summary (Signed)
Physician Discharge Summary  [name]:[id number] DOB: 01/24/1956 DOA: 09/22/2018  PCP: Mancel Bale, PA-C  Admit date: 09/22/2018 Discharge date: 09/24/2018  Recommendations for Outpatient Follow-up:  1. Follow up with PCP in 7-10 days. 2. Check CBC in one week. 3. Follow up with GI in 6 weeks. 4. Do not take any NSAIDS.  Follow-up Information    Mancel Bale, PA-C On 10/01/2018.   Specialty: Physician Assistant Why: For Hospital Follow-up @ 8:40 am  Contact information: [place] [address] [phone]            Discharge Diagnoses: Principal diagnosis is #1 1. Upper GI bleed with hematemesis and melena 2. Hypertension 3. CAD 4. Anxiety  Discharge Condition: Good Disposition: Home  Diet recommendation: Heart healthy  Filed Weights   09/23/18 0418 09/23/18 1502 09/24/18 0147  Weight: 58 kg 58 kg 58.7 kg    History of present illness:  Casey Huerta is a 63 y.o. female with medical history significant of CAD s/p CABG, HTN, HLD, mild-mod AI. The patient presents to ED with c/o hematemesis and melena.  Symptoms onset yesterday, severe emesis with significant blood. She also complains of very mild epigastric abdominal pain. Associated black tarry and loose stools. She reports taking celebrex for the past year, this was changed to Mobic recently. She also takes ASA 81 daily.  HGB was 10.7 on presentation (12.5 in Jan).  Hospital Course:  The patient is a 63 yr old woman who presented to [facility] ED with complaints of hematemesis and melena. Her symptoms began yesterday with epigastric abdominal pain. The patient has been taking celebrex for the past year, although that was recently changed to Mobic. The patient underwent EGD on 09/23/2018 which demonstrated a gastric ulcer. Dr. Benson Norway has cleared the patient for discharge to home with PPI. She is to avoid NSAIDS. She will follow up with GI in 2 weeks. She will have her CBC checked in  7 days.  Today's assessment: S: The patient is resting comfortably. No new complaints. O: Vitals:  Vitals:   09/24/18 0152 09/24/18 0452  BP: (!) 110/56 (!) 119/53  Pulse: 80 75  Resp:  20  Temp: 98.4 F (36.9 C) 98.8 F (37.1 C)  SpO2: 98% 96%    Constitutional:   The patient is awake, alert, and oriented x 3. No acute distress. Respiratory:   No increased work of breathing.  No wheezes, rales, or rhonchi.  No tactile fremitus. Cardiovascular:   Regular rate and rhythm.  No murmurs, ectopy, or gallups.  No lateral PMI. No thrills. Abdomen:   Abdomen is soft, non-tender, non-distended.  No hernias, masses, or organomegaly  Normoactive bowel sounds.  Musculoskeletal:   No cyanosis, clubbing, or edema Skin:   No rashes, lesions, ulcers  palpation of skin: no induration or nodules Neurologic:   CN 2-12 intact  Sensation all 4 extremities intact Psychiatric:   Mental status ? Mood, affect appropriate ? Orientation to person, place, time   judgment and insight appear intact   Discharge Instructions  Discharge Instructions    Activity as tolerated - No restrictions   Complete by: As directed    Call MD for:   Complete by: As directed    Vomiting blood or black stools.   Call MD for:  persistant nausea and vomiting   Complete by: As directed    Call MD for:  severe uncontrolled pain   Complete by: As directed  Diet - low sodium heart healthy   Complete by: As directed    Discharge instructions   Complete by: As directed    Follow up with PCP in 7-10 days. Follow up with GI in 6 weeks. Do not take any NSAIDS.   Increase activity slowly   Complete by: As directed      Allergies as of 09/24/2018      Reactions   Simvastatin Other (See Comments)   REACTION: constipation      Medication List    STOP taking these medications   amLODipine 5 MG tablet Commonly known as: NORVASC   meloxicam 7.5 MG tablet Commonly known as: MOBIC       TAKE these medications   ALPRAZolam 0.25 MG tablet Commonly known as: XANAX Take 1 tablet (0.25 mg total) by mouth daily as needed for anxiety.   aspirin 81 MG chewable tablet Chew 1 tablet (81 mg total) by mouth daily.   fluticasone 50 MCG/ACT nasal spray Commonly known as: FLONASE Place 2 sprays into both nostrils daily.   multivitamin with minerals tablet Take 1 tablet by mouth daily.   nitroGLYCERIN 0.4 MG SL tablet Commonly known as: NITROSTAT Place 1 tablet (0.4 mg total) under the tongue every 5 (five) minutes x 3 doses as needed for chest pain.   pantoprazole 40 MG tablet Commonly known as: Protonix Take 1 tablet (40 mg total) by mouth daily.   rosuvastatin 20 MG tablet Commonly known as: CRESTOR Take 1 tablet (20 mg total) by mouth daily.   traMADol 50 MG tablet Commonly known as: ULTRAM Take 50 mg by mouth at bedtime. Do not take with Xanax!   triamcinolone cream 0.5 % Commonly known as: KENALOG Apply 1 application topically 2 (two) times daily. To fingers as needed for dry skin What changed:   when to take this  reasons to take this  additional instructions      Allergies  Allergen Reactions   Simvastatin Other (See Comments)    REACTION: constipation    The results of significant diagnostics from this hospitalization (including imaging, microbiology, ancillary and laboratory) are listed below for reference.    Significant Diagnostic Studies: No results found.  Microbiology: Recent Results (from the past 240 hour(s))  SARS Coronavirus 2 [facility] order, Performed in [facility] hospital lab)     Status: None   Collection Time: 09/22/18 10:46 PM  Result Value Ref Range Status   SARS Coronavirus 2 NEGATIVE NEGATIVE Final    Comment: (NOTE) If result is NEGATIVE SARS-CoV-2 target nucleic acids are NOT DETECTED. The SARS-CoV-2 RNA is generally detectable in upper and lower  respiratory specimens during the acute phase of infection. The lowest   concentration of SARS-CoV-2 viral copies this assay can detect is 250  copies / mL. A negative result does not preclude SARS-CoV-2 infection  and should not be used as the sole basis for treatment or other  patient management decisions.  A negative result may occur with  improper specimen collection / handling, submission of specimen other  than nasopharyngeal swab, presence of viral mutation(s) within the  areas targeted by this assay, and inadequate number of viral copies  (<250 copies / mL). A negative result must be combined with clinical  observations, patient history, and epidemiological information. If result is POSITIVE SARS-CoV-2 target nucleic acids are DETECTED. The SARS-CoV-2 RNA is generally detectable in upper and lower  respiratory specimens dur ing the acute phase of infection.  Positive  results are indicative  of active infection with SARS-CoV-2.  Clinical  correlation with patient history and other diagnostic information is  necessary to determine patient infection status.  Positive results do  not rule out bacterial infection or co-infection with other viruses. If result is PRESUMPTIVE POSTIVE SARS-CoV-2 nucleic acids MAY BE PRESENT.   A presumptive positive result was obtained on the submitted specimen  and confirmed on repeat testing.  While 2019 novel coronavirus  (SARS-CoV-2) nucleic acids may be present in the submitted sample  additional confirmatory testing may be necessary for epidemiological  and / or clinical management purposes  to differentiate between  SARS-CoV-2 and other Sarbecovirus currently known to infect humans.  If clinically indicated additional testing with an alternate test  methodology [phone]) is advised. The SARS-CoV-2 RNA is generally  detectable in upper and lower respiratory sp ecimens during the acute  phase of infection. The expected result is Negative. Fact Sheet for Patients:  StrictlyIdeas.no Fact Sheet  for Healthcare Providers: BankingDealers.co.za This test is not yet approved or cleared by the [place] FDA and has been authorized for detection and/or diagnosis of SARS-CoV-2 by FDA under an Emergency Use Authorization (EUA).  This EUA will remain in effect (meaning this test can be used) for the duration of the COVID-19 declaration under Section 564(b)(1) of the Act, 21 U.S.C. section 360bbb-3(b)(1), unless the authorization is terminated or revoked sooner. Performed at [facility], [place] [address], [place], [place] [address]      Labs: Basic Metabolic Panel: Recent Labs  Lab 09/22/18 1310 09/23/18 0512 09/24/18 0502  NA 141 141 140  K 3.7 3.3* 3.4*  CL 106 108 108  CO2 26 25 23   GLUCOSE 132* 109* 95  BUN 24* 17 6*  CREATININE 1.03* 1.09* 0.88  CALCIUM 10.1 8.8* 8.4*   Liver Function Tests: Recent Labs  Lab 09/22/18 1310  AST 20  ALT 19  ALKPHOS 58  BILITOT 0.6  PROT 6.5  ALBUMIN 3.6   No results for input(s): LIPASE, AMYLASE in the last 168 hours. No results for input(s): AMMONIA in the last 168 hours. CBC: Recent Labs  Lab 09/22/18 1310 09/22/18 2048 09/23/18 0512  WBC 6.1  --  5.7  HGB 10.7* 10.6* 8.9*  HCT 33.0* 32.4* 27.1*  MCV 91.2  --  90.9  PLT 225  --  179   Cardiac Enzymes: No results for input(s): CKTOTAL, CKMB, CKMBINDEX, TROPONINI in the last 168 hours. BNP: BNP (last 3 results) No results for input(s): BNP in the last 8760 hours.  ProBNP (last 3 results) No results for input(s): PROBNP in the last 8760 hours.  CBG: Recent Labs  Lab 09/24/18 1229  GLUCAP 97    Principal Problem:   UGIB (upper gastrointestinal bleed) Active Problems:   Essential hypertension   Hx of CABG   Time coordinating discharge: 38 minutes.  Signed:        Amiaya Mcneeley, DO [facility]  09/24/2018, 5:51 PM

## 2018-09-24 NOTE — Progress Notes (Signed)
Subjective: No complaints.  Objective: Vital signs in last 24 hours: Temp:  [98.2 F (36.8 C)-98.8 F (37.1 C)] 98.8 F (37.1 C) (08/06 0452) Pulse Rate:  [71-97] 75 (08/06 0452) Resp:  [16-20] 20 (08/06 0452) BP: (110-154)/(45-81) 119/53 (08/06 0452) SpO2:  [96 %-100 %] 96 % (08/06 0452) Weight:  [58 kg-58.7 kg] 58.7 kg (08/06 0147) Last BM Date: 09/22/18  Intake/Output from previous day: 08/05 0701 - 08/06 0700 In: 1402 [P.O.:702; I.V.:700] Out: 1700 [Urine:1700] Intake/Output this shift: No intake/output data recorded.  General appearance: alert and no distress GI: soft, non-tender; bowel sounds normal; no masses,  no organomegaly  Lab Results: Recent Labs    09/22/18 1310 09/22/18 2048 09/23/18 0512  WBC 6.1  --  5.7  HGB 10.7* 10.6* 8.9*  HCT 33.0* 32.4* 27.1*  PLT 225  --  179   BMET Recent Labs    09/22/18 1310 09/23/18 0512 09/24/18 0502  NA 141 141 140  K 3.7 3.3* 3.4*  CL 106 108 108  CO2 26 25 23   GLUCOSE 132* 109* 95  BUN 24* 17 6*  CREATININE 1.03* 1.09* 0.88  CALCIUM 10.1 8.8* 8.4*   LFT Recent Labs    09/22/18 1310  PROT 6.5  ALBUMIN 3.6  AST 20  ALT 19  ALKPHOS 58  BILITOT 0.6   PT/INR Recent Labs    09/22/18 1310  LABPROT 13.8  INR 1.1   Hepatitis Panel No results for input(s): HEPBSAG, HCVAB, HEPAIGM, HEPBIGM in the last 72 hours. C-Diff No results for input(s): CDIFFTOX in the last 72 hours. Fecal Lactopherrin No results for input(s): FECLLACTOFRN in the last 72 hours.  Studies/Results: No results found.  Medications:  Scheduled: . fluticasone  2 spray Each Nare Daily  . multivitamin with minerals  1 tablet Oral Daily  . [START ON 09/26/2018] pantoprazole  40 mg Intravenous Q12H  . rosuvastatin  20 mg Oral Daily  . traMADol  50 mg Oral QHS   Continuous: . sodium chloride 75 mL/hr at 09/24/18 0228  . pantoprozole (PROTONIX) infusion 8 mg/hr (09/23/18 2049)    Assessment/Plan: 1) Gastric ulcer. 2) NSAID use  in the form of Mobic. 3) Anemia - stable.   She can continue on a PPI and sucralfate as outlined in Dr. Lorie Apley note.  Avoid NSAIDs.  Plan: 1) Okay to D/C home. 2) Follow up in the office in 2 weeks.    LOS: 2 days   Twylia Oka D 09/24/2018, 7:06 AM

## 2018-11-02 NOTE — Progress Notes (Signed)
Virtual Visit via Video Note   This visit type was conducted due to national recommendations for restrictions regarding the COVID-19 Pandemic (e.g. social distancing) in an effort to limit this patient's exposure and mitigate transmission in our community.  Due to her co-morbid illnesses, this patient is at least at moderate risk for complications without adequate follow up.  This format is felt to be most appropriate for this patient at this time.  All issues noted in this document were discussed and addressed.  A limited physical exam was performed with this format.  Please refer to the patient's chart for her consent to telehealth for [facility].   Date:  11/03/2018   ID:  Casey Huerta, DOB 05-31-1955, MRN [id number]  Patient Location:Home Provider Location: Home  PCP:  Mancel Bale, PA-C  Cardiologist:  Dr Stanford Breed  Evaluation Performed:  Follow-Up Visit  Chief Complaint:  FU CAD  History of Present Illness:    FUcoronary artery disease. She is status post coronary artery bypass and graft in 2001. Echocardiogram in March of 2010 showed normal LV function, mild to moderate aortic insufficiency and mild mitral regurgitation.Abdominal CT September 2016 showed no aneurysm.  Cardiac catheterization November 2018 showed 20 to 25% LAD and normal LV function.  LIMA to the LAD occluded proximally.  Echocardiogram ordered at last office visit April 2018 but not performed.  Sincelast seen,patient complains of weakness and fatigue.  She has dyspnea on exertion which is unchanged.  She occasionally has a sharp chest pain for 1 to 2 seconds but denies exertional chest pain.  No syncope.  The patient does not have symptoms concerning for COVID-19 infection (fever, chills, cough, or new shortness of breath).    Past Medical History:  Diagnosis Date  . Aortic insufficiency    a. Mild-mod AI by echo 2010.  . Breast mass 05/17/2016   BIRADS 3: biopsy revealed benign fibroadenoma  .  COPD   . CORONARY ARTERY DISEASE    a. CABG (LIMA-->LAD 2001). b. Myoview 2008 no ischemia, EF 63%.  c. cath 10/14/2013 mild 20% ost LAD, atretic LIMA, EF 55%, medical therapy  . Hematemesis 09/2018  . HYPERLIPIDEMIA   . HYPERTENSION   . MENORRHAGIA    Past Surgical History:  Procedure Laterality Date  . arthroscopic knee surgery    . BIOPSY  09/23/2018   Procedure: BIOPSY;  Surgeon: Juanita Craver, MD;  Location: [facility] ENDOSCOPY;  Service: Endoscopy;;  . CESAREAN SECTION    . CORONARY ARTERY BYPASS GRAFT    . ESOPHAGOGASTRODUODENOSCOPY (EGD) WITH PROPOFOL Left 09/23/2018   Procedure: ESOPHAGOGASTRODUODENOSCOPY (EGD) WITH PROPOFOL;  Surgeon: Juanita Craver, MD;  Location: [facility] ENDOSCOPY;  Service: Endoscopy;  Laterality: Left;  . LEFT HEART CATH AND CORONARY ANGIOGRAPHY N/A 01/17/2017   Procedure: LEFT HEART CATH AND CORONARY ANGIOGRAPHY;  Surgeon: Belva Crome, MD;  Location: [facility] LAB;  Service: Cardiovascular;  Laterality: N/A;  . LEFT HEART CATHETERIZATION WITH CORONARY/GRAFT ANGIOGRAM N/A 10/12/2013   Procedure: LEFT HEART CATHETERIZATION WITH Beatrix Fetters;  Surgeon: Troy Sine, MD;  Location[facility] C[facility] LAB;  Service: Cardiovascular;  Laterality: N/A;     Current Meds  Medication Sig  . ALPRAZolam (XANAX) 0.25 MG tablet Take 1 tablet (0.25 mg total) by mouth daily as needed for anxiety.  Marland Kitchen aspirin 81 MG chewable tablet Chew 1 tablet (81 mg total) by mouth daily.  . fluticasone (FLONASE) 50 MCG/ACT nasal spray Place 2 sprays into both nostrils daily.  . Multiple Vitamins-Minerals (  MULTIVITAMIN WITH MINERALS) tablet Take 1 tablet by mouth daily.  . nitroGLYCERIN (NITROSTAT) 0.4 MG SL tablet Place 1 tablet (0.4 mg total) under the tongue every 5 (five) minutes x 3 doses as needed for chest pain.  . rosuvastatin (CRESTOR) 20 MG tablet Take 1 tablet (20 mg total) by mouth daily.  . traMADol (ULTRAM) 50 MG tablet Take 50 mg by mouth at bedtime. Do not take with Xanax!  Marland Kitchen  triamcinolone cream (KENALOG) 0.5 % Apply 1 application topically 2 (two) times daily. To fingers as needed for dry skin (Patient taking differently: Apply 1 application topically 2 (two) times daily as needed (for dry skin). )     Allergies:   Simvastatin   Social History   Tobacco Use  . Smoking status: Former Smoker    Quit date: 1980    Years since quitting: 40.7  . Smokeless tobacco: Never Used  . Tobacco comment: QUIT SMOKING IN 1999  Substance Use Topics  . Alcohol use: Yes    Comment: friday nights - 2 glasses - rare  . Drug use: No    Comment:  quit using in 1985     Family Hx: The patient's family history includes Cancer in her sister; Heart disease in her mother and sister.  ROS:   Please see the history of present illness.    No Fever, chills  or productive cough All other systems reviewed and are negative.   Recent Labs: 03/12/2018: TSH 1.580 09/22/2018: ALT 19 09/23/2018: Hemoglobin 8.9; Platelets 179 09/24/2018: BUN 6; Creatinine, Ser 0.88; Potassium 3.4; Sodium 140   Recent Lipid Panel Lab Results  Component Value Date/Time   CHOL 109 03/12/2018 08:43 AM   TRIG 77 03/12/2018 08:43 AM   HDL 67 03/12/2018 08:43 AM   CHOLHDL 1.6 03/12/2018 08:43 AM   CHOLHDL 2.5 07/30/2016 08:51 AM   LDLCALC 27 03/12/2018 08:43 AM    Wt Readings from Last 3 Encounters:  11/03/18 130 lb (59 kg)  09/24/18 129 lb 6.4 oz (58.7 kg)  03/12/18 134 lb (60.8 kg)     Objective:    Vital Signs:  Ht 5\' 2"  (1.575 m)   Wt 130 lb (59 kg)   BMI 23.78 kg/m    VITAL SIGNS:  reviewed NAD Answers questions appropriately Normal affect Remainder of physical examination not performed (telehealth visit; coronavirus pandemic)  ASSESSMENT & PLAN:    1. CAD-plan to continue medical therapy including aspirin and statin. 2. Mild to moderate aortic insufficiency-echocardiogram ordered at last office visit but not performed.  We will reschedule. 3. Hyperlipidemia-continue statin.  Lipids  and liver monitored by primary care. 4. Hypertension-follow blood pressure and adjust medications as needed.  COVID-19 Education: The importance of social distancing was discussed today.  Time:   Today, I have spent 18 minutes with the patient with telehealth technology discussing the above problems.     Medication Adjustments/Labs and Tests Ordered: Current medicines are reviewed at length with the patient today.  Concerns regarding medicines are outlined above.   Tests Ordered: No orders of the defined types were placed in this encounter.   Medication Changes: No orders of the defined types were placed in this encounter.   Follow Up:  Virtual Visit or In Person in 1 year(s)  Signed, Kirk Ruths, MD  11/03/2018 8:46 AM    [place]

## 2018-11-03 ENCOUNTER — Telehealth: Payer: Self-pay | Admitting: Cardiology

## 2018-11-03 ENCOUNTER — Telehealth (INDEPENDENT_AMBULATORY_CARE_PROVIDER_SITE_OTHER): Payer: Managed Care, Other (non HMO) | Admitting: Cardiology

## 2018-11-03 ENCOUNTER — Encounter: Payer: Self-pay | Admitting: Cardiology

## 2018-11-03 VITALS — Ht 62.0 in | Wt 130.0 lb

## 2018-11-03 DIAGNOSIS — E7800 Pure hypercholesterolemia, unspecified: Secondary | ICD-10-CM | POA: Diagnosis not present

## 2018-11-03 DIAGNOSIS — I351 Nonrheumatic aortic (valve) insufficiency: Secondary | ICD-10-CM

## 2018-11-03 DIAGNOSIS — I2510 Atherosclerotic heart disease of native coronary artery without angina pectoris: Secondary | ICD-10-CM | POA: Diagnosis not present

## 2018-11-03 NOTE — Patient Instructions (Signed)
Medication Instructions:  The current medical regimen is effective;  continue present plan and medications.  If you need a refill on your cardiac medications before your next appointment, please call your pharmacy.   Testing/Procedures: Echocardiogram - Your physician has requested that you have an echocardiogram. Echocardiography is a painless test that uses sound waves to create images of your heart. It provides your doctor with information about the size and shape of your heart and how well your heart's chambers and valves are working. This procedure takes approximately one hour. There are no restrictions for this procedure. This will be performed at our [facility] location - [address].  Follow-Up: At [facility], you and your health needs are our priority.  As part of our continuing mission to provide you with exceptional heart care, we have created designated Provider Care Teams.  These Care Teams include your primary Cardiologist (physician) and Advanced Practice Providers (APPs -  Physician Assistants and Nurse Practitioners) who all work together to provide you with the care you need, when you need it. You will need a follow up appointment in 12 months.  Please call our office 2 months in advance to schedule this appointment.  You may see Dr.Crenshaw or one of the following Advanced Practice Providers on your designated Care Team:   Kerin Ransom, PA-C Roby Lofts, [place] . Sande Rives, PA-C

## 2018-11-03 NOTE — Telephone Encounter (Signed)
LVM for patient to call back and schedule her echo.

## 2018-11-11 ENCOUNTER — Other Ambulatory Visit: Payer: Self-pay | Admitting: Physician Assistant

## 2018-11-11 DIAGNOSIS — M8580 Other specified disorders of bone density and structure, unspecified site: Secondary | ICD-10-CM

## 2019-03-10 ENCOUNTER — Other Ambulatory Visit (HOSPITAL_COMMUNITY): Payer: Self-pay

## 2019-04-13 ENCOUNTER — Other Ambulatory Visit: Payer: Self-pay

## 2019-04-13 ENCOUNTER — Ambulatory Visit (HOSPITAL_COMMUNITY): Payer: BLUE CROSS/BLUE SHIELD | Attending: Cardiology

## 2019-04-13 DIAGNOSIS — E7800 Pure hypercholesterolemia, unspecified: Secondary | ICD-10-CM | POA: Diagnosis present

## 2019-04-13 DIAGNOSIS — I351 Nonrheumatic aortic (valve) insufficiency: Secondary | ICD-10-CM

## 2019-04-13 DIAGNOSIS — I2510 Atherosclerotic heart disease of native coronary artery without angina pectoris: Secondary | ICD-10-CM

## 2019-04-14 ENCOUNTER — Other Ambulatory Visit: Payer: Self-pay | Admitting: *Deleted

## 2019-04-14 DIAGNOSIS — I351 Nonrheumatic aortic (valve) insufficiency: Secondary | ICD-10-CM

## 2019-10-07 ENCOUNTER — Telehealth: Payer: Self-pay | Admitting: *Deleted

## 2019-10-07 NOTE — Telephone Encounter (Signed)
Casey Huerta [employer] is out of network,recall removed.

## 2020-02-08 ENCOUNTER — Telehealth: Payer: Self-pay | Admitting: General Practice

## 2020-02-08 NOTE — Telephone Encounter (Signed)
Patient states her spouse  THIA OLESEN) sees you as pcp and she would like to establish care as well with you   please advise

## 2020-02-08 NOTE — Telephone Encounter (Signed)
Yes. I will see her.

## 2020-02-22 NOTE — Progress Notes (Addendum)
[place] [facility] [place], [place], [place] [address] [phone]  Date:  02/24/2020   Name:  Casey Huerta   DOB:  01-13-1956   MRN:  [id number]  PCP:  Darreld Mclean, MD    Chief Complaint: New Patient (Initial Visit) (Establish care/) and Joint Pain (All over body pain at all times, right wrist pain )   History of Present Illness:  Casey Huerta is a 65 y.o. very pleasant female patient who presents with the following:  Woman with history of CABG, dyslipidemia, here today as a new patient to establish care Her husband is my patient, I also know her late sister who was a longtime employee at [employer] [facility]  She is from [place]- she is retired from her work with customer service  She had cardiac bypass surgery in 2001 She does have cardiology care with [facility] but "does not see him that much" She does not have any chest pain  She ran out of crestor- has not taken in a long time, perhaps a year or longer She does not have a PCP at this time Her last physical was "a while ago" She is taking amlodipine still   She had been a pt at a [facility] but is not currently attending, she got frustrated by frequent visit requirement  Casey Huerta is a 65 y.o. old female presenting today for follow up visit. She has a history of chronic pain over multiple joints, greatest in the bilateral knees and in her right wrist. She has undergone x-rays of her bilateral knees but we do not have access to imaging for this. She remains interested in medication management for this. At her last visit I prescribed her Lyrica, she notes mild benefit from this but developed swelling of the bilateral upper extremities and discontinued this medication is resolved. At her last visit she also underwent a UDS which was positive for hydromorphone (metabolite of hydrocodone) and tramadol, she notes that she had used a few  pills of tramadol from a previous prescription resulting in this positive result. I noted that we would need to continue a monotherapy with one opioid medication to prescribe this class of pain medications for her which she was in agreement with. As she has used hydrocodone infrequently in the past, I noted that I would be okay with prescribing this for her for occasional days of significant pain. I noted that I would also prefer to start her on an appropriate maintenance medication for her day-to-day pain, we discussed the risk and benefits of use of Cymbalta and I described the mechanism of action for this medication in terms of pain relief, and she is interested in utilizing this. I will plan on seeing her in 4 weeks for review of medications and discussion of interventional treatments including intra-articular knee injections which may be indicated in the setting of her persistent knee pain. I also noted that would be happy to place a referral for orthopedic surgery of the hand for evaluation of her persistent right wrist pain and suspected carpal tunnel syndrome, but she notes that she wishes to utilize a wrist brace which she had used for this pain in the past with success.  -I will start Cymbalta 60 mg daily, she will titrate from 30 mg daily for 7 days before increasing to a maintenance dose of 60 mg daily -I have agreed to provide her with a  limited prescription of hydrocodone 5/325 Rx #15 for a 30-day period. I noted that I would not plan on dose escalating this medication and cautioned her to utilize this on a purely as-needed basis -Narcan prescription provided -Consider intra-articular knee injection in the future -Consider referral to hand surgery for evaluation of right wrist/hand pain, potential carpal tunnel syndrome  She notes pains in her joints- in her bilateral knees, her right base of thumb- and also the left trapezius muscle  She last saw ortho for her hands perhaps 10 years ago  Pt  notes that she did not use the cymbalta as she could not swallow it She reports already using Voltaren gel on her joints, this is somewhat helpful She also was using Celebrex about 1 year ago, noted that it was helpful but she does not currently have a prescription  I do not see any evidence of evaluation of rheumatoid arthritis There is an x-ray of her left knee on chart from 2018, it showed moderate to severe osteoarthritis changes  Last mammo 2018- need to update covid and booster done Flu shot done   She is on SSD for her heart and her knee OA per her report     Patient Active Problem List   Diagnosis Date Noted  . UGIB (upper gastrointestinal bleed) 09/22/2018  . Chest pain with moderate risk of acute coronary syndrome 01/16/2017  . Osteoarthritis of both knees 06/14/2016  . Breast mass 05/17/2016  . Aortic insufficiency 03/06/2012  . Dyslipidemia 06/19/2007  . Essential hypertension 06/19/2007  . Hx of CABG 06/19/2007    Past Medical History:  Diagnosis Date  . Aortic insufficiency    a. Mild-mod AI by echo 2010.  . Breast mass 05/17/2016   BIRADS 3: biopsy revealed benign fibroadenoma  . COPD   . CORONARY ARTERY DISEASE    a. CABG (LIMA-->LAD 2001). b. Myoview 2008 no ischemia, EF 63%.  c. cath 10/14/2013 mild 20% ost LAD, atretic LIMA, EF 55%, medical therapy  . Hematemesis 09/2018  . HYPERLIPIDEMIA   . HYPERTENSION   . MENORRHAGIA     Past Surgical History:  Procedure Laterality Date  . arthroscopic knee surgery    . BIOPSY  09/23/2018   Procedure: BIOPSY;  Surgeon: Juanita Craver, MD;  Location: [facility] ENDOSCOPY;  Service: Endoscopy;;  . CESAREAN SECTION    . CORONARY ARTERY BYPASS GRAFT    . ESOPHAGOGASTRODUODENOSCOPY (EGD) WITH PROPOFOL Left 09/23/2018   Procedure: ESOPHAGOGASTRODUODENOSCOPY (EGD) WITH PROPOFOL;  Surgeon: Juanita Craver, MD;  Location: [facility] ENDOSCOPY;  Service: Endoscopy;  Laterality: Left;  . LEFT HEART CATH AND CORONARY ANGIOGRAPHY N/A 01/17/2017    Procedure: LEFT HEART CATH AND CORONARY ANGIOGRAPHY;  Surgeon: Belva Crome, MD;  Location: [facility] CV LAB;  Service: Cardiovascular;  Laterality: N/A;  . LEFT HEART CATHETERIZATION WITH CORONARY/GRAFT ANGIOGRAM N/A 10/12/2013   Procedure: LEFT HEART CATHETERIZATION WITH Beatrix Fetters;  Surgeon: Troy Sine, MD;  Location: [facility] CATH LAB;  Service: Cardiovascular;  Laterality: N/A;    Social History   Tobacco Use  . Smoking status: Former Smoker    Quit date: 1980    Years since quitting: 42.0  . Smokeless tobacco: Never Used  . Tobacco comment: QUIT SMOKING IN 1999  Vaping Use  . Vaping Use: Never used  Substance Use Topics  . Alcohol use: Yes    Comment: friday nights - 2 glasses - rare  . Drug use: No    Comment:  quit using in 1985  Family History  Problem Relation Age of Onset  . Heart disease Mother   . Heart disease Sister   . Cancer Sister     Allergies  Allergen Reactions  . Simvastatin Other (See Comments)    REACTION: constipation    Medication list has been reviewed and updated.  Current Outpatient Medications on File Prior to Visit  Medication Sig Dispense Refill  . ALPRAZolam (XANAX) 0.25 MG tablet Take 1 tablet (0.25 mg total) by mouth daily as needed for anxiety. 20 tablet 0  . aspirin 81 MG chewable tablet Chew 1 tablet (81 mg total) by mouth daily. 90 tablet 4  . Multiple Vitamins-Minerals (MULTIVITAMIN WITH MINERALS) tablet Take 1 tablet by mouth daily.    . nitroGLYCERIN (NITROSTAT) 0.4 MG SL tablet Place 1 tablet (0.4 mg total) under the tongue every 5 (five) minutes x 3 doses as needed for chest pain. 25 tablet 1  . amLODipine (NORVASC) 5 MG tablet Take 5 mg by mouth daily.    . rosuvastatin (CRESTOR) 20 MG tablet Take 1 tablet (20 mg total) by mouth daily. (Patient not taking: Reported on 02/24/2020) 90 tablet 3  . traMADol (ULTRAM) 50 MG tablet Take 50 mg by mouth at bedtime. Do not take with Xanax! (Patient not taking: Reported on  02/24/2020)     No current facility-administered medications on file prior to visit.    Review of Systems:  As per HPI- otherwise negative.   Physical Examination: Vitals:   02/24/20 1103  BP: 132/82  Pulse: 82  Resp: 17  SpO2: 99%   Vitals:   02/24/20 1103  Weight: 133 lb (60.3 kg)  Height: 5\' 2"  (1.575 m)   Body mass index is 24.33 kg/m. Ideal Body Weight: Weight in (lb) to have BMI = 25: 136.4  GEN: no acute distress.  Normal weight, looks well HEENT: Atraumatic, Normocephalic.  Ears and Nose: No external deformity. CV: RRR, No M/G/R. No JVD. No thrill. No extra heart sounds. PULM: CTA B, no wheezes, crackles, rhonchi. No retractions. No resp. distress. No accessory muscle use. ABD: S, NT, ND, +BS. No rebound. No HSM. EXTR: No c/c/e PSYCH: Normally interactive. Conversant.  She has tenderness in her left trapezius muscle  Assessment and Plan: Chronic pain of both knees - Plan: Ambulatory referral to Orthopedic Surgery, CBC, Sedimentation rate, C-reactive protein, Rheumatoid Factor, celecoxib (CELEBREX) 100 MG capsule  Pure hypercholesterolemia - Plan: Lipid panel, rosuvastatin (CRESTOR) 20 MG tablet, DISCONTINUED: rosuvastatin (CRESTOR) 20 MG tablet  Chronic pain of right thumb - Plan: Ambulatory referral to Orthopedic Surgery, Sedimentation rate, C-reactive protein, Rheumatoid Factor, celecoxib (CELEBREX) 100 MG capsule  Immunization due - Plan: Varicella-zoster vaccine IM (Shingrix)  Screening for thyroid disorder - Plan: TSH  Fatigue, unspecified type - Plan: CBC, TSH, VITAMIN D 25 Hydroxy (Vit-D Deficiency, Fractures)  Screening for diabetes mellitus - Plan: Comprehensive metabolic panel, Hemoglobin A1c  History of anemia - Plan: CBC  Encounter for screening mammogram for malignant neoplasm of breast - Plan: MM 3D SCREEN BREAST BILATERAL  Patient today to establish care and discuss a few concerns She has chronic pain, mostly in her knees and her right  first MCP joint.  She was going to a pain management clinic and was receiving narcotic pain medication.  However, she no longer wishes to go to pain management.  I explained that I would like to make an attempt to treat the source of her pain-symptoms like osteoarthritis-with alternatives to narcotics if we can.  She does  have known history of moderate to severe osteoarthritis of her knees.  Referral back to orthopedics, refilled her Celebrex  Start back on Crestor, explained the importance of using this medication regularly as she is status post CABG  Gave second dose of Shingrix  Patient notes fatigue, check vitamin D and thyroid  Other routine labs pending as above  Ordered mammogram This visit occurred during the SARS-CoV-2 public health emergency.  Safety protocols were in place, including screening questions prior to the visit, additional usage of staff PPE, and extensive cleaning of exam room while observing appropriate contact time as indicated for disinfecting solutions.    Signed Lamar Blinks, MD  Received her labs 1/10- message to pt  Results for orders placed or performed in visit on 02/24/20  CBC  Result Value Ref Range   WBC 4.5 4.0 - 10.5 K/uL   RBC 4.68 3.87 - 5.11 Mil/uL   Platelets 344.0 150.0 - 400.0 K/uL   Hemoglobin 13.8 12.0 - 15.0 g/dL   HCT 42.4 36.0 - 46.0 %   MCV 90.6 78.0 - 100.0 fl   MCHC 32.6 30.0 - 36.0 g/dL   RDW 13.1 11.5 - 15.5 %  Comprehensive metabolic panel  Result Value Ref Range   Sodium 139 135 - 145 mEq/L   Potassium 5.0 3.5 - 5.1 mEq/L   Chloride 103 96 - 112 mEq/L   CO2 32 19 - 32 mEq/L   Glucose, Bld 75 70 - 99 mg/dL   BUN 9 6 - 23 mg/dL   Creatinine, Ser 1.02 0.40 - 1.20 mg/dL   Total Bilirubin 0.4 0.2 - 1.2 mg/dL   Alkaline Phosphatase 98 39 - 117 U/L   AST 15 0 - 37 U/L   ALT 11 0 - 35 U/L   Total Protein 7.3 6.0 - 8.3 g/dL   Albumin 4.3 3.5 - 5.2 g/dL   GFR 58.60 (L) >60.00 mL/min   Calcium 10.0 8.4 - 10.5 mg/dL   Hemoglobin A1c  Result Value Ref Range   Hgb A1c MFr Bld 5.7 4.6 - 6.5 %  Lipid panel  Result Value Ref Range   Cholesterol 201 (H) 0 - 200 mg/dL   Triglycerides 132.0 0.0 - 149.0 mg/dL   HDL 60.00 >39.00 mg/dL   VLDL 26.4 0.0 - 40.0 mg/dL   LDL Cholesterol 115 (H) 0 - 99 mg/dL   Total CHOL/HDL Ratio 3    NonHDL 141.26   TSH  Result Value Ref Range   TSH 1.42 0.35 - 4.50 uIU/mL  VITAMIN D 25 Hydroxy (Vit-D Deficiency, Fractures)  Result Value Ref Range   VITD 35.20 30.00 - 100.00 ng/mL  Sedimentation rate  Result Value Ref Range   Sed Rate 16 0 - 30 mm/hr  C-reactive protein  Result Value Ref Range   CRP <1.0 0.5 - 20.0 mg/dL  Rheumatoid Factor  Result Value Ref Range   Rhuematoid fact SerPl-aCnc 26 (H) <14 IU/mL

## 2020-02-23 ENCOUNTER — Other Ambulatory Visit: Payer: Self-pay

## 2020-02-24 ENCOUNTER — Encounter: Payer: Self-pay | Admitting: Family Medicine

## 2020-02-24 ENCOUNTER — Ambulatory Visit (INDEPENDENT_AMBULATORY_CARE_PROVIDER_SITE_OTHER): Payer: 59 | Admitting: Family Medicine

## 2020-02-24 VITALS — BP 132/82 | HR 82 | Resp 17 | Ht 62.0 in | Wt 133.0 lb

## 2020-02-24 DIAGNOSIS — G8929 Other chronic pain: Secondary | ICD-10-CM

## 2020-02-24 DIAGNOSIS — M79644 Pain in right finger(s): Secondary | ICD-10-CM

## 2020-02-24 DIAGNOSIS — R5383 Other fatigue: Secondary | ICD-10-CM | POA: Diagnosis not present

## 2020-02-24 DIAGNOSIS — Z131 Encounter for screening for diabetes mellitus: Secondary | ICD-10-CM | POA: Diagnosis not present

## 2020-02-24 DIAGNOSIS — Z862 Personal history of diseases of the blood and blood-forming organs and certain disorders involving the immune mechanism: Secondary | ICD-10-CM

## 2020-02-24 DIAGNOSIS — M25562 Pain in left knee: Secondary | ICD-10-CM | POA: Diagnosis not present

## 2020-02-24 DIAGNOSIS — R7303 Prediabetes: Secondary | ICD-10-CM

## 2020-02-24 DIAGNOSIS — Z1231 Encounter for screening mammogram for malignant neoplasm of breast: Secondary | ICD-10-CM

## 2020-02-24 DIAGNOSIS — Z1329 Encounter for screening for other suspected endocrine disorder: Secondary | ICD-10-CM

## 2020-02-24 DIAGNOSIS — M25561 Pain in right knee: Secondary | ICD-10-CM

## 2020-02-24 DIAGNOSIS — E7800 Pure hypercholesterolemia, unspecified: Secondary | ICD-10-CM | POA: Diagnosis not present

## 2020-02-24 DIAGNOSIS — Z23 Encounter for immunization: Secondary | ICD-10-CM | POA: Diagnosis not present

## 2020-02-24 LAB — LIPID PANEL
Cholesterol: 201 mg/dL — ABNORMAL HIGH (ref 0–200)
HDL: 60 mg/dL (ref >39.00)
LDL Cholesterol: 115 mg/dL — ABNORMAL HIGH (ref 0–99)
NonHDL: 141.26
Total CHOL/HDL Ratio: 3
Triglycerides: 132 mg/dL (ref 0.0–149.0)
VLDL: 26.4 mg/dL (ref 0.0–40.0)

## 2020-02-24 LAB — CBC
HCT: 42.4 % (ref 36.0–46.0)
Hemoglobin: 13.8 g/dL (ref 12.0–15.0)
MCHC: 32.6 g/dL (ref 30.0–36.0)
MCV: 90.6 fl (ref 78.0–100.0)
Platelets: 344 10*3/uL (ref 150.0–400.0)
RBC: 4.68 Mil/uL (ref 3.87–5.11)
RDW: 13.1 % (ref 11.5–15.5)
WBC: 4.5 10*3/uL (ref 4.0–10.5)

## 2020-02-24 LAB — COMPREHENSIVE METABOLIC PANEL
ALT: 11 U/L (ref 0–35)
AST: 15 U/L (ref 0–37)
Albumin: 4.3 g/dL (ref 3.5–5.2)
Alkaline Phosphatase: 98 U/L (ref 39–117)
BUN: 9 mg/dL (ref 6–23)
CO2: 32 mEq/L (ref 19–32)
Calcium: 10 mg/dL (ref 8.4–10.5)
Chloride: 103 mEq/L (ref 96–112)
Creatinine, Ser: 1.02 mg/dL (ref 0.40–1.20)
GFR: 58.6 mL/min — ABNORMAL LOW (ref >60.00)
Glucose, Bld: 75 mg/dL (ref 70–99)
Potassium: 5 mEq/L (ref 3.5–5.1)
Sodium: 139 mEq/L (ref 135–145)
Total Bilirubin: 0.4 mg/dL (ref 0.2–1.2)
Total Protein: 7.3 g/dL (ref 6.0–8.3)

## 2020-02-24 LAB — HEMOGLOBIN A1C: Hgb A1c MFr Bld: 5.7 % (ref 4.6–6.5)

## 2020-02-24 LAB — VITAMIN D 25 HYDROXY (VIT D DEFICIENCY, FRACTURES): VITD: 35.2 ng/mL (ref 30.00–100.00)

## 2020-02-24 LAB — C-REACTIVE PROTEIN: CRP: <1 mg/dL (ref 0.5–20.0)

## 2020-02-24 LAB — SEDIMENTATION RATE: Sed Rate: 16 mm/hr (ref 0–30)

## 2020-02-24 LAB — TSH: TSH: 1.42 u[IU]/mL (ref 0.35–4.50)

## 2020-02-24 MED ORDER — ROSUVASTATIN CALCIUM 20 MG PO TABS: 20.0000 mg | ORAL_TABLET | Freq: Every day | ORAL | 3 refills | Status: DC

## 2020-02-24 MED ORDER — CELECOXIB 100 MG PO CAPS: 100.0000 mg | ORAL_CAPSULE | Freq: Two times a day (BID) | ORAL | 5 refills | Status: DC

## 2020-02-24 NOTE — Patient Instructions (Signed)
It was nice to meet you today You got your 2nd dose of shingrix today Please stop by the lab for a blood draw, and then go to imaging on the ground floor to schedule your mammogram I will be in touch with your labs- we are going to try and determine if there is any other explanation (besides osteoarthritis) for your joint pains I am also going to have you see orthopedics to look at your knees and thumb- I do suspect a steroid injection may help I would recommend that you try Voltaren gel (topical NSAID, apply topically) for your knee and thumb pain

## 2020-02-25 LAB — RHEUMATOID FACTOR: Rheumatoid fact SerPl-aCnc: 26 IU/mL — ABNORMAL HIGH (ref <14)

## 2020-02-28 ENCOUNTER — Encounter: Payer: Self-pay | Admitting: Family Medicine

## 2020-02-28 DIAGNOSIS — R7303 Prediabetes: Secondary | ICD-10-CM | POA: Insufficient documentation

## 2020-03-02 ENCOUNTER — Encounter: Payer: Self-pay | Admitting: *Deleted

## 2020-03-13 ENCOUNTER — Encounter (HOSPITAL_BASED_OUTPATIENT_CLINIC_OR_DEPARTMENT_OTHER): Payer: Self-pay

## 2020-03-13 ENCOUNTER — Other Ambulatory Visit: Payer: Self-pay

## 2020-03-13 ENCOUNTER — Ambulatory Visit (HOSPITAL_BASED_OUTPATIENT_CLINIC_OR_DEPARTMENT_OTHER)
Admission: RE | Admit: 2020-03-13 | Discharge: 2020-03-13 | Disposition: A | Discharge status: Home / Self Care | Payer: 59 | Source: Ambulatory Visit | Attending: Family Medicine | Admitting: Family Medicine

## 2020-03-13 DIAGNOSIS — Z1231 Encounter for screening mammogram for malignant neoplasm of breast: Secondary | ICD-10-CM | POA: Diagnosis not present

## 2020-03-20 ENCOUNTER — Encounter: Payer: Self-pay | Admitting: Family Medicine

## 2020-03-20 ENCOUNTER — Telehealth: Payer: Self-pay

## 2020-03-20 DIAGNOSIS — F430 Acute stress reaction: Secondary | ICD-10-CM

## 2020-03-20 MED ORDER — TRAMADOL HCL 50 MG PO TABS: 50.0000 mg | ORAL_TABLET | Freq: Every day | ORAL | 0 refills | Status: DC

## 2020-03-20 MED ORDER — ALPRAZOLAM 0.25 MG PO TABS: 0.2500 mg | ORAL_TABLET | Freq: Every day | ORAL | 1 refills | Status: DC | PRN

## 2020-03-20 NOTE — Telephone Encounter (Signed)
Nurse Assessment Nurse: Jearld Pies, RN, Lovena Le Date/Time Eilene Ghazi Time): 03/19/2020 2:32:47 PM Confirm and document reason for call. If symptomatic, describe symptoms. ---Pt has chronic hand pain that is currently 10/10. Pt just took tylenol and has not had time to feel relief. Denies chest pain, difficulty breathing, fever, or any other symptoms at this time. Does the patient have any new or worsening symptoms? ---Yes Will a triage be completed? ---Yes Related visit to physician within the last 2 weeks? ---No Does the PT have any chronic conditions? (i.e. diabetes, asthma, this includes High risk factors for pregnancy, etc.) ---Yes List chronic conditions. ---HTN, High cholesterol Is this a behavioral health or substance abuse call? ---No Nurse: Jearld Pies, RN, Lovena Le Date/Time (Eastern Time): 03/19/2020 2:30:23 PM Please select the assessment type ---Request for controlled medication refill Additional Documentation ---Xanax and Tramadol due for refill tomorrow. Prescribing doctor. Dr. Isidoro Donning. Pt recently switched to Dr. Lorelei Pont. Is there an on-call physician for the client? ---Yes Do the client directives specifically allow for paging the on-call regarding scheduled drugs? ---No Additional Documentation ---Requested pharmacy [facility] @ [phone]. Advised pt unable to refill scheduled drugs after hours. Advised to follow dispo advice. Advised to call back for any new or worsening symptoms. Pt verbalizes understanding. PLEASE NOTE: All timestamps contained within this report are represented as [place] Standard Time. CONFIDENTIALTY NOTICE: This fax transmission is intended only for the addressee. It contains information that is legally privileged, confidential or otherwise protected from use or disclosure. If you are not the intended recipient, you are strictly prohibited from reviewing, disclosing, copying using or disseminating any of this information or taking any action in reliance on or  regarding this information. If you have received this fax in error, please notify us immediately by telephone so that we can arrange for its return to [place]. Phone: [phone], Toll-Free: [phone], Fax: [phone] Page: 2 of 2 Call Id: [id number] Guidelines Guideline Title Affirmed Question Affirmed Notes Nurse Date/Time Eilene Ghazi Time) Hand and Wrist Pain [1] MODERATE pain (e.g., interferes with normal activities) AND [2] present > 3 days Jake Bathe 03/19/2020 2:34:59 PM Disp. Time Eilene Ghazi Time) Disposition Final User 03/19/2020 2:36:45 PM SEE PCP WITHIN 3 DAYS Yes Jearld Pies, RN, Apolonio Schneiders Disagree/Comply Comply Caller Understands Yes PreDisposition Call Doctor Care Advice Given Per Guideline SEE PCP WITHIN 3 DAYS: * You become worse * Signs of infection occur (e.g., spreading redness, warmth, fever) CALL BACK IF: Referrals REFERRED TO PCP OFFICE

## 2020-03-20 NOTE — Telephone Encounter (Signed)
Patient called in reference to medication refills, patient would like to know the status of xanax and tramadol. Patient states she seen Dr Lorelei Pont recently and did not mention these medications.   Please Advise

## 2020-03-20 NOTE — Telephone Encounter (Signed)
Please advise on refill of pended medications. You do not manage these prescriptions.

## 2020-03-28 ENCOUNTER — Ambulatory Visit: Payer: 59 | Admitting: Orthopaedic Surgery

## 2020-03-28 ENCOUNTER — Other Ambulatory Visit: Payer: Self-pay | Admitting: *Deleted

## 2020-03-28 DIAGNOSIS — I351 Nonrheumatic aortic (valve) insufficiency: Secondary | ICD-10-CM

## 2020-04-18 ENCOUNTER — Telehealth (HOSPITAL_COMMUNITY): Payer: Self-pay | Admitting: Cardiology

## 2020-04-18 NOTE — Telephone Encounter (Signed)
Patient called and cancelled echocardiogram for 04/21/20 and did not wish to reschedule. Order will be removed from the Echo WQ.

## 2020-04-19 ENCOUNTER — Other Ambulatory Visit (HOSPITAL_COMMUNITY): Payer: 59

## 2020-05-08 ENCOUNTER — Other Ambulatory Visit: Payer: Self-pay | Admitting: Family Medicine

## 2020-05-08 MED ORDER — TRAMADOL HCL 50 MG PO TABS: 50.0000 mg | ORAL_TABLET | Freq: Every day | ORAL | 0 refills | Status: DC

## 2020-05-14 ENCOUNTER — Encounter: Payer: Self-pay | Admitting: Family Medicine

## 2020-05-14 DIAGNOSIS — F430 Acute stress reaction: Secondary | ICD-10-CM

## 2020-05-15 ENCOUNTER — Encounter: Payer: Self-pay | Admitting: Family Medicine

## 2020-05-15 MED ORDER — ALPRAZOLAM 0.25 MG PO TABS: 0.2500 mg | ORAL_TABLET | Freq: Every day | ORAL | 1 refills | Status: DC | PRN

## 2020-05-16 ENCOUNTER — Telehealth: Payer: Self-pay

## 2020-05-16 NOTE — Telephone Encounter (Signed)
Aprazolam was sent yesterday by pcp.

## 2020-05-16 NOTE — Telephone Encounter (Signed)
Nurse Assessment Nurse: Waymond Cera, RN, Benjamine Mola Date/Time (Eastern Time): 05/15/2020 6:54:49 PM Please select the assessment type ---Request for controlled medication refill Additional Documentation ---Pharmacy requesting to refill Xanax early. Advised that controlled substances are not dealt with after hours. Advised that I would send a note in to the office. Verbalizes understanding. States that she can fill it tomorrow. States pt was just wanting medication tonight. Is there an on-call physician for the client? ---Yes Do the client directives specifically allow for paging the on-call regarding scheduled drugs? ---No Disp. Time Eilene Ghazi Time) Disposition Final User 05/15/2020 6:57:24 PM Pharmacy Call [place], RN, Benjamine Mola Reason: [facility] pharmacy back. See med assessment. 05/15/2020 6:58:21 PM Clinical Call Yes [place], RN, Benjamine Mola

## 2020-06-07 ENCOUNTER — Encounter: Payer: Self-pay | Admitting: Family Medicine

## 2020-06-07 ENCOUNTER — Other Ambulatory Visit: Payer: Self-pay | Admitting: Family Medicine

## 2020-06-07 MED ORDER — TRAMADOL HCL 50 MG PO TABS: 50.0000 mg | ORAL_TABLET | Freq: Every day | ORAL | 0 refills | Status: DC

## 2020-06-26 ENCOUNTER — Other Ambulatory Visit: Payer: Self-pay | Admitting: Family Medicine

## 2020-06-26 ENCOUNTER — Encounter: Payer: Self-pay | Admitting: Family Medicine

## 2020-07-01 NOTE — Progress Notes (Addendum)
[facility] [address], [place], [place] [address] [phone]  Date:  07/05/2020   Name:  Casey Huerta   DOB:  11/04/1955   MRN:  [id number]  PCP:  Darreld Mclean, MD    Chief Complaint: Hyperlipidemia (Discuss medication-cost of crestor)   History of Present Illness:  Casey Huerta is a 65 y.o. very pleasant female patient who presents with the following:  Patient here today for follow-up of hyperlipidemia She had messaged [place] earlier with concern about cost of Crestor-there is some confusion about exactly what prescription she tried to fill and the cost, but it sounds as though she went to fill Crestor and it was about $25, she was not sure if she was supposed to be still taking this  We discussed, she is willing to continue Crestor which I have refilled for her.  I changed her prescription to 90 days and felt that it was safe for money.  If she needs another option for affordability she will let me know History of CABG and dyslipidemia Cardiac bypass surgery in 2001  Labs done in January of this year  Cologuard now due  Shingrix is complete Recommend fourth dose of COVID-19  Her main concern today is actually chronic pain-she has been treated with tramadol once daily for some time.  She is currently on 50 mg once a day, reports that she previously tried 100 mg but it was not more helpful. She also reports using hydrocodone at times in the past  Pt notes intermittent pain in her left shoulder/ neck She has arthritis in her left knee, x-ray done in 2018 as follows IMPRESSION: Moderate severe arthritis of the left knee with trace suprapatellar effusion. No acute osseous abnormality  She also notes lower back pain which is an issue fairly chronically  She notes that she does use celebrex but not on a regular basis as it may cause her stomach to hurt She may use tyelnol at times - it helps sometimes  but does not relieve her pain She is also using voltaren gel as needed  She admits to being under some stress, her daughter who is in her 6s is pregnant again.  Mariame fears that she and her husband will be in charge of raising this child  Patient Active Problem List   Diagnosis Date Noted   Prediabetes 02/28/2020   UGIB (upper gastrointestinal bleed) 09/22/2018   Chest pain with moderate risk of acute coronary syndrome 01/16/2017   Osteoarthritis of both knees 06/14/2016   Breast mass 05/17/2016   Aortic insufficiency 03/06/2012   Dyslipidemia 06/19/2007   Essential hypertension 06/19/2007   Hx of CABG 06/19/2007    Past Medical History:  Diagnosis Date   Aortic insufficiency    a. Mild-mod AI by echo 2010.   Breast mass 05/17/2016   BIRADS 3: biopsy revealed benign fibroadenoma   COPD    CORONARY ARTERY DISEASE    a. CABG (LIMA-->LAD 2001). b. Myoview 2008 no ischemia, EF 63%.  c. cath 10/14/2013 mild 20% ost LAD, atretic LIMA, EF 55%, medical therapy   Hematemesis 09/2018   HYPERLIPIDEMIA    HYPERTENSION    MENORRHAGIA     Past Surgical History:  Procedure Laterality Date   arthroscopic knee surgery     BIOPSY  09/23/2018   Procedure: BIOPSY;  Surgeon: Juanita Craver, MD;  Location: [facility];  Service: Endoscopy;;   BREAST BIOPSY Right  05/28/2016   HYALINIZED FIBROADENOMA   CESAREAN SECTION     CORONARY ARTERY BYPASS GRAFT     ESOPHAGOGASTRODUODENOSCOPY (EGD) WITH PROPOFOL Left 09/23/2018   Procedure: ESOPHAGOGASTRODUODENOSCOPY (EGD) WITH PROPOFOL;  Surgeon: Juanita Craver, MD;  Location: [facility] ENDOSCOPY;  Service: Endoscopy;  Laterality: Left;   LEFT HEART CATH AND CORONARY ANGIOGRAPHY N/A 01/17/2017   Procedure: LEFT HEART CATH AND CORONARY ANGIOGRAPHY;  Surgeon: Belva Crome, MD;  Location: [facility] CV LAB;  Service: Cardiovascular;  Laterality: N/A;   LEFT HEART CATHETERIZATION WITH CORONARY/GRAFT ANGIOGRAM N/A 10/12/2013   Procedure: LEFT HEART CATHETERIZATION WITH  Beatrix Fetters;  Surgeon: Troy Sine, MD;  Location: [facility] CATH LAB;  Service: Cardiovascular;  Laterality: N/A;    Social History   Tobacco Use   Smoking status: Former Smoker    Quit date: 1980    Years since quitting: 42.4   Smokeless tobacco: Never Used   Tobacco comment: QUIT SMOKING IN 1999  Vaping Use   Vaping Use: Never used  Substance Use Topics   Alcohol use: Yes    Comment: friday nights - 2 glasses - rare   Drug use: No    Comment:  quit using in 1985    Family History  Problem Relation Age of Onset   Heart disease Mother    Heart disease Sister    Cancer Sister     Allergies  Allergen Reactions   Simvastatin Other (See Comments)    REACTION: constipation    Medication list has been reviewed and updated.  Current Outpatient Medications on File Prior to Visit  Medication Sig Dispense Refill   ALPRAZolam (XANAX) 0.25 MG tablet Take 1 tablet (0.25 mg total) by mouth daily as needed for anxiety. 30 tablet 1   amLODipine (NORVASC) 5 MG tablet Take 5 mg by mouth daily.     aspirin 81 MG chewable tablet Chew 1 tablet (81 mg total) by mouth daily. 90 tablet 4   celecoxib (CELEBREX) 100 MG capsule Take 1 capsule (100 mg total) by mouth 2 (two) times daily. Use as needed for joint pain 60 capsule 5   Multiple Vitamins-Minerals (MULTIVITAMIN WITH MINERALS) tablet Take 1 tablet by mouth daily.     nitroGLYCERIN (NITROSTAT) 0.4 MG SL tablet Place 1 tablet (0.4 mg total) under the tongue every 5 (five) minutes x 3 doses as needed for chest pain. 25 tablet 1   traMADol (ULTRAM) 50 MG tablet TAKE 1 TABLET BY MOUTH EVERY DAY AT BEDTIME DO  NOT  MIX  WITH  XANAX 30 tablet 2   rosuvastatin (CRESTOR) 20 MG tablet Take 20 mg by mouth daily. (Patient not taking: Reported on 07/05/2020)     No current facility-administered medications on file prior to visit.    Review of Systems:  As per HPI- otherwise negative.   Physical Examination: Vitals:   07/05/20 1340   BP: 126/74  Pulse: (!) 55  Resp: 16  Temp: 98.4 F (36.9 C)  SpO2: 98%   Vitals:   07/05/20 1340  Weight: 129 lb (58.5 kg)  Height: 5\' 2"  (1.575 m)   Body mass index is 23.59 kg/m. Ideal Body Weight: Weight in (lb) to have BMI = 25: 136.4  GEN: no acute distress.  Normal weight, looks well  HEENT: Atraumatic, Normocephalic.  Ears and Nose: No external deformity. CV: RRR, No M/G/R. No JVD. No thrill. No extra heart sounds. PULM: CTA B, no wheezes, crackles, rhonchi. No retractions. No resp. distress. No accessory muscle use.  ABD: S, NT, ND, +BS. No rebound. No HSM. EXTR: No c/c/e PSYCH: Normally interactive. Conversant.  The patient notes tenderness with palpation of the left trapezius area, and some pain with range of motion of the left shoulder.  She also notes painless range of motion of her left knee, and with palpation over the lumbar spine   Assessment and Plan: Pure hypercholesterolemia - Plan: rosuvastatin (CRESTOR) 20 MG tablet  Chronic bilateral low back pain without sciatica - Plan: DG Lumbar Spine Complete, DRUG MONITORING, PANEL 8 WITH CONFIRMATION, URINE, HYDROcodone-acetaminophen (NORCO/VICODIN) 5-325 MG tablet  Pain and swelling of left shoulder - Plan: DG Shoulder Left, DRUG MONITORING, PANEL 8 WITH CONFIRMATION, URINE, HYDROcodone-acetaminophen (NORCO/VICODIN) 5-325 MG tablet  Essential hypertension - Plan: amLODipine (NORVASC) 5 MG tablet  Pain in joint of left knee - Plan: DRUG MONITORING, PANEL 8 WITH CONFIRMATION, URINE, HYDROcodone-acetaminophen (NORCO/VICODIN) 5-325 MG tablet   Patient today for follow-up visit.  We discussed her lipids, she is willing to go back on Crestor.  She will let me know if this medication is not affordable for her  We will also have her continue amlodipine for high blood pressure  She is currently taking tramadol at bedtime for various musculoskeletal pain.  She is quite insistent that she wishes to have hydrocodone as  well-she states that her pain is uncontrolled, she has used hydrocodone in the past and it was helpful to her.  She is already taking anti-inflammatories and Tylenol as well as tramadol.  I discussed this with her in detail, and expressed that we need to use caution and use narcotic medication as they can be habit-forming and sedating.  Today we will get x-rays to try and document any evidence of degenerative disease in her painful joints, we will do a drug screen, and I did prescribe her 10 hydrocodone which can be used over the next 30 days as needed for more severe pain-she states understanding and agreement with our plan This visit occurred during the SARS-CoV-2 public health emergency.  Safety protocols were in place, including screening questions prior to the visit, additional usage of staff PPE, and extensive cleaning of exam room while observing appropriate contact time as indicated for disinfecting solutions.    Signed Lamar Blinks, MD  Addendum 6/21  Leanda has been requesting hydrocodone on a regular basis.  I asked her to please detail her pain experience and medication use so that I can understand how she is treating her pain-she responded with the following message  Good morning,My lower back hurts so much sometimes, I can't hardly walk, my shoulder around my neck hurts often, my hand around my wrist hurts, this is the way I feel when I get up in the mornings I try to go without taking pain medicine, but I can't do much when I don't, when I take a pain pill and can get on with my day, I don't have to take the pain medication every day, I try and take it at least 3 to 4 days apart, I do take the other pain pill once a day, I don't mix them

## 2020-07-01 NOTE — Patient Instructions (Addendum)
It was good to see you again today!  We can continue amlodipine for your blood pressure and rosouvastatin for your cholesterol Please continue to use tylenol, voltaren gel, and Celebrex as needed for pain We can have you use tramadol at bedtime for pain You can also use hydrocodone on occasion as needed for more severe pain- do not combine with xanax, tramadol, or alcohol and do not use when you need to drive  I will be in touch with your x-ray reports

## 2020-07-05 ENCOUNTER — Other Ambulatory Visit: Payer: Self-pay

## 2020-07-05 ENCOUNTER — Encounter: Payer: Self-pay | Admitting: Family Medicine

## 2020-07-05 ENCOUNTER — Ambulatory Visit (HOSPITAL_BASED_OUTPATIENT_CLINIC_OR_DEPARTMENT_OTHER)
Admission: RE | Admit: 2020-07-05 | Discharge: 2020-07-05 | Disposition: A | Discharge status: Home / Self Care | Payer: 59 | Source: Ambulatory Visit | Attending: Family Medicine | Admitting: Family Medicine

## 2020-07-05 ENCOUNTER — Ambulatory Visit (INDEPENDENT_AMBULATORY_CARE_PROVIDER_SITE_OTHER): Payer: 59 | Admitting: Family Medicine

## 2020-07-05 VITALS — BP 126/74 | HR 55 | Temp 98.4°F | Resp 16 | Ht 62.0 in | Wt 129.0 lb

## 2020-07-05 DIAGNOSIS — G8929 Other chronic pain: Secondary | ICD-10-CM | POA: Insufficient documentation

## 2020-07-05 DIAGNOSIS — M25512 Pain in left shoulder: Secondary | ICD-10-CM | POA: Insufficient documentation

## 2020-07-05 DIAGNOSIS — I10 Essential (primary) hypertension: Secondary | ICD-10-CM

## 2020-07-05 DIAGNOSIS — M25412 Effusion, left shoulder: Secondary | ICD-10-CM | POA: Diagnosis present

## 2020-07-05 DIAGNOSIS — M545 Low back pain, unspecified: Secondary | ICD-10-CM

## 2020-07-05 DIAGNOSIS — M25562 Pain in left knee: Secondary | ICD-10-CM

## 2020-07-05 DIAGNOSIS — E7800 Pure hypercholesterolemia, unspecified: Secondary | ICD-10-CM | POA: Diagnosis not present

## 2020-07-05 MED ORDER — HYDROCODONE-ACETAMINOPHEN 5-325 MG PO TABS: 1.0000 | ORAL_TABLET | Freq: Three times a day (TID) | ORAL | 0 refills | Status: DC | PRN

## 2020-07-05 MED ORDER — ROSUVASTATIN CALCIUM 20 MG PO TABS: 20.0000 mg | ORAL_TABLET | Freq: Every day | ORAL | 3 refills | Status: DC

## 2020-07-05 MED ORDER — AMLODIPINE BESYLATE 5 MG PO TABS: 5.0000 mg | ORAL_TABLET | Freq: Every day | ORAL | 3 refills | Status: DC

## 2020-07-07 ENCOUNTER — Encounter: Payer: Self-pay | Admitting: Family Medicine

## 2020-07-10 ENCOUNTER — Encounter: Payer: Self-pay | Admitting: Family Medicine

## 2020-07-11 ENCOUNTER — Encounter: Payer: Self-pay | Admitting: Family Medicine

## 2020-07-11 LAB — DRUG MONITORING, PANEL 8 WITH CONFIRMATION, URINE
6 Acetylmorphine: NEGATIVE ng/mL (ref <10)
Alcohol Metabolites: NEGATIVE ng/mL
Alphahydroxyalprazolam: 59 ng/mL — ABNORMAL HIGH (ref <25)
Alphahydroxymidazolam: NEGATIVE ng/mL (ref <50)
Alphahydroxytriazolam: NEGATIVE ng/mL (ref <50)
Aminoclonazepam: NEGATIVE ng/mL (ref <25)
Amphetamines: NEGATIVE ng/mL (ref <500)
Benzodiazepines: POSITIVE ng/mL — AB (ref <100)
Buprenorphine, Urine: NEGATIVE ng/mL (ref <5)
Cocaine Metabolite: NEGATIVE ng/mL (ref <150)
Creatinine: 130.1 mg/dL
Hydroxyethylflurazepam: NEGATIVE ng/mL (ref <50)
Lorazepam: NEGATIVE ng/mL (ref <50)
MDMA: NEGATIVE ng/mL (ref <500)
Marijuana Metabolite: NEGATIVE ng/mL (ref <20)
Nordiazepam: NEGATIVE ng/mL (ref <50)
Opiates: NEGATIVE ng/mL (ref <100)
Oxazepam: NEGATIVE ng/mL (ref <50)
Oxidant: NEGATIVE ug/mL
Oxycodone: NEGATIVE ng/mL (ref <100)
Temazepam: NEGATIVE ng/mL (ref <50)
pH: 5.4 (ref 4.5–9.0)

## 2020-07-11 LAB — DM TEMPLATE

## 2020-07-12 ENCOUNTER — Other Ambulatory Visit: Payer: Self-pay | Admitting: Family Medicine

## 2020-07-12 DIAGNOSIS — F430 Acute stress reaction: Secondary | ICD-10-CM

## 2020-07-24 ENCOUNTER — Encounter: Payer: Self-pay | Admitting: Family Medicine

## 2020-07-24 DIAGNOSIS — M25562 Pain in left knee: Secondary | ICD-10-CM

## 2020-07-24 DIAGNOSIS — G8929 Other chronic pain: Secondary | ICD-10-CM

## 2020-07-24 DIAGNOSIS — M25412 Effusion, left shoulder: Secondary | ICD-10-CM

## 2020-07-24 DIAGNOSIS — M25512 Pain in left shoulder: Secondary | ICD-10-CM

## 2020-07-24 DIAGNOSIS — M545 Low back pain, unspecified: Secondary | ICD-10-CM

## 2020-07-24 MED ORDER — HYDROCODONE-ACETAMINOPHEN 5-325 MG PO TABS: 1.0000 | ORAL_TABLET | Freq: Three times a day (TID) | ORAL | 0 refills | Status: DC | PRN

## 2020-08-03 ENCOUNTER — Encounter: Payer: Self-pay | Admitting: Family Medicine

## 2020-08-03 DIAGNOSIS — M25412 Effusion, left shoulder: Secondary | ICD-10-CM

## 2020-08-03 DIAGNOSIS — F430 Acute stress reaction: Secondary | ICD-10-CM

## 2020-08-03 DIAGNOSIS — G8929 Other chronic pain: Secondary | ICD-10-CM

## 2020-08-03 DIAGNOSIS — M25562 Pain in left knee: Secondary | ICD-10-CM

## 2020-08-04 ENCOUNTER — Encounter: Payer: Self-pay | Admitting: Family Medicine

## 2020-08-04 MED ORDER — TRAMADOL HCL 50 MG PO TABS: ORAL_TABLET | ORAL | 2 refills | Status: DC

## 2020-08-04 MED ORDER — HYDROCODONE-ACETAMINOPHEN 5-325 MG PO TABS: 1.0000 | ORAL_TABLET | Freq: Three times a day (TID) | ORAL | 0 refills | Status: DC | PRN

## 2020-08-04 MED ORDER — ALPRAZOLAM 0.25 MG PO TABS: 0.2500 mg | ORAL_TABLET | Freq: Every day | ORAL | 2 refills | Status: DC | PRN

## 2020-08-14 ENCOUNTER — Encounter: Payer: Self-pay | Admitting: Family Medicine

## 2020-08-14 DIAGNOSIS — M25562 Pain in left knee: Secondary | ICD-10-CM

## 2020-08-14 DIAGNOSIS — M25412 Effusion, left shoulder: Secondary | ICD-10-CM

## 2020-08-14 DIAGNOSIS — M545 Low back pain, unspecified: Secondary | ICD-10-CM

## 2020-08-15 MED ORDER — HYDROCODONE-ACETAMINOPHEN 5-325 MG PO TABS: 1.0000 | ORAL_TABLET | Freq: Two times a day (BID) | ORAL | 0 refills | Status: DC | PRN

## 2020-09-14 ENCOUNTER — Other Ambulatory Visit: Payer: Self-pay | Admitting: Family Medicine

## 2020-09-14 DIAGNOSIS — M25562 Pain in left knee: Secondary | ICD-10-CM

## 2020-09-14 DIAGNOSIS — M545 Low back pain, unspecified: Secondary | ICD-10-CM

## 2020-09-14 DIAGNOSIS — M25412 Effusion, left shoulder: Secondary | ICD-10-CM

## 2020-09-14 DIAGNOSIS — G8929 Other chronic pain: Secondary | ICD-10-CM

## 2020-09-14 DIAGNOSIS — M25512 Pain in left shoulder: Secondary | ICD-10-CM

## 2020-09-14 MED ORDER — HYDROCODONE-ACETAMINOPHEN 5-325 MG PO TABS: 1.0000 | ORAL_TABLET | Freq: Two times a day (BID) | ORAL | 0 refills | Status: DC | PRN

## 2020-10-13 ENCOUNTER — Other Ambulatory Visit: Payer: Self-pay | Admitting: Family Medicine

## 2020-10-13 DIAGNOSIS — M25562 Pain in left knee: Secondary | ICD-10-CM

## 2020-10-13 DIAGNOSIS — M545 Low back pain, unspecified: Secondary | ICD-10-CM

## 2020-10-13 DIAGNOSIS — G8929 Other chronic pain: Secondary | ICD-10-CM

## 2020-10-13 DIAGNOSIS — M25412 Effusion, left shoulder: Secondary | ICD-10-CM

## 2020-10-14 ENCOUNTER — Other Ambulatory Visit: Payer: Self-pay | Admitting: Family Medicine

## 2020-10-14 DIAGNOSIS — M25512 Pain in left shoulder: Secondary | ICD-10-CM

## 2020-10-14 DIAGNOSIS — M25412 Effusion, left shoulder: Secondary | ICD-10-CM

## 2020-10-14 DIAGNOSIS — G8929 Other chronic pain: Secondary | ICD-10-CM

## 2020-10-14 DIAGNOSIS — M545 Low back pain, unspecified: Secondary | ICD-10-CM

## 2020-10-14 DIAGNOSIS — M25562 Pain in left knee: Secondary | ICD-10-CM

## 2020-10-16 ENCOUNTER — Encounter: Payer: Self-pay | Admitting: Family Medicine

## 2020-10-16 MED ORDER — HYDROCODONE-ACETAMINOPHEN 5-325 MG PO TABS: 1.0000 | ORAL_TABLET | Freq: Two times a day (BID) | ORAL | 0 refills | Status: DC | PRN

## 2020-10-16 NOTE — Telephone Encounter (Signed)
Duplicate but this one has a comment  Patient comment: Thank you for calling my medicine in, I also need the pain medicine, I know I can't pick it up until tomorrow, but I was hoping that you would call it in so it would be ready for pick up tomorrow, thank you

## 2020-11-07 MED ORDER — TRAMADOL HCL 50 MG PO TABS: ORAL_TABLET | ORAL | 2 refills | Status: DC

## 2020-11-14 ENCOUNTER — Encounter: Payer: Self-pay | Admitting: Family Medicine

## 2020-11-14 DIAGNOSIS — F430 Acute stress reaction: Secondary | ICD-10-CM

## 2020-11-14 MED ORDER — ALPRAZOLAM 0.25 MG PO TABS: 0.2500 mg | ORAL_TABLET | Freq: Every day | ORAL | 2 refills | Status: DC | PRN

## 2020-11-16 ENCOUNTER — Other Ambulatory Visit: Payer: Self-pay | Admitting: Family Medicine

## 2020-11-16 DIAGNOSIS — G8929 Other chronic pain: Secondary | ICD-10-CM

## 2020-11-16 DIAGNOSIS — M25412 Effusion, left shoulder: Secondary | ICD-10-CM

## 2020-11-16 DIAGNOSIS — M25562 Pain in left knee: Secondary | ICD-10-CM

## 2020-11-16 MED ORDER — HYDROCODONE-ACETAMINOPHEN 5-325 MG PO TABS: 1.0000 | ORAL_TABLET | Freq: Two times a day (BID) | ORAL | 0 refills | Status: DC | PRN

## 2020-12-13 ENCOUNTER — Encounter: Payer: Self-pay | Admitting: Family Medicine

## 2020-12-13 ENCOUNTER — Other Ambulatory Visit: Payer: Self-pay | Admitting: Family Medicine

## 2020-12-13 DIAGNOSIS — G8929 Other chronic pain: Secondary | ICD-10-CM

## 2020-12-13 DIAGNOSIS — M25562 Pain in left knee: Secondary | ICD-10-CM

## 2020-12-13 DIAGNOSIS — M25412 Effusion, left shoulder: Secondary | ICD-10-CM

## 2020-12-13 MED ORDER — HYDROCODONE-ACETAMINOPHEN 5-325 MG PO TABS: 1.0000 | ORAL_TABLET | Freq: Two times a day (BID) | ORAL | 0 refills | Status: DC | PRN

## 2021-01-08 ENCOUNTER — Encounter: Payer: Self-pay | Admitting: Family Medicine

## 2021-01-08 DIAGNOSIS — M545 Low back pain, unspecified: Secondary | ICD-10-CM

## 2021-01-08 DIAGNOSIS — M25512 Pain in left shoulder: Secondary | ICD-10-CM

## 2021-01-08 DIAGNOSIS — M25562 Pain in left knee: Secondary | ICD-10-CM

## 2021-01-09 MED ORDER — HYDROCODONE-ACETAMINOPHEN 5-325 MG PO TABS: 1.0000 | ORAL_TABLET | Freq: Two times a day (BID) | ORAL | 0 refills | Status: DC | PRN

## 2021-01-18 ENCOUNTER — Encounter: Payer: Self-pay | Admitting: Family Medicine

## 2021-01-18 DIAGNOSIS — F430 Acute stress reaction: Secondary | ICD-10-CM

## 2021-01-18 MED ORDER — TRAMADOL HCL 50 MG PO TABS: ORAL_TABLET | ORAL | 2 refills | Status: DC

## 2021-01-18 MED ORDER — ALPRAZOLAM 0.25 MG PO TABS: 0.2500 mg | ORAL_TABLET | Freq: Every day | ORAL | 2 refills | Status: DC | PRN

## 2021-02-05 ENCOUNTER — Encounter: Payer: Self-pay | Admitting: Family Medicine

## 2021-02-05 DIAGNOSIS — M25512 Pain in left shoulder: Secondary | ICD-10-CM

## 2021-02-05 DIAGNOSIS — M545 Low back pain, unspecified: Secondary | ICD-10-CM

## 2021-02-05 DIAGNOSIS — M25562 Pain in left knee: Secondary | ICD-10-CM

## 2021-02-05 MED ORDER — HYDROCODONE-ACETAMINOPHEN 5-325 MG PO TABS: 1.0000 | ORAL_TABLET | Freq: Two times a day (BID) | ORAL | 0 refills | Status: DC | PRN

## 2021-03-01 ENCOUNTER — Other Ambulatory Visit: Payer: Self-pay | Admitting: Family Medicine

## 2021-03-01 DIAGNOSIS — G8929 Other chronic pain: Secondary | ICD-10-CM

## 2021-03-01 DIAGNOSIS — M25562 Pain in left knee: Secondary | ICD-10-CM

## 2021-03-02 NOTE — Progress Notes (Addendum)
[facility] [address], [place], [place] [address] [phone]  Date:  03/05/2021   Name:  Casey Huerta   DOB:  01-Jun-1955   MRN:  [id number]  PCP:  Darreld Mclean, MD    Chief Complaint: Follow-up (Concerns/ questions: /Flu shot today:/)   History of Present Illness:  Casey Huerta is a 66 y.o. very pleasant female patient who presents with the following:  Patient seen today for follow-up and medication check Most recent visit with myself was in May History of hyperlipidemia, hypertension, CAD status post CABG, chronic orthopedic pain-pain in her shoulder, neck, knee, lower back  I am currently treating her with hydrocodone number 30/month, Celebrex 100 twice daily, tramadol number 30/month She notes she is having some more trouble with her back recently- she is getting really stiff and having more pain.  She has known history of degenerative spine disease Films from May- IMPRESSION: Mild multilevel degenerative disc disease with moderate lower lumbar predominant facet arthritis and interspinous degenerative change, and degenerative grade 1 anterolisthesis at L4-L5.  We discussed some pain management strategies and options including chiropractic, massage, physical therapy, physiatry referral.  For the time being she declines these.  She does have a TENS unit she plans to try Otherwise can update labs today-UDS technically up-to-date but will be due in a few months.  Can update this today as well  Pneumococcal vaccine can be given-Prevnar 20 COVID booster- done  Time for Cologuard- order today  Flu vaccine- done  ?  Cardiology follow-up- pt states she continues to follow-up for her history of CAD status post CABG Needs mammogram order Past Medical History:  Diagnosis Date   Aortic insufficiency    a. Mild-mod AI by echo 2010.   Breast mass 05/17/2016   BIRADS 3: biopsy revealed benign fibroadenoma    COPD    CORONARY ARTERY DISEASE    a. CABG (LIMA-->LAD 2001). b. Myoview 2008 no ischemia, EF 63%.  c. cath 10/14/2013 mild 20% ost LAD, atretic LIMA, EF 55%, medical therapy   Hematemesis 09/2018   HYPERLIPIDEMIA    HYPERTENSION    MENORRHAGIA     Past Surgical History:  Procedure Laterality Date   arthroscopic knee surgery     BIOPSY  09/23/2018   Procedure: BIOPSY;  Surgeon: Juanita Craver, MD;  Location: [facility];  Service: Endoscopy;;   BREAST BIOPSY Right 05/28/2016   HYALINIZED FIBROADENOMA   CESAREAN SECTION     CORONARY ARTERY BYPASS GRAFT     ESOPHAGOGASTRODUODENOSCOPY (EGD) WITH PROPOFOL Left 09/23/2018   Procedure: ESOPHAGOGASTRODUODENOSCOPY (EGD) WITH PROPOFOL;  Surgeon: Juanita Craver, MD;  Location: [facility] ENDOSCOPY;  Service: Endoscopy;  Laterality: Left;   LEFT HEART CATH AND CORONARY ANGIOGRAPHY N/A 01/17/2017   Procedure: LEFT HEART CATH AND CORONARY ANGIOGRAPHY;  Surgeon: Belva Crome, MD;  Location: [facility] CV LAB;  Service: Cardiovascular;  Laterality: N/A;   LEFT HEART CATHETERIZATION WITH CORONARY/GRAFT ANGIOGRAM N/A 10/12/2013   Procedure: LEFT HEART CATHETERIZATION WITH Beatrix Fetters;  Surgeon: Troy Sine, MD;  Location: [facility] CATH LAB;  Service: Cardiovascular;  Laterality: N/A;    Social History   Tobacco Use   Smoking status: Former    Types: Cigarettes    Quit date: 1980    Years since quitting: 43.0   Smokeless tobacco: Never   Tobacco comments:    QUIT SMOKING IN 1999  Vaping Use   Vaping Use: Never used  Substance Use Topics   Alcohol use: Yes    Comment: friday nights - 2 glasses - rare   Drug use: No    Comment:  quit using in 1985    Family History  Problem Relation Age of Onset   Heart disease Mother    Heart disease Sister    Cancer Sister     Allergies  Allergen Reactions   Simvastatin Other (See Comments)    REACTION: constipation    Medication list has been reviewed and updated.  Current Outpatient  Medications on File Prior to Visit  Medication Sig Dispense Refill   ALPRAZolam (XANAX) 0.25 MG tablet Take 1 tablet (0.25 mg total) by mouth daily as needed for anxiety. 30 tablet 2   amLODipine (NORVASC) 5 MG tablet Take 1 tablet (5 mg total) by mouth daily. 90 tablet 3   aspirin 81 MG chewable tablet Chew 1 tablet (81 mg total) by mouth daily. 90 tablet 4   ATENOLOL PO Atenolol     celecoxib (CELEBREX) 100 MG capsule TAKE 1 CAPSULE BY MOUTH TWICE DAILY AS NEEDED FOR JOINT PAIN 60 capsule 0   HYDROcodone-acetaminophen (NORCO/VICODIN) 5-325 MG tablet Take 1 tablet by mouth every 12 (twelve) hours as needed for up to 30 doses for severe pain or moderate pain. 30 tablet 0   Multiple Vitamins-Minerals (MULTIVITAMIN WITH MINERALS) tablet Take 1 tablet by mouth daily.     nitroGLYCERIN (NITROSTAT) 0.4 MG SL tablet Place 1 tablet (0.4 mg total) under the tongue every 5 (five) minutes x 3 doses as needed for chest pain. 25 tablet 1   rosuvastatin (CRESTOR) 20 MG tablet Take 1 tablet (20 mg total) by mouth daily. 90 tablet 3   traMADol (ULTRAM) 50 MG tablet TAKE 1 TABLET BY MOUTH EVERY DAY AT BEDTIME DO  NOT  MIX  WITH  XANAX 30 tablet 2   No current facility-administered medications on file prior to visit.    Review of Systems:  As per HPI- otherwise negative.   Physical Examination: Vitals:   03/05/21 1345  BP: 130/72  Pulse: 67  Resp: 18  Temp: 98 F (36.7 C)  SpO2: 98%   Vitals:   03/05/21 1345  Weight: 131 lb (59.4 kg)  Height: 5\' 2"  (1.575 m)   Body mass index is 23.96 kg/m. Ideal Body Weight: Weight in (lb) to have BMI = 25: 136.4  GEN: no acute distress.  Normal weight, looks well HEENT: Atraumatic, Normocephalic.  Bilateral TM wnl, oropharynx normal.  PEERL,EOMI.   Ears and Nose: No external deformity. CV: RRR, No M/G/R. No JVD. No thrill. No extra heart sounds. PULM: CTA B, no wheezes, crackles, rhonchi. No retractions. No resp. distress. No accessory muscle use. ABD:  S, NT, ND, +BS. No rebound. No HSM. EXTR: No c/c/e PSYCH: Normally interactive. Conversant.  She is somewhat stiff and slow to get going after standing up from seated. There is muscle spasm in her bilateral lumbar paraspinous muscles  Assessment and Plan: Pure hypercholesterolemia - Plan: Lipid panel  Essential hypertension - Plan: CBC, Comprehensive metabolic panel  Pain in joint of left knee - Plan: DRUG MONITORING, PANEL 8 WITH CONFIRMATION, URINE, HYDROcodone-acetaminophen (NORCO/VICODIN) 5-325 MG tablet  Pain and swelling of left shoulder - Plan: DRUG MONITORING, PANEL 8 WITH CONFIRMATION, URINE, HYDROcodone-acetaminophen (NORCO/VICODIN) 5-325 MG tablet  Chronic pain of both knees - Plan: DRUG MONITORING, PANEL 8 WITH CONFIRMATION, URINE, celecoxib (CELEBREX) 100 MG capsule  Chronic pain of right thumb - Plan: DRUG MONITORING,  PANEL 8 WITH CONFIRMATION, URINE, celecoxib (CELEBREX) 100 MG capsule  Chronic bilateral low back pain without sciatica - Plan: DRUG MONITORING, PANEL 8 WITH CONFIRMATION, URINE, HYDROcodone-acetaminophen (NORCO/VICODIN) 5-325 MG tablet  Screening for thyroid disorder - Plan: TSH  Fatigue, unspecified type - Plan: TSH, VITAMIN D 25 Hydroxy (Vit-D Deficiency, Fractures)  Prediabetes - Plan: Comprehensive metabolic panel, Hemoglobin A1c  Screening for colon cancer - Plan: Cologuard  Encounter for screening mammogram for malignant neoplasm of breast - Plan: MM 3D SCREEN BREAST BILATERAL  Patient seen today for follow-up.  Labs are pending as above UDS today Ordered Cologuard and mammogram Asked her to follow-up in 6 months assuming all is well. Will plan further follow- up pending labs.    Signed Lamar Blinks, MD  Received labs 1/17, message to patient  Results for orders placed or performed in visit on 03/05/21  CBC  Result Value Ref Range   WBC 5.6 4.0 - 10.5 K/uL   RBC 4.56 3.87 - 5.11 Mil/uL   Platelets 385.0 150.0 - 400.0 K/uL    Hemoglobin 13.3 12.0 - 15.0 g/dL   HCT 41.3 36.0 - 46.0 %   MCV 90.7 78.0 - 100.0 fl   MCHC 32.2 30.0 - 36.0 g/dL   RDW 13.0 11.5 - 15.5 %  Comprehensive metabolic panel  Result Value Ref Range   Sodium 142 135 - 145 mEq/L   Potassium 4.8 3.5 - 5.1 mEq/L   Chloride 105 96 - 112 mEq/L   CO2 30 19 - 32 mEq/L   Glucose, Bld 87 70 - 99 mg/dL   BUN 8 6 - 23 mg/dL   Creatinine, Ser 0.84 0.40 - 1.20 mg/dL   Total Bilirubin 0.3 0.2 - 1.2 mg/dL   Alkaline Phosphatase 93 39 - 117 U/L   AST 18 0 - 37 U/L   ALT 15 0 - 35 U/L   Total Protein 7.8 6.0 - 8.3 g/dL   Albumin 4.3 3.5 - 5.2 g/dL   GFR 73.44 >60.00 mL/min   Calcium 10.2 8.4 - 10.5 mg/dL  Hemoglobin A1c  Result Value Ref Range   Hgb A1c MFr Bld 6.1 4.6 - 6.5 %  Lipid panel  Result Value Ref Range   Cholesterol 136 0 - 200 mg/dL   Triglycerides 86.0 0.0 - 149.0 mg/dL   HDL 58.20 >39.00 mg/dL   VLDL 17.2 0.0 - 40.0 mg/dL   LDL Cholesterol 61 0 - 99 mg/dL   Total CHOL/HDL Ratio 2    NonHDL 77.91   TSH  Result Value Ref Range   TSH 1.39 0.35 - 5.50 uIU/mL  VITAMIN D 25 Hydroxy (Vit-D Deficiency, Fractures)  Result Value Ref Range   VITD 49.65 30.00 - 100.00 ng/mL

## 2021-03-05 ENCOUNTER — Ambulatory Visit (INDEPENDENT_AMBULATORY_CARE_PROVIDER_SITE_OTHER): Payer: Managed Care, Other (non HMO) | Admitting: Family Medicine

## 2021-03-05 ENCOUNTER — Telehealth: Payer: Self-pay

## 2021-03-05 VITALS — BP 130/72 | HR 67 | Temp 98.0°F | Resp 18 | Ht 62.0 in | Wt 131.0 lb

## 2021-03-05 DIAGNOSIS — M25512 Pain in left shoulder: Secondary | ICD-10-CM

## 2021-03-05 DIAGNOSIS — M79644 Pain in right finger(s): Secondary | ICD-10-CM

## 2021-03-05 DIAGNOSIS — R5383 Other fatigue: Secondary | ICD-10-CM | POA: Diagnosis not present

## 2021-03-05 DIAGNOSIS — M25562 Pain in left knee: Secondary | ICD-10-CM

## 2021-03-05 DIAGNOSIS — M545 Low back pain, unspecified: Secondary | ICD-10-CM

## 2021-03-05 DIAGNOSIS — R7303 Prediabetes: Secondary | ICD-10-CM

## 2021-03-05 DIAGNOSIS — G8929 Other chronic pain: Secondary | ICD-10-CM

## 2021-03-05 DIAGNOSIS — Z1231 Encounter for screening mammogram for malignant neoplasm of breast: Secondary | ICD-10-CM

## 2021-03-05 DIAGNOSIS — I10 Essential (primary) hypertension: Secondary | ICD-10-CM

## 2021-03-05 DIAGNOSIS — Z1329 Encounter for screening for other suspected endocrine disorder: Secondary | ICD-10-CM

## 2021-03-05 DIAGNOSIS — Z1211 Encounter for screening for malignant neoplasm of colon: Secondary | ICD-10-CM

## 2021-03-05 DIAGNOSIS — M25412 Effusion, left shoulder: Secondary | ICD-10-CM

## 2021-03-05 DIAGNOSIS — M25561 Pain in right knee: Secondary | ICD-10-CM

## 2021-03-05 DIAGNOSIS — E7800 Pure hypercholesterolemia, unspecified: Secondary | ICD-10-CM | POA: Diagnosis not present

## 2021-03-05 MED ORDER — CELECOXIB 100 MG PO CAPS: ORAL_CAPSULE | ORAL | 3 refills | Status: DC

## 2021-03-05 MED ORDER — HYDROCODONE-ACETAMINOPHEN 5-325 MG PO TABS: 1.0000 | ORAL_TABLET | Freq: Two times a day (BID) | ORAL | 0 refills | Status: DC | PRN

## 2021-03-05 NOTE — Telephone Encounter (Signed)
PA approved.   [id number];JHHIDU:[id number];Review Type:Prior Auth;Coverage Start Date:03/05/2021;Coverage End Date:03/05/2022

## 2021-03-05 NOTE — Telephone Encounter (Signed)
PA initiated via Covermymeds; KEY: [id number]. Awaiting determination.

## 2021-03-05 NOTE — Patient Instructions (Signed)
Good to see you today!  Please see me in about 6 months I ordered your mammogram, and you should get a cologuard kit at home soon to screen for colon cancer  For your back- consider getting a massage, seeing chiropractic.  A Tens unit may also be helpful

## 2021-03-06 ENCOUNTER — Encounter: Payer: Self-pay | Admitting: Family Medicine

## 2021-03-06 LAB — VITAMIN D 25 HYDROXY (VIT D DEFICIENCY, FRACTURES): VITD: 49.65 ng/mL (ref 30.00–100.00)

## 2021-03-06 LAB — COMPREHENSIVE METABOLIC PANEL
ALT: 15 U/L (ref 0–35)
AST: 18 U/L (ref 0–37)
Albumin: 4.3 g/dL (ref 3.5–5.2)
Alkaline Phosphatase: 93 U/L (ref 39–117)
BUN: 8 mg/dL (ref 6–23)
CO2: 30 mEq/L (ref 19–32)
Calcium: 10.2 mg/dL (ref 8.4–10.5)
Chloride: 105 mEq/L (ref 96–112)
Creatinine, Ser: 0.84 mg/dL (ref 0.40–1.20)
GFR: 73.44 mL/min (ref >60.00)
Glucose, Bld: 87 mg/dL (ref 70–99)
Potassium: 4.8 mEq/L (ref 3.5–5.1)
Sodium: 142 mEq/L (ref 135–145)
Total Bilirubin: 0.3 mg/dL (ref 0.2–1.2)
Total Protein: 7.8 g/dL (ref 6.0–8.3)

## 2021-03-06 LAB — LIPID PANEL
Cholesterol: 136 mg/dL (ref 0–200)
HDL: 58.2 mg/dL (ref >39.00)
LDL Cholesterol: 61 mg/dL (ref 0–99)
NonHDL: 77.91
Total CHOL/HDL Ratio: 2
Triglycerides: 86 mg/dL (ref 0.0–149.0)
VLDL: 17.2 mg/dL (ref 0.0–40.0)

## 2021-03-06 LAB — CBC
HCT: 41.3 % (ref 36.0–46.0)
Hemoglobin: 13.3 g/dL (ref 12.0–15.0)
MCHC: 32.2 g/dL (ref 30.0–36.0)
MCV: 90.7 fl (ref 78.0–100.0)
Platelets: 385 10*3/uL (ref 150.0–400.0)
RBC: 4.56 Mil/uL (ref 3.87–5.11)
RDW: 13 % (ref 11.5–15.5)
WBC: 5.6 10*3/uL (ref 4.0–10.5)

## 2021-03-06 LAB — TSH: TSH: 1.39 u[IU]/mL (ref 0.35–5.50)

## 2021-03-06 LAB — HEMOGLOBIN A1C: Hgb A1c MFr Bld: 6.1 % (ref 4.6–6.5)

## 2021-03-08 LAB — DRUG MONITORING, PANEL 8 WITH CONFIRMATION, URINE
6 Acetylmorphine: NEGATIVE ng/mL (ref <10)
Alcohol Metabolites: NEGATIVE ng/mL (ref <500)
Alphahydroxyalprazolam: 33 ng/mL — ABNORMAL HIGH (ref <25)
Alphahydroxymidazolam: NEGATIVE ng/mL (ref <50)
Alphahydroxytriazolam: NEGATIVE ng/mL (ref <50)
Aminoclonazepam: NEGATIVE ng/mL (ref <25)
Amphetamines: NEGATIVE ng/mL (ref <500)
Benzodiazepines: POSITIVE ng/mL — AB (ref <100)
Buprenorphine, Urine: NEGATIVE ng/mL (ref <5)
Cocaine Metabolite: NEGATIVE ng/mL (ref <150)
Codeine: NEGATIVE ng/mL (ref <50)
Creatinine: 45.5 mg/dL (ref >=20.0)
Hydrocodone: NEGATIVE ng/mL (ref <50)
Hydromorphone: 63 ng/mL — ABNORMAL HIGH (ref <50)
Hydroxyethylflurazepam: NEGATIVE ng/mL (ref <50)
Lorazepam: NEGATIVE ng/mL (ref <50)
MDMA: NEGATIVE ng/mL (ref <500)
Marijuana Metabolite: NEGATIVE ng/mL (ref <20)
Morphine: NEGATIVE ng/mL (ref <50)
Nordiazepam: NEGATIVE ng/mL (ref <50)
Norhydrocodone: 68 ng/mL — ABNORMAL HIGH (ref <50)
Opiates: POSITIVE ng/mL — AB (ref <100)
Oxazepam: NEGATIVE ng/mL (ref <50)
Oxidant: NEGATIVE ug/mL (ref <200)
Oxycodone: NEGATIVE ng/mL (ref <100)
Temazepam: NEGATIVE ng/mL (ref <50)
pH: 7.4 (ref 4.5–9.0)

## 2021-03-08 LAB — DM TEMPLATE

## 2021-03-17 ENCOUNTER — Encounter: Payer: Self-pay | Admitting: Family Medicine

## 2021-03-17 LAB — COLOGUARD: COLOGUARD: NEGATIVE

## 2021-03-19 ENCOUNTER — Encounter (HOSPITAL_BASED_OUTPATIENT_CLINIC_OR_DEPARTMENT_OTHER): Payer: Self-pay

## 2021-03-19 ENCOUNTER — Ambulatory Visit (HOSPITAL_BASED_OUTPATIENT_CLINIC_OR_DEPARTMENT_OTHER)
Admission: RE | Admit: 2021-03-19 | Discharge: 2021-03-19 | Disposition: A | Discharge status: Home / Self Care | Payer: Commercial Managed Care - HMO | Source: Ambulatory Visit | Attending: Family Medicine | Admitting: Family Medicine

## 2021-03-19 ENCOUNTER — Other Ambulatory Visit: Payer: Self-pay

## 2021-03-19 DIAGNOSIS — Z1231 Encounter for screening mammogram for malignant neoplasm of breast: Secondary | ICD-10-CM | POA: Insufficient documentation

## 2021-03-20 ENCOUNTER — Other Ambulatory Visit: Payer: Self-pay | Admitting: Family Medicine

## 2021-03-20 DIAGNOSIS — R928 Other abnormal and inconclusive findings on diagnostic imaging of breast: Secondary | ICD-10-CM

## 2021-04-05 ENCOUNTER — Encounter: Payer: Self-pay | Admitting: Family Medicine

## 2021-04-05 DIAGNOSIS — M545 Low back pain, unspecified: Secondary | ICD-10-CM

## 2021-04-05 DIAGNOSIS — M25562 Pain in left knee: Secondary | ICD-10-CM

## 2021-04-05 DIAGNOSIS — M25412 Effusion, left shoulder: Secondary | ICD-10-CM

## 2021-04-05 MED ORDER — HYDROCODONE-ACETAMINOPHEN 5-325 MG PO TABS: 1.0000 | ORAL_TABLET | Freq: Two times a day (BID) | ORAL | 0 refills | Status: DC | PRN

## 2021-04-13 ENCOUNTER — Other Ambulatory Visit: Payer: Self-pay

## 2021-04-13 ENCOUNTER — Ambulatory Visit
Admission: RE | Admit: 2021-04-13 | Discharge: 2021-04-13 | Disposition: A | Discharge status: Home / Self Care | Payer: Managed Care, Other (non HMO) | Source: Ambulatory Visit | Attending: Family Medicine | Admitting: Family Medicine

## 2021-04-13 ENCOUNTER — Other Ambulatory Visit: Payer: Self-pay | Admitting: Family Medicine

## 2021-04-13 DIAGNOSIS — R928 Other abnormal and inconclusive findings on diagnostic imaging of breast: Secondary | ICD-10-CM

## 2021-04-17 ENCOUNTER — Other Ambulatory Visit: Payer: Managed Care, Other (non HMO)

## 2021-04-23 ENCOUNTER — Other Ambulatory Visit: Payer: Self-pay | Admitting: Family Medicine

## 2021-04-23 DIAGNOSIS — F430 Acute stress reaction: Secondary | ICD-10-CM

## 2021-04-23 NOTE — Telephone Encounter (Signed)
[facility] is needing dx code for tramadol please.  ?

## 2021-04-25 ENCOUNTER — Ambulatory Visit
Admission: RE | Admit: 2021-04-25 | Discharge: 2021-04-25 | Disposition: A | Discharge status: Home / Self Care | Payer: Managed Care, Other (non HMO) | Source: Ambulatory Visit | Attending: Family Medicine | Admitting: Family Medicine

## 2021-04-25 ENCOUNTER — Encounter: Payer: Self-pay | Admitting: Family Medicine

## 2021-04-25 DIAGNOSIS — M25562 Pain in left knee: Secondary | ICD-10-CM

## 2021-04-25 DIAGNOSIS — G8929 Other chronic pain: Secondary | ICD-10-CM

## 2021-04-25 DIAGNOSIS — C50919 Malignant neoplasm of unspecified site of unspecified female breast: Secondary | ICD-10-CM

## 2021-04-25 DIAGNOSIS — R928 Other abnormal and inconclusive findings on diagnostic imaging of breast: Secondary | ICD-10-CM

## 2021-04-25 DIAGNOSIS — M25412 Effusion, left shoulder: Secondary | ICD-10-CM

## 2021-04-25 HISTORY — DX: Malignant neoplasm of unspecified site of unspecified female breast: C50.919

## 2021-04-26 MED ORDER — TRAMADOL HCL 50 MG PO TABS: ORAL_TABLET | ORAL | 3 refills | Status: DC

## 2021-05-01 ENCOUNTER — Ambulatory Visit: Payer: Self-pay | Admitting: General Surgery

## 2021-05-01 DIAGNOSIS — C50411 Malignant neoplasm of upper-outer quadrant of right female breast: Secondary | ICD-10-CM

## 2021-05-02 ENCOUNTER — Encounter: Payer: Self-pay | Admitting: Family Medicine

## 2021-05-02 DIAGNOSIS — M25562 Pain in left knee: Secondary | ICD-10-CM

## 2021-05-02 DIAGNOSIS — M545 Low back pain, unspecified: Secondary | ICD-10-CM

## 2021-05-02 DIAGNOSIS — M25412 Effusion, left shoulder: Secondary | ICD-10-CM

## 2021-05-02 MED ORDER — HYDROCODONE-ACETAMINOPHEN 5-325 MG PO TABS: 1.0000 | ORAL_TABLET | Freq: Two times a day (BID) | ORAL | 0 refills | Status: DC | PRN

## 2021-05-03 ENCOUNTER — Telehealth: Payer: Self-pay | Admitting: Hematology and Oncology

## 2021-05-03 ENCOUNTER — Encounter: Payer: Self-pay | Admitting: *Deleted

## 2021-05-03 ENCOUNTER — Telehealth: Payer: Self-pay | Admitting: Genetic Counselor

## 2021-05-03 ENCOUNTER — Other Ambulatory Visit: Payer: Self-pay | Admitting: General Surgery

## 2021-05-03 NOTE — Telephone Encounter (Signed)
Scheduled appt per 3/16 referral. Pt is aware of appt date and time. Pt is aware to arrive 15 mins prior to appt time and to bring and updated insurance card. Pt is aware of appt location.   ?

## 2021-05-07 NOTE — Progress Notes (Signed)
New Breast Cancer Diagnosis: Right Breast UOQ ? ?Did patient present with symptoms (if so, please note symptoms) or screening mammography?:Screening Mass   ? ?Location and Extent of disease :right breast. Located at 10 o'clock position, measured 0.7 cm in greatest dimension. Adenopathy no. ? 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 ?Receptor Status: ER(positive), PR (positive), Her2-neu (negative), Ki-(10%) ? ?Surgeon and surgical plan, if any:  ?Dr. Marlou Starks ?-Right Breast Lumpectomy with radioactive seed and SLN biopsy 06/06/2021 ? ? ?Medical oncologist, treatment if any:   ?Dr. Lindi Adie 05/17/2021 ? ? ? ?Family History of Breast/Ovarian/Prostate Cancer: Sister had Ovarian, Brother had prostate, ? ?Lymphedema issues, if any: No    ? ?Pain issues, if any: No    ? ?SAFETY ISSUES: ?Prior radiation? No ?Pacemaker/ICD? No ?Possible current pregnancy? Postmenopausal ?Is the patient on methotrexate? No ? ?Current Complaints / other details:   ? ?

## 2021-05-07 NOTE — Progress Notes (Signed)
?Radiation Oncology         [phone] ?________________________________ ? ?Name: Casey Huerta        MRN: [id number]  ?Date of Service: 05/08/2021 DOB: Jun 17, 1955 ? ?[id number], Gay Filler, MD  Casey Kussmaul, MD    ? ?REFERRING PHYSICIAN: Jovita Kussmaul, MD ? ? ?DIAGNOSIS: The encounter diagnosis was Malignant neoplasm of upper-outer quadrant of right breast in female, estrogen receptor positive (Casey Huerta). ? ? ?HISTORY OF PRESENT ILLNESS: Casey Huerta is a 66 y.o. female seen for a new diagnosis of right breast cancer.  The patient was found to have a mass in the right breast on screening mammogram.  In the superior breast in the 10 o'clock position by diagnostic ultrasound this measured 9 mm, and her right axilla was negative for adenopathy.   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  She is seen to discuss treatment of her breast cancer.  She is scheduled to undergo right lumpectomy with sentinel lymph node biopsy with Dr. Marlou Starks on 06/06/2021. ? ?PREVIOUS RADIATION THERAPY: No ? ? ?PAST MEDICAL HISTORY:  ?Past Medical History:  ?Diagnosis Date  ? Aortic insufficiency   ? a. Mild-mod AI by echo 2010.  ? Breast mass 05/17/2016  ? BIRADS 3: biopsy revealed benign fibroadenoma  ? COPD   ? CORONARY ARTERY DISEASE   ? a. CABG (LIMA-->LAD 2001). b. Myoview 2008 no ischemia, EF 63%.  c. cath 10/14/2013 mild 20% ost LAD, atretic LIMA, EF 55%, medical therapy  ? Hematemesis 09/2018  ? HYPERLIPIDEMIA   ? HYPERTENSION   ? MENORRHAGIA   ?   ? ? ?PAST SURGICAL HISTORY: ?Past Surgical History:  ?Procedure Laterality Date  ? arthroscopic knee surgery    ? BIOPSY  09/23/2018  ? Procedure: BIOPSY;  Surgeon: Casey Craver, MD;  Location: [facility] ENDOSCOPY;  Service: Endoscopy;;  ? BREAST BIOPSY Right 05/28/2016  ? HYALINIZED FIBROADENOMA  ? CESAREAN SECTION    ? CORONARY ARTERY BYPASS GRAFT     ? ESOPHAGOGASTRODUODENOSCOPY (EGD) WITH PROPOFOL Left 09/23/2018  ? Procedure: ESOPHAGOGASTRODUODENOSCOPY (EGD) WITH PROPOFOL;  Surgeon: Casey Craver, MD;  Location: [facility] ENDOSCOPY;  Service: Endoscopy;  Laterality: Left;  ? LEFT HEART CATH AND CORONARY ANGIOGRAPHY N/A 01/17/2017  ? Procedure: LEFT HEART CATH AND CORONARY ANGIOGRAPHY;  Surgeon: Casey Crome, MD;  Location: [place] CV LAB;  Service: Cardiovascular;  Laterality: N/A;  ? LEFT HEART CATHETERIZATION WITH CORONARY/GRAFT ANGIOGRAM N/A 10/12/2013  ? Procedure: LEFT HEART CATHETERIZATION WITH Casey Huerta;  Surgeon: Troy Sine, MD;  Location: [facility] CATH LAB;  Service: Cardiovascular;  Laterality: N/A;  ? ? ? ?FAMILY HISTORY:  ?Family History  ?Problem Relation Age of Onset  ? Heart disease Mother   ? Heart disease Sister   ? Cancer Sister   ?     ovarian  ? Cancer - Prostate Brother   ? ? ? ?SOCIAL HISTORY:  reports that she quit smoking about 43 years ago. She has never used smokeless tobacco. She reports current alcohol use. She reports that she does not use drugs.  The patient is married and lives in [place].  She is retired from [employer]. ? ? ?ALLERGIES: Simvastatin ? ? ?MEDICATIONS:  ?Current Outpatient Medications  ?Medication Sig Dispense Refill  ? ALPRAZolam (XANAX) 0.25 MG tablet TAKE 1 TABLET BY MOUTH ONCE DAILY AS NEEDED FOR ANXIETY  30 tablet 3  ? amLODipine (NORVASC) 5 MG tablet Take 1 tablet (5 mg total) by mouth daily. 90 tablet 3  ? aspirin 81 MG chewable tablet Chew 1 tablet (81 mg total) by mouth daily. 90 tablet 4  ? ATENOLOL PO Atenolol    ? celecoxib (CELEBREX) 100 MG capsule TAKE 1 CAPSULE BY MOUTH TWICE DAILY AS NEEDED FOR JOINT PAIN 180 capsule 3  ? HYDROcodone-acetaminophen (NORCO/VICODIN) 5-325 MG tablet Take 1 tablet by mouth every 12 (twelve) hours as needed for up to 30 doses for severe pain or moderate pain. 30 tablet 0  ? Multiple Vitamins-Minerals (MULTIVITAMIN WITH MINERALS) tablet Take 1 tablet by  mouth daily.    ? nitroGLYCERIN (NITROSTAT) 0.4 MG SL tablet Place 1 tablet (0.4 mg total) under the tongue every 5 (five) minutes x 3 doses as needed for chest pain. 25 tablet 1  ? rosuvastatin (CRESTOR) 20 MG tablet Take 1 tablet (20 mg total) by mouth daily. 90 tablet 3  ? traMADol (ULTRAM) 50 MG tablet TAKE 1 TABLET BY MOUTH EVERY DAY AT BEDTIME DO  NOT  MIX  WITH  XANAX 30 tablet 3  ? ?No current facility-administered medications for this visit.  ? ? ? ?REVIEW OF SYSTEMS: On review of systems, the patient reports that she is doing well overall. She is anxious to have surgery but otherwise is not having any breast specific complaints.  ? ?  ? ?PHYSICAL EXAM:  ?Wt Readings from Last 3 Encounters:  ?03/05/21 131 lb (59.4 kg)  ?07/05/20 129 lb (58.5 kg)  ?02/24/20 133 lb (60.3 kg)  ? ?Temp Readings from Last 3 Encounters:  ?03/05/21 98 ?F (36.7 ?C) (Oral)  ?07/05/20 98.4 ?F (36.9 ?C)  ?09/24/18 98.8 ?F (37.1 ?C) (Oral)  ? ?BP Readings from Last 3 Encounters:  ?03/05/21 130/72  ?07/05/20 126/74  ?02/24/20 132/82  ? ?Pulse Readings from Last 3 Encounters:  ?03/05/21 67  ?07/05/20 (!) 55  ?02/24/20 82  ? ? ?In general this is a well appearing African-American female in no acute distress. She's alert and oriented x4 and appropriate throughout the examination. Cardiopulmonary assessment is negative for acute distress and she exhibits normal effort. Bilateral breast exam is deferred. ? ? ? ?ECOG = 0 ? ?0 - Asymptomatic (Fully active, able to carry on all predisease activities without restriction) ? ?1 - Symptomatic but completely ambulatory (Restricted in physically strenuous activity but ambulatory and able to carry out work of a light or sedentary nature. For example, light housework, office work) ? ?2 - Symptomatic, <50% in bed during the day (Ambulatory and capable of all self care but unable to carry out any work activities. Up and about more than 50% of waking hours) ? ?3 - Symptomatic, >50% in bed, but not  bedbound (Capable of only limited self-care, confined to bed or chair 50% or more of waking hours) ? ?4 - Bedbound (Completely disabled. Cannot carry on any self-care. Totally confined to bed or chair) ? ?5 - Death ? ? Oken MM, Creech RH, Tormey DC, et al. [phone]). "Toxicity and response criteria of the [facility] Group". [place] Oncol. 5 (6): 649-55 ? ? ? ?LABORATORY DATA:  ?Lab Results  ?Component Value Date  ? WBC 5.6 03/05/2021  ? HGB 13.3 03/05/2021  ? HCT 41.3 03/05/2021  ? MCV 90.7 03/05/2021  ? PLT 385.0 03/05/2021  ? ?Lab Results  ?Component Value Date  ? NA 142 03/05/2021  ? K 4.8 03/05/2021  ?  CL 105 03/05/2021  ? CO2 30 03/05/2021  ? ?Lab Results  ?Component Value Date  ? ALT 15 03/05/2021  ? AST 18 03/05/2021  ? ALKPHOS 93 03/05/2021  ? BILITOT 0.3 03/05/2021  ? ?  ? ?RADIOGRAPHY: US BREAST LTD UNI RIGHT INC AXILLA ? ?Result Date: 04/13/2021 ?CLINICAL DATA:  The patient was called back for a mass in the right breast at 10 o'clock. EXAM: DIGITAL DIAGNOSTIC UNILATERAL RIGHT MAMMOGRAM WITH TOMOSYNTHESIS AND CAD; ULTRASOUND RIGHT BREAST LIMITED TECHNIQUE: Right digital diagnostic mammography and breast tomosynthesis was performed. The images were evaluated with computer-aided detection.; Targeted ultrasound examination of the right breast was performed COMPARISON:  Previous exam(s). ACR Breast Density Category c: The breast tissue is heterogeneously dense, which may obscure small masses. FINDINGS: The 10 o'clock right breast mass persists. Targeted ultrasound is performed, showing irregular mass in the right breast at 10 o'clock, 9 cm from the nipple correlating with the mammographically identified mass measuring 9 x 8 x 9 mm. No axillary adenopathy. IMPRESSION: Highly suspicious right breast mass at 10 o'clock. No axillary adenopathy. RECOMMENDATION: Recommend ultrasound-guided biopsy of the right breast mass. I have discussed the findings and recommendations with the patient. If  applicable, a reminder letter will be sent to the patient regarding the next appointment. BI-RADS CATEGORY  4: Suspicious. Electronically Signed   By: Dorise Bullion III M.D.   On: 04/13/2021 14:56 ? ?MM DIAG BREA

## 2021-05-08 ENCOUNTER — Ambulatory Visit
Admission: RE | Admit: 2021-05-08 | Discharge: 2021-05-08 | Disposition: A | Discharge status: Home / Self Care | Payer: Managed Care, Other (non HMO) | Source: Ambulatory Visit | Attending: Radiation Oncology | Admitting: Radiation Oncology

## 2021-05-08 ENCOUNTER — Other Ambulatory Visit: Payer: Self-pay

## 2021-05-08 ENCOUNTER — Encounter: Payer: Self-pay | Admitting: Radiation Oncology

## 2021-05-08 ENCOUNTER — Encounter: Payer: Managed Care, Other (non HMO) | Admitting: Physical Therapy

## 2021-05-08 VITALS — BP 125/60 | HR 72 | Temp 97.6°F | Resp 18 | Ht 62.0 in | Wt 133.0 lb

## 2021-05-08 DIAGNOSIS — E785 Hyperlipidemia, unspecified: Secondary | ICD-10-CM | POA: Diagnosis not present

## 2021-05-08 DIAGNOSIS — Z7982 Long term (current) use of aspirin: Secondary | ICD-10-CM | POA: Insufficient documentation

## 2021-05-08 DIAGNOSIS — C50411 Malignant neoplasm of upper-outer quadrant of right female breast: Secondary | ICD-10-CM | POA: Diagnosis present

## 2021-05-08 DIAGNOSIS — I2510 Atherosclerotic heart disease of native coronary artery without angina pectoris: Secondary | ICD-10-CM | POA: Diagnosis not present

## 2021-05-08 DIAGNOSIS — I10 Essential (primary) hypertension: Secondary | ICD-10-CM | POA: Insufficient documentation

## 2021-05-08 DIAGNOSIS — J449 Chronic obstructive pulmonary disease, unspecified: Secondary | ICD-10-CM | POA: Insufficient documentation

## 2021-05-08 DIAGNOSIS — Z170 Estrogen receptor positive status [ER+]: Secondary | ICD-10-CM | POA: Diagnosis not present

## 2021-05-08 DIAGNOSIS — Z79899 Other long term (current) drug therapy: Secondary | ICD-10-CM | POA: Diagnosis not present

## 2021-05-08 DIAGNOSIS — Z8041 Family history of malignant neoplasm of ovary: Secondary | ICD-10-CM | POA: Diagnosis not present

## 2021-05-10 ENCOUNTER — Encounter: Payer: Self-pay | Admitting: Family Medicine

## 2021-05-11 ENCOUNTER — Other Ambulatory Visit: Payer: Self-pay | Admitting: Family Medicine

## 2021-05-11 MED ORDER — ASPIRIN 81 MG PO CHEW: 81.0000 mg | CHEWABLE_TABLET | Freq: Every day | ORAL | 4 refills | Status: DC

## 2021-05-15 ENCOUNTER — Encounter: Payer: Self-pay | Admitting: *Deleted

## 2021-05-16 NOTE — Progress Notes (Signed)
[facility] ?CONSULT NOTE ? ?Patient Care Team: ?Copland, Gay Filler, MD as PCP - General (Family Medicine) ?Lelon Perla, MD as Consulting Physician (Cardiology) ?Mauro Kaufmann, RN as Oncology Nurse Navigator ?Rockwell Germany, RN as Oncology Nurse Navigator ? ?CHIEF COMPLAINTS/PURPOSE OF CONSULTATION:  ?Newly diagnosed breast cancer malignant breast cancer ? ?HISTORY OF PRESENTING ILLNESS:  ?Casey Huerta 66 y.o. female is here because of recent diagnosis of right malignant breast cancer. She presents to the clinic today for a consult. ? ?I reviewed her records extensively and collaborated the history with the patient. ? ?SUMMARY OF ONCOLOGIC HISTORY: ?Oncology History  ?Malignant neoplasm of upper-outer quadrant of right breast in female, estrogen receptor positive ([place])  ?04/25/2021 Initial Diagnosis  ? Screening mammogram detected right breast mass, by ultrasound measured 9 mm, biopsy revealed grade 1 IDC with low-grade DCIS, ER 95%, PR 90%, Ki-67 10%, HER2 2+ by IHC, FISH negative, ratio 1.17, copy #1.7 ?  ?05/07/2021 Cancer Staging  ? Staging form: Breast, AJCC 8th Edition ?- Clinical stage from 05/07/2021: Stage IA (cT1b, cN0, cM0, G1, ER+, PR+, HER2-) - Signed by Nicholas Lose, MD on 05/17/2021 ?Stage prefix: Initial diagnosis ?Method of lymph node assessment: Clinical ?Histologic grading system: 3 grade system ? ?  ? ? ? ?MEDICAL HISTORY:  ?Past Medical History:  ?Diagnosis Date  ? Aortic insufficiency   ? a. Mild-mod AI by echo 2010.  ? Breast cancer ([place]) 04/25/2021  ? Breast mass 05/17/2016  ? BIRADS 3: biopsy revealed benign fibroadenoma  ? COPD   ? CORONARY ARTERY DISEASE   ? a. CABG (LIMA-->LAD 2001). b. Myoview 2008 no ischemia, EF 63%.  c. cath 10/14/2013 mild 20% ost LAD, atretic LIMA, EF 55%, medical therapy  ? Hematemesis 09/2018  ? HYPERLIPIDEMIA   ? HYPERTENSION   ? MENORRHAGIA   ? ? ?SURGICAL HISTORY: ?Past Surgical History:  ?Procedure Laterality Date  ? arthroscopic knee  surgery    ? BIOPSY  09/23/2018  ? Procedure: BIOPSY;  Surgeon: Juanita Craver, MD;  Location: [facility] ENDOSCOPY;  Service: Endoscopy;;  ? BREAST BIOPSY Right 05/28/2016  ? HYALINIZED FIBROADENOMA  ? CESAREAN SECTION    ? CORONARY ARTERY BYPASS GRAFT    ? ESOPHAGOGASTRODUODENOSCOPY (EGD) WITH PROPOFOL Left 09/23/2018  ? Procedure: ESOPHAGOGASTRODUODENOSCOPY (EGD) WITH PROPOFOL;  Surgeon: Juanita Craver, MD;  Location: [facility] ENDOSCOPY;  Service: Endoscopy;  Laterality: Left;  ? LEFT HEART CATH AND CORONARY ANGIOGRAPHY N/A 01/17/2017  ? Procedure: LEFT HEART CATH AND CORONARY ANGIOGRAPHY;  Surgeon: Belva Crome, MD;  Location: [place] CV LAB;  Service: Cardiovascular;  Laterality: N/A;  ? LEFT HEART CATHETERIZATION WITH CORONARY/GRAFT ANGIOGRAM N/A 10/12/2013  ? Procedure: LEFT HEART CATHETERIZATION WITH Beatrix Fetters;  Surgeon: Troy Sine, MD;  Location: [facility] CATH LAB;  Service: Cardiovascular;  Laterality: N/A;  ? ? ?SOCIAL HISTORY: ?Social History  ? ?Socioeconomic History  ? Marital status: Married  ?  Spouse name: Not on file  ? Number of children: Not on file  ? Years of education: Not on file  ? Highest education level: Not on file  ?Occupational History  ? Not on file  ?Tobacco Use  ? Smoking status: Former  ?  Types: Cigarettes  ?  Quit date: 38  ?  Years since quitting: 43.2  ? Smokeless tobacco: Never  ? Tobacco comments:  ?  [place]  ?Vaping Use  ? Vaping Use: Never used  ?Substance and Sexual Activity  ? Alcohol use:  Yes  ?  Comment: friday nights - 2 glasses - rare  ? Drug use: No  ?  Comment:  quit using in 1985  ? Sexual activity: Yes  ?  Partners: Male  ?Other Topics Concern  ? Not on file  ?Social History Narrative  ? Marital status: married  ? Children: 3 chidlren and 16 grandchildren and 1 great grandchild  ? Lives with: husband  ? Employment: customer service [name]  ? Tobacco:  quit  ? Alcohol:  Friday nights  ? Drugs:  none  ? Exercise:  no  ? Seatbelt: 100%  ? Guns in  home: rifle  ?     Secured: yes  ? ?Social Determinants of Health  ? ?Financial Resource Strain: Not on file  ?Food Insecurity: Not on file  ?Transportation Needs: Not on file  ?Physical Activity: Not on file  ?Stress: Not on file  ?Social Connections: Not on file  ?Intimate Partner Violence: Not At Risk  ? Fear of Current or Ex-Partner: No  ? Emotionally Abused: No  ? Physically Abused: No  ? Sexually Abused: No  ? ? ?FAMILY HISTORY: ?Family History  ?Problem Relation Age of Onset  ? Heart disease Mother   ? Cancer Mother   ? Cancer Father   ? Heart disease Sister   ? Cancer Sister   ?     ovarian  ? Cancer - Prostate Brother   ? ? ?ALLERGIES:  is allergic to simvastatin. ? ?MEDICATIONS:  ?Current Outpatient Medications  ?Medication Sig Dispense Refill  ? ALPRAZolam (XANAX) 0.25 MG tablet TAKE 1 TABLET BY MOUTH ONCE DAILY AS NEEDED FOR ANXIETY 30 tablet 3  ? amLODipine (NORVASC) 5 MG tablet Take 1 tablet (5 mg total) by mouth daily. 90 tablet 3  ? aspirin 81 MG chewable tablet Chew 1 tablet (81 mg total) by mouth daily. 90 tablet 4  ? atenolol (TENORMIN) 12.5 mg TABS tablet Atenolol    ? celecoxib (CELEBREX) 100 MG capsule TAKE 1 CAPSULE BY MOUTH TWICE DAILY AS NEEDED FOR JOINT PAIN 180 capsule 3  ? HYDROcodone-acetaminophen (NORCO/VICODIN) 5-325 MG tablet Take 1 tablet by mouth every 12 (twelve) hours as needed for up to 30 doses for severe pain or moderate pain. 30 tablet 0  ? Multiple Vitamins-Minerals (MULTIVITAMIN WITH MINERALS) tablet Take 1 tablet by mouth daily.    ? nitroGLYCERIN (NITROSTAT) 0.4 MG SL tablet Place 1 tablet (0.4 mg total) under the tongue every 5 (five) minutes x 3 doses as needed for chest pain. 25 tablet 1  ? rosuvastatin (CRESTOR) 20 MG tablet Take 1 tablet (20 mg total) by mouth daily. 90 tablet 3  ? traMADol (ULTRAM) 50 MG tablet TAKE 1 TABLET BY MOUTH EVERY DAY AT BEDTIME DO  NOT  MIX  WITH  XANAX 30 tablet 3  ? ?No current facility-administered medications for this visit.   ? ? ?REVIEW OF SYSTEMS:   ?Constitutional: Denies fevers, chills or abnormal night sweats ?All other systems were reviewed with the patient and are negative. ? ?PHYSICAL EXAMINATION: ?ECOG PERFORMANCE STATUS: 1 - Symptomatic but completely ambulatory ? ?Vitals:  ? 05/17/21 1305  ?BP: 133/82  ?Pulse: 79  ?Resp: 18  ?Temp: (!) 97.5 ?F (36.4 ?C)  ?SpO2: 99%  ? ?Filed Weights  ? 05/17/21 1305  ?Weight: 132 lb 1.6 oz (59.9 kg)  ? ?  ? ?LABORATORY DATA:  ?I have reviewed the data as listed ?Lab Results  ?Component Value Date  ? WBC 5.6 03/05/2021  ?  HGB 13.3 03/05/2021  ? HCT 41.3 03/05/2021  ? MCV 90.7 03/05/2021  ? PLT 385.0 03/05/2021  ? ?Lab Results  ?Component Value Date  ? NA 142 03/05/2021  ? K 4.8 03/05/2021  ? CL 105 03/05/2021  ? CO2 30 03/05/2021  ? ? ?RADIOGRAPHIC STUDIES: ?I have personally reviewed the radiological reports and agreed with the findings in the report. ? ?ASSESSMENT AND PLAN:  ?Malignant neoplasm of upper-outer quadrant of right breast in female, estrogen receptor positive ([place]) ?04/25/2021:Screening mammogram detected right breast mass, by ultrasound measured 9 mm, biopsy revealed grade 1 IDC with low-grade DCIS, ER 95%, PR 90%, Ki-67 10%, HER2 2+ by IHC, FISH negative, ratio 1.17, copy #1.7 ? ?Pathology and radiology counseling:Discussed with the patient, the details of pathology including the type of breast cancer,the clinical staging, the significance of ER, PR and HER-2/neu receptors and the implications for treatment. After reviewing the pathology in detail, we proceeded to discuss the different treatment options between surgery, radiation, chemotherapy, antiestrogen therapies. ? ?Recommendations: ?1. Breast conserving surgery followed by ?2. Oncotype DX testing to determine if chemotherapy (if the final tumor is greater than 1 cm) ?3. Adjuvant radiation therapy followed by ?4. Adjuvant antiestrogen therapy ? ?Oncotype counseling: I discussed Oncotype DX test. I explained to the patient  that this is a 21 gene panel to evaluate patient tumors DNA to calculate recurrence score. This would help determine whether patient has high risk or low risk breast cancer. She understands that if her tumor was foun

## 2021-05-17 ENCOUNTER — Other Ambulatory Visit: Payer: Self-pay

## 2021-05-17 ENCOUNTER — Inpatient Hospital Stay: Payer: Managed Care, Other (non HMO) | Attending: Hematology and Oncology | Admitting: Hematology and Oncology

## 2021-05-17 DIAGNOSIS — Z170 Estrogen receptor positive status [ER+]: Secondary | ICD-10-CM

## 2021-05-17 DIAGNOSIS — Z87891 Personal history of nicotine dependence: Secondary | ICD-10-CM

## 2021-05-17 DIAGNOSIS — C50411 Malignant neoplasm of upper-outer quadrant of right female breast: Secondary | ICD-10-CM | POA: Diagnosis present

## 2021-05-17 DIAGNOSIS — Z7982 Long term (current) use of aspirin: Secondary | ICD-10-CM

## 2021-05-17 DIAGNOSIS — Z79899 Other long term (current) drug therapy: Secondary | ICD-10-CM | POA: Diagnosis not present

## 2021-05-17 NOTE — Therapy (Signed)
?OUTPATIENT PHYSICAL THERAPY BREAST CANCER BASELINE EVALUATION ? ? ?Patient Name: Casey Huerta ?MRN: [id number] ?DOB:September 18, 1955, 66 y.o., female ?Today's Date: 05/18/2021 ? ? PT End of Session - 05/18/21 1209   ? ? Visit Number 1   ? Number of Visits 2   ? Date for PT Re-Evaluation 07/13/21   ? PT Start Time 1105   ? PT Stop Time 1200   ? PT Time Calculation (min) 55 min   ? Activity Tolerance Patient tolerated treatment well   ? Behavior During Therapy [facility] for tasks assessed/performed   ? ?  ?  ? ?  ? ? ?Past Medical History:  ?Diagnosis Date  ? Aortic insufficiency   ? a. Mild-mod AI by echo 2010.  ? Breast cancer ([place]) 04/25/2021  ? Breast mass 05/17/2016  ? BIRADS 3: biopsy revealed benign fibroadenoma  ? COPD   ? CORONARY ARTERY DISEASE   ? a. CABG (LIMA-->LAD 2001). b. Myoview 2008 no ischemia, EF 63%.  c. cath 10/14/2013 mild 20% ost LAD, atretic LIMA, EF 55%, medical therapy  ? Hematemesis 09/2018  ? HYPERLIPIDEMIA   ? HYPERTENSION   ? MENORRHAGIA   ? ?Past Surgical History:  ?Procedure Laterality Date  ? arthroscopic knee surgery    ? BIOPSY  09/23/2018  ? Procedure: BIOPSY;  Surgeon: Juanita Craver, MD;  Location: [facility] ENDOSCOPY;  Service: Endoscopy;;  ? BREAST BIOPSY Right 05/28/2016  ? HYALINIZED FIBROADENOMA  ? CESAREAN SECTION    ? CORONARY ARTERY BYPASS GRAFT    ? ESOPHAGOGASTRODUODENOSCOPY (EGD) WITH PROPOFOL Left 09/23/2018  ? Procedure: ESOPHAGOGASTRODUODENOSCOPY (EGD) WITH PROPOFOL;  Surgeon: Juanita Craver, MD;  Location: [facility] ENDOSCOPY;  Service: Endoscopy;  Laterality: Left;  ? LEFT HEART CATH AND CORONARY ANGIOGRAPHY N/A 01/17/2017  ? Procedure: LEFT HEART CATH AND CORONARY ANGIOGRAPHY;  Surgeon: Belva Crome, MD;  Location: [place] CV LAB;  Service: Cardiovascular;  Laterality: N/A;  ? LEFT HEART CATHETERIZATION WITH CORONARY/GRAFT ANGIOGRAM N/A 10/12/2013  ? Procedure: LEFT HEART CATHETERIZATION WITH Beatrix Fetters;  Surgeon: Troy Sine, MD;  Location: [facility] CATH LAB;  Service:  Cardiovascular;  Laterality: N/A;  ? ?Patient Active Problem List  ? Diagnosis Date Noted  ? Malignant neoplasm of upper-outer quadrant of right breast in female, estrogen receptor positive ([place]) 05/03/2021  ? Prediabetes 02/28/2020  ? UGIB (upper gastrointestinal bleed) 09/22/2018  ? Chest pain with moderate risk of acute coronary syndrome 01/16/2017  ? Osteoarthritis of both knees 06/14/2016  ? Breast mass 05/17/2016  ? Aortic insufficiency 03/06/2012  ? Dyslipidemia 06/19/2007  ? Essential hypertension 06/19/2007  ? Hx of CABG 06/19/2007  ? ? ?PCP: Copland, Gay Filler, MD ? ?REFERRING PROVIDER: Autumn Messing, MD ?REFERRING DIAG: Right Breast Cancer ? ?THERAPY DIAG:  ?Malignant neoplasm of upper-outer quadrant of right female breast, unspecified estrogen receptor status ([place]) ? ?Abnormal posture ? ?ONSET DATE: 04/25/2021 ? ?SUBJECTIVE                                                                                                                                                                                          ? ?  SUBJECTIVE STATEMENT: ?Patient reports she is here today to be seen by her medical team for her newly diagnosed right breast cancer.  ? ?PERTINENT HISTORY:  ? Screening mammogram detected right breast mass, by ultrasound measured 9 mm, biopsy revealed grade 1 IDC with low-grade DCIS, ER 95%, PR 90%, Ki-67 10%, HER2 2+ by IHC, FISH negative  ? ? ? ?PATIENT GOALS   reduce lymphedema risk and learn post op HEP.  ? ?PAIN:  ? ?Are you having pain? No ? ? ?PRECAUTIONS: Active CA , OA ? ?HAND DOMINANCE: right ? ?WEIGHT BEARING RESTRICTIONS No ? ?FALLS:  ?Has patient fallen in last 6 months? No ? ?LIVING ENVIRONMENT: ?Patient lives with: husband ?Lives in: House/apartment ?Has following equipment at home: None ? ?OCCUPATION: retired Astronomer ? ?LEISURE: computer games, ? ?PRIOR LEVEL OF FUNCTION: Independent ? ? ?OBJECTIVE ? ?COGNITION: ? Overall cognitive status: Within functional limits for  tasks assessed   ? ?POSTURE:  ?Forward head and rounded shoulders posture ? ?UPPER EXTREMITY AROM/PROM: ? ?A/PROM RIGHT  05/18/2021 ?  ?Shoulder extension 69  ?Shoulder flexion 142  ?Shoulder abduction 145  ?Shoulder internal rotation 55  ?Shoulder external rotation 108  ?  (Blank rows = not tested) ? ?A/PROM LEFT  05/18/2021  ?Shoulder extension 64  ?Shoulder flexion 147  ?Shoulder abduction 143  ?Shoulder internal rotation 68  ?Shoulder external rotation 110  ?  (Blank rows = not tested) ? ? ?CERVICAL AROM: ?All within normal limits:  ? ? ?UPPER EXTREMITY STRENGTH: WNL ? ? ?LYMPHEDEMA ASSESSMENTS:  ? ?[place] RIGHT  05/18/2021  ?10 cm proximal to olecranon process 26.8  ?Olecranon process 23.5  ?10 cm proximal to ulnar styloid process 20.6  ?Just proximal to ulnar styloid process 15.6  ?Across hand at thumb web space 19.5  ?At base of 2nd digit 6.65  ?(Blank rows = not tested) ? ?[place] LEFT  05/18/2021  ?10 cm proximal to olecranon process 27.5  ?Olecranon process 23.6  ?10 cm proximal to ulnar styloid process 20.1  ?Just proximal to ulnar styloid process 15.2  ?Across hand at thumb web space 19.0  ?At base of 2nd digit 6.2  ?(Blank rows = not tested) ? ? ?L-DEX LYMPHEDEMA SCREENING: ? ?The patient was assessed using the L-Dex machine today to produce a lymphedema index baseline score. The patient will be reassessed on a regular basis (typically every 3 months) to obtain new L-Dex scores. If the score is > 6.5 points away from his/her baseline score indicating onset of subclinical lymphedema, it will be recommended to wear a compression garment for 4 weeks, 12 hours per day and then be reassessed. If the score continues to be > 6.5 points from baseline at reassessment, we will initiate lymphedema treatment. Assessing in this manner has a 95% rate of preventing clinically significant lymphedema. ? ? L-DEX FLOWSHEETS - 05/18/21 1100   ? ?  ? L-DEX LYMPHEDEMA SCREENING  ? Measurement Type Unilateral   ? L-DEX  MEASUREMENT EXTREMITY Upper Extremity   ? POSITION  Standing   ? DOMINANT SIDE Right   ? At Risk Side Right   ? BASELINE SCORE (UNILATERAL) -0.4   ? ?  ?  ? ?  ? ? ? ?QUICK DASH SURVEY:16% due to hand arthritis ? ? ?PATIENT EDUCATION:  ?Education details: Lymphedema risk reduction and post op shoulder/posture HEP, ABC class, SOZO screens ?Person educated: Patient ?Education method: Explanation, Demonstration, Handout ?Education comprehension: Patient verbalized understanding and returned demonstration ? ? ?HOME EXERCISE  PROGRAM: ?Patient was instructed today in a home exercise program today for post op shoulder range of motion. These included active assist shoulder flexion in sitting/supine, scapular retraction, wall walking with shoulder abduction, and hands behind head external rotation in supine.  She was encouraged to do these twice a day, holding 3 seconds and repeating 5 times when permitted by her physician. ? ? ?ASSESSMENT: ? ?CLINICAL IMPRESSION: ?Her multidisciplinary medical team met prior to her assessments to determine a recommended treatment plan. She is planning to have Right breast lumpectomy with SLNB on 06/06/2021. She will benefit from a post op PT reassessment to determine needs and from L-Dex screens every 3 months for 2 years to detect subclinical lymphedema. ? ?Pt will benefit from skilled therapeutic intervention to improve on the following deficits: Decreased knowledge of precautions, impaired UE functional use, pain, decreased ROM, postural dysfunction.  ? ?PT treatment/interventions: ADL/self-care home management, pt/family education, therapeutic exercise ? ?REHAB POTENTIAL: Excellent ? ?CLINICAL DECISION MAKING: Stable/uncomplicated ? ?EVALUATION COMPLEXITY: Low ? ? ?GOALS: ?Goals reviewed with patient? YES ? ?LONG TERM GOALS: (STG=LTG) ? ? Name Target Date Goal status  ?1 Pt will be able to verbalize understanding of pertinent lymphedema risk reduction practices relevant to her dx  specifically related to skin care.  ?Baseline:  No knowledge 05/18/2021 Achieved at eval  ?2 Pt will be able to return demo and/or verbalize understanding of the post op HEP related to regaining shoulder ROM. ?Baseline:

## 2021-05-17 NOTE — Assessment & Plan Note (Signed)
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 ?Pathology and radiology counseling:Discussed with the patient, the details of pathology including the type of breast cancer,the clinical staging, the significance of ER, PR and HER-2/neu receptors and the implications for treatment. After reviewing the pathology in detail, we proceeded to discuss the different treatment options between surgery, radiation, chemotherapy, antiestrogen therapies. ? ?Recommendations: ?1. Breast conserving surgery followed by ?2. Oncotype DX testing to determine if chemotherapy would be of any benefit followed by ?3. Adjuvant radiation therapy followed by ?4. Adjuvant antiestrogen therapy ? ?Oncotype counseling: I discussed Oncotype DX test. I explained to the patient that this is a 21 gene panel to evaluate patient tumors DNA to calculate recurrence score. This would help determine whether patient has high risk or low risk breast cancer. She understands that if her tumor was found to be high risk, she would benefit from systemic chemotherapy. If low risk, no need of chemotherapy. ? ?Return to clinic after surgery to discuss final pathology report and then determine if Oncotype DX testing will need to be sent. ? ? ?

## 2021-05-18 ENCOUNTER — Telehealth: Payer: Self-pay | Admitting: *Deleted

## 2021-05-18 ENCOUNTER — Ambulatory Visit: Payer: Managed Care, Other (non HMO) | Attending: General Surgery

## 2021-05-18 ENCOUNTER — Encounter: Payer: Self-pay | Admitting: *Deleted

## 2021-05-18 DIAGNOSIS — C50411 Malignant neoplasm of upper-outer quadrant of right female breast: Secondary | ICD-10-CM | POA: Insufficient documentation

## 2021-05-18 DIAGNOSIS — Z170 Estrogen receptor positive status [ER+]: Secondary | ICD-10-CM | POA: Insufficient documentation

## 2021-05-18 DIAGNOSIS — R293 Abnormal posture: Secondary | ICD-10-CM | POA: Diagnosis present

## 2021-05-18 NOTE — Telephone Encounter (Signed)
Spoke with patient to follow up from new patient appt and assess navigation needs.  Patient denies any questions or concerns at this time. Encouraged her to call should anything arise.  ?

## 2021-05-23 ENCOUNTER — Inpatient Hospital Stay: Payer: Commercial Managed Care - HMO | Attending: Hematology and Oncology | Admitting: Genetic Counselor

## 2021-05-23 ENCOUNTER — Inpatient Hospital Stay: Payer: Commercial Managed Care - HMO

## 2021-05-23 ENCOUNTER — Encounter: Payer: Self-pay | Admitting: Genetic Counselor

## 2021-05-23 ENCOUNTER — Other Ambulatory Visit: Payer: Self-pay

## 2021-05-23 DIAGNOSIS — Z7982 Long term (current) use of aspirin: Secondary | ICD-10-CM | POA: Insufficient documentation

## 2021-05-23 DIAGNOSIS — Z8042 Family history of malignant neoplasm of prostate: Secondary | ICD-10-CM | POA: Diagnosis not present

## 2021-05-23 DIAGNOSIS — C50411 Malignant neoplasm of upper-outer quadrant of right female breast: Secondary | ICD-10-CM | POA: Insufficient documentation

## 2021-05-23 DIAGNOSIS — Z170 Estrogen receptor positive status [ER+]: Secondary | ICD-10-CM | POA: Insufficient documentation

## 2021-05-23 DIAGNOSIS — Z806 Family history of leukemia: Secondary | ICD-10-CM

## 2021-05-23 DIAGNOSIS — Z8041 Family history of malignant neoplasm of ovary: Secondary | ICD-10-CM | POA: Diagnosis not present

## 2021-05-23 DIAGNOSIS — Z79899 Other long term (current) drug therapy: Secondary | ICD-10-CM | POA: Insufficient documentation

## 2021-05-23 NOTE — Progress Notes (Signed)
REFERRING PROVIDER: ?Nicholas Lose, MD ?[address] ?[place],  [place] [address] ? ?PRIMARY PROVIDER:  ?Copland, Gay Filler, MD ? ?PRIMARY REASON FOR VISIT:  ?1. Family history of prostate cancer   ?2. Family history of ovarian cancer   ?3. Family history of leukemia   ?4. Malignant neoplasm of upper-outer quadrant of right breast in female, estrogen receptor positive ([place])   ? ? ? ?HISTORY OF PRESENT ILLNESS:   ?Casey Huerta, a 66 y.o. female, was seen for a [place] cancer genetics consultation at the request of Dr. Lindi Adie due to a personal and family history of cancer.  Casey Huerta presents to clinic today to discuss the possibility of a hereditary predisposition to cancer, genetic testing, and to further clarify her future cancer risks, as well as potential cancer risks for family members.  ? ?In March 2023, at the age of 18, Casey Huerta was diagnosed with invasive ductal carcinoma of the right breast. The treatment plan lumpectomy, possible chemotherapy and radiation.  ?  ? ?CANCER HISTORY:  ?Oncology History  ?Malignant neoplasm of upper-outer quadrant of right breast in female, estrogen receptor positive ([place])  ?04/25/2021 Initial Diagnosis  ? Screening mammogram detected right breast mass, by ultrasound measured 9 mm, biopsy revealed grade 1 IDC with low-grade DCIS, ER 95%, PR 90%, Ki-67 10%, HER2 2+ by IHC, FISH negative, ratio 1.17, copy #1.7 ?  ?05/07/2021 Cancer Staging  ? Staging form: Breast, AJCC 8th Edition ?- Clinical stage from 05/07/2021: Stage IA (cT1b, cN0, cM0, G1, ER+, PR+, HER2-) - Signed by Nicholas Lose, MD on 05/17/2021 ?Stage prefix: Initial diagnosis ?Method of lymph node assessment: Clinical ?Histologic grading system: 3 grade system ? ?  ? ? ? ?RISK FACTORS:  ?Menarche was at age 62.  ?First live birth at age 71.  ?OCP use for approximately 1 years.  ?Ovaries intact: yes.  ?Hysterectomy: no.  ?Menopausal status: postmenopausal.  ?HRT use: 0 years. ?Colonoscopy: yes;  normal. ?Mammogram within the last year: yes. ?Number of breast biopsies: 1. ?Up to date with pelvic exams: yes. ?Any excessive radiation exposure in the past: no ? ?Past Medical History:  ?Diagnosis Date  ? Aortic insufficiency   ? a. Mild-mod AI by echo 2010.  ? Breast cancer ([place]) 04/25/2021  ? Breast mass 05/17/2016  ? BIRADS 3: biopsy revealed benign fibroadenoma  ? COPD   ? CORONARY ARTERY DISEASE   ? a. CABG (LIMA-->LAD 2001). b. Myoview 2008 no ischemia, EF 63%.  c. cath 10/14/2013 mild 20% ost LAD, atretic LIMA, EF 55%, medical therapy  ? Family history of leukemia   ? Family history of ovarian cancer   ? Family history of prostate cancer   ? Hematemesis 09/2018  ? HYPERLIPIDEMIA   ? HYPERTENSION   ? MENORRHAGIA   ? ? ?Past Surgical History:  ?Procedure Laterality Date  ? arthroscopic knee surgery    ? BIOPSY  09/23/2018  ? Procedure: BIOPSY;  Surgeon: Juanita Craver, MD;  Location: [facility] ENDOSCOPY;  Service: Endoscopy;;  ? BREAST BIOPSY Right 05/28/2016  ? HYALINIZED FIBROADENOMA  ? CESAREAN SECTION    ? CORONARY ARTERY BYPASS GRAFT    ? ESOPHAGOGASTRODUODENOSCOPY (EGD) WITH PROPOFOL Left 09/23/2018  ? Procedure: ESOPHAGOGASTRODUODENOSCOPY (EGD) WITH PROPOFOL;  Surgeon: Juanita Craver, MD;  Location: [facility] ENDOSCOPY;  Service: Endoscopy;  Laterality: Left;  ? LEFT HEART CATH AND CORONARY ANGIOGRAPHY N/A 01/17/2017  ? Procedure: LEFT HEART CATH AND CORONARY ANGIOGRAPHY;  Surgeon: Belva Crome, MD;  Location: [place] CV LAB;  Service: Cardiovascular;  Laterality: N/A;  ? LEFT HEART CATHETERIZATION WITH CORONARY/GRAFT ANGIOGRAM N/A 10/12/2013  ? Procedure: LEFT HEART CATHETERIZATION WITH Beatrix Fetters;  Surgeon: Troy Sine, MD;  Location[facility] CATH LAB;  Service: Cardiovascular;  Laterality: N/A;  ? ? ?Social History  ? ?Socioeconomic History  ? Marital status: Married  ?  Spouse name: Not on file  ? Number of children: Not on file  ? Years of education: Not on file  ? Highest education level: Not on file   ?Occupational History  ? Not on file  ?Tobacco Use  ? Smoking status: Former  ?  Types: Cigarettes  ?  Quit date: 51  ?  Years since quitting: 43.2  ? Smokeless tobacco: Never  ? Tobacco comments:  ?  [place]  ?Vaping Use  ? Vaping Use: Never used  ?Substance and Sexual Activity  ? Alcohol use: Yes  ?  Comment: friday nights - 2 glasses - rare  ? Drug use: No  ?  Comment:  quit using in 1985  ? Sexual activity: Yes  ?  Partners: Male  ?Other Topics Concern  ? Not on file  ?Social History Narrative  ? Marital status: married  ? Children: 3 chidlren and 16 grandchildren and 1 great grandchild  ? Lives with: husband  ? Employment: customer service [name]  ? Tobacco:  quit  ? Alcohol:  Friday nights  ? Drugs:  none  ? Exercise:  no  ? Seatbelt: 100%  ? Guns in home: rifle  ?     Secured: yes  ? ?Social Determinants of Health  ? ?Financial Resource Strain: Not on file  ?Food Insecurity: Not on file  ?Transportation Needs: Not on file  ?Physical Activity: Not on file  ?Stress: Not on file  ?Social Connections: Not on file  ?  ? ?FAMILY HISTORY:  ?We obtained a detailed, 4-generation family history.  Significant diagnoses are listed below: ?Family History  ?Problem Relation Age of Onset  ? Heart disease Mother   ? Cancer Mother   ?     possibly ovarian cancer  ? Lung cancer Father   ? Heart disease Sister   ? Ovarian cancer Sister   ? Cancer - Prostate Brother 62  ? Leukemia Niece   ?     great niece died of leukemia at 55  ? ? ?The patient has a son and two daughters who are cancer free.  She has 12 siblings.  One sister had ovarian cancer, one sister's granddaughter had leukemia and died at 50, and one brother had prostate cancer at 62.  The parents are deceased. ? ?The patient's mother possibly had ovarian cancer and died at 88.  She was an only child.  Her parents died of old age. ? ?The patient's father died of lung cancer at 66.  He had 9 siblings and no information is known.  There is no  information about the patient's paternal grandparents. ? ?Ms. Plude is unaware of previous family history of genetic testing for hereditary cancer risks. Patient's maternal ancestors are of African American descent, and paternal ancestors are of [place] American and Caucasian descent. There is no reported Ashkenazi Jewish ancestry. There is no known consanguinity. ? ?GENETIC COUNSELING ASSESSMENT: Ms. Guidotti is a 66 y.o. female with a personal and family history of cancer which is somewhat suggestive of a hereditary cancer syndrome and predisposition to cancer given the combination of cancer. We, therefore, discussed and recommended the  following at today's visit.  ? ?DISCUSSION: We discussed that, in general, most cancer is not inherited in families, but instead is sporadic or familial. Sporadic cancers occur by chance and typically happen at older ages (>50 years) as this type of cancer is caused by genetic changes acquired during an individual?s lifetime. Some families have more cancers than would be expected by chance; however, the ages or types of cancer are not consistent with a known genetic mutation or known genetic mutations have been ruled out. This type of familial cancer is thought to be due to a combination of multiple genetic, environmental, hormonal, and lifestyle factors. While this combination of factors likely increases the risk of cancer, the exact source of this risk is not currently identifiable or testable. ? ?We discussed that 5 - 10% of breast cancer is hereditary, with most cases associated with BRCA mutations.  There are other genes that can be associated with hereditary breast cancer syndromes.  These include ATM, CHEK2 and PALB2.  We discussed that testing is beneficial for several reasons including knowing how to follow individuals after completing their treatment, identifying whether potential treatment options such as PARP inhibitors would be beneficial, and understand if other  family members could be at risk for cancer and allow them to undergo genetic testing.  ? ?We reviewed the characteristics, features and inheritance patterns of hereditary cancer syndromes. We also discussed genetic

## 2021-05-24 ENCOUNTER — Encounter (HOSPITAL_BASED_OUTPATIENT_CLINIC_OR_DEPARTMENT_OTHER): Payer: Self-pay | Admitting: General Surgery

## 2021-05-24 NOTE — Progress Notes (Signed)
Spoke to [place] at [facility] about needing a cardiac clearance prior to surgery ?

## 2021-05-25 ENCOUNTER — Telehealth (HOSPITAL_BASED_OUTPATIENT_CLINIC_OR_DEPARTMENT_OTHER): Payer: Self-pay

## 2021-05-25 NOTE — Telephone Encounter (Signed)
? ?  Name: Casey Huerta  ?DOB: 1956/02/13  ?MRN: [id number] ? ?Primary Cardiologist: Kirk Ruths, MD ? ?Chart reviewed as part of pre-operative protocol coverage. Because of Casey Huerta's past medical history and time since last visit, she will require a follow-up visit in order to better assess preoperative cardiovascular risk. ? ?Pre-op covering staff: ?- Please schedule appointment and call patient to inform them. If patient already had an upcoming appointment within acceptable timeframe, please add "pre-op clearance" to the appointment notes so provider is aware. ?- Please contact requesting surgeon's office via preferred method (i.e, phone, fax) to inform them of need for appointment prior to surgery. ? ?If applicable, this message will also be routed to pharmacy pool and/or primary cardiologist for input on holding anticoagulant/antiplatelet agent as requested below so that this information is available to the clearing provider at time of patient's appointment.  ? ?Lenna Sciara, NP  ?05/25/2021, 4:25 PM  ? ?

## 2021-05-25 NOTE — Telephone Encounter (Signed)
Pt has appt 05/30/21 with Sande Rives, PAC. Will send notes to [facility] for upcoming appt. Will send FYI to requesting office the pt has appt 05/30/21 ?

## 2021-05-25 NOTE — Telephone Encounter (Signed)
? ?  Pre-operative Risk Assessment  ?  ?Patient Name: Casey Huerta  ?DOB: Apr 06, 1955 ?MRN: [id number]  ? ?  ? ?Request for Surgical Clearance   ? ?Procedure:   Right Breast Lumpectomy ? ?Date of Surgery:  Clearance 06/06/21                              ?   ?Surgeon:   ?Surgeon's Group or Practice Name:  [facility]  ?Phone number:  [phone] ?Fax number:  [phone] ?  ?Type of Clearance Requested:   ?- Medical  ?- Pharmacy:  Hold Aspirin 5 days prior to procedure  ?  ?Type of Anesthesia:  General  ?  ?Additional requests/questions:   ? ?Signed, ?Jacqulynn Cadet   ?05/25/2021, 12:01 PM  ? ?

## 2021-05-26 NOTE — Progress Notes (Signed)
?Cardiology Office Note:   ? ?Date:  05/30/2021  ? ?ID:  Casey Huerta, DOB [birth year], MRN [id number] ? ?PCP:  Casey Mclean, MD  ?Cardiologist:  Kirk Ruths, MD  ?Electrophysiologist:  None  ? ?Referring MD: Casey Mclean, MD  ? ?Chief Complaint: pre-op evaluation ? ?History of Present Illness:   ? ?Casey Huerta is a 66 y.o. female with a history of CAD s/p single-vessel CABG with LIMA to LAD in 2001, aortic insufficiency, hypertension, hyperlipidemia, and breast cancer who is followed by Dr. Stanford Breed and presents today for pre-op evaluation for upcoming breast lumpectomy. ? ?Patient has a long history of CAD s/p remote single-vessel CABG with LIMA to LAD in 2001.  Myoview in 2008 showed no evidence of ischemia.  Repeat cath in 09/2013 for recurrent chest pain showed atretic LIMA graft arising from subclavian with predominant retrograde filling of the graft with LAD ejection and 20% smooth ostial LAD narrowing.  Medical therapy was recommended.  Last cardiac catheterization and 12/2016 again for further evaluation of chest pain showed stable anatomy with only 20 to 25% stenosis of ostial to proximal LAD with no other disease.  She had known occluded proximally mid LAD identified by retrograde flow of LIMA from LAD to proximal segment or total occlusion exist. Chest pain was felt to be non-cardiac in nature.  ? ?Patient was last seen by Dr. Stanford Breed in 10/2018 for a virtual visit at which time she reported some weakness and fatigue as well as chronic dyspnea on exertion which has not changed.  She also reported occasional sharp chest pain that only last for 1 to 2 seconds at a time but denied any exertional pain.  Repeat echo was ordered for routine monitoring of aortic insufficiency.  Echo was completed in 03/2019 and showed LVEF of 60-65% with normal wall motion and grade 1 diastolic dysfunction as well as moderate AI.  Repeat echo in 1 year was recommended. ? ?Patient presents today for pre-op  evaluation for upcoming right breast lumpectomy. Here alone. Patient is doing very well from a cardiac standpoint. She denies any chest pain, shortness of breath, orthopnea, PND, lower extremity edema, palpitations, lightheadedness, dizziness, or syncope. She stays active taking care of her multiple grandchildren and is easily able to complete >4.0 METs. ? ?Past Medical History:  ?Diagnosis Date  ? Aortic insufficiency   ? a. Mild-mod AI by echo 2010.  ? Arthritis   ? Breast cancer ([place]) 04/25/2021  ? Breast mass 05/17/2016  ? BIRADS 3: biopsy revealed benign fibroadenoma  ? COPD   ? CORONARY ARTERY DISEASE   ? a. CABG (LIMA-->LAD 2001). b. Myoview 2008 no ischemia, EF 63%.  c. cath 10/14/2013 mild 20% ost LAD, atretic LIMA, EF 55%, medical therapy  ? Family history of leukemia   ? Family history of ovarian cancer   ? Family history of prostate cancer   ? Hematemesis 09/2018  ? Hyperlipidemia   ? MENORRHAGIA   ? Unspecified essential hypertension   ? ? ?Past Surgical History:  ?Procedure Laterality Date  ? arthroscopic knee surgery    ? BIOPSY  09/23/2018  ? Procedure: BIOPSY;  Surgeon: Juanita Craver, MD;  Location: [facility] ENDOSCOPY;  Service: Endoscopy;;  ? BREAST BIOPSY Right 05/28/2016  ? HYALINIZED FIBROADENOMA  ? CESAREAN SECTION    ? CORONARY ARTERY BYPASS GRAFT  2001  ? ESOPHAGOGASTRODUODENOSCOPY (EGD) WITH PROPOFOL Left 09/23/2018  ? Procedure: ESOPHAGOGASTRODUODENOSCOPY (EGD) WITH PROPOFOL;  Surgeon: Juanita Craver, MD;  Location: [place] ENDOSCOPY;  Service: Endoscopy;  Laterality: Left;  ? LEFT HEART CATH AND CORONARY ANGIOGRAPHY N/A 01/17/2017  ? Procedure: LEFT HEART CATH AND CORONARY ANGIOGRAPHY;  Surgeon: Belva Crome, MD;  Location: [place] CV LAB;  Service: Cardiovascular;  Laterality: N/A;  ? LEFT HEART CATHETERIZATION WITH CORONARY/GRAFT ANGIOGRAM N/A 10/12/2013  ? Procedure: LEFT HEART CATHETERIZATION WITH Beatrix Fetters;  Surgeon: Troy Sine, MD;  Location: [facility] CATH LAB;  Service:  Cardiovascular;  Laterality: N/A;  ? ? ?Current Medications: ?Current Meds  ?Medication Sig  ? ALPRAZolam (XANAX) 0.25 MG tablet TAKE 1 TABLET BY MOUTH ONCE DAILY AS NEEDED FOR ANXIETY  ? amLODipine (NORVASC) 5 MG tablet Take 1 tablet (5 mg total) by mouth daily.  ? aspirin 81 MG chewable tablet Chew 1 tablet (81 mg total) by mouth daily.  ? atenolol (TENORMIN) 12.5 mg TABS tablet Atenolol  ? celecoxib (CELEBREX) 100 MG capsule TAKE 1 CAPSULE BY MOUTH TWICE DAILY AS NEEDED FOR JOINT PAIN  ? HYDROcodone-acetaminophen (NORCO/VICODIN) 5-325 MG tablet Take 1 tablet by mouth every 12 (twelve) hours as needed for up to 30 doses for severe pain or moderate pain.  ? Multiple Vitamins-Minerals (MULTIVITAMIN WITH MINERALS) tablet Take 1 tablet by mouth daily.  ? nitroGLYCERIN (NITROSTAT) 0.4 MG SL tablet Place 1 tablet (0.4 mg total) under the tongue every 5 (five) minutes x 3 doses as needed for chest pain.  ? rosuvastatin (CRESTOR) 20 MG tablet Take 1 tablet (20 mg total) by mouth daily.  ? traMADol (ULTRAM) 50 MG tablet TAKE 1 TABLET BY MOUTH EVERY DAY AT BEDTIME DO  NOT  MIX  WITH  XANAX  ?  ? ?Allergies:   Simvastatin  ? ?Social History  ? ?Socioeconomic History  ? Marital status: Married  ?  Spouse name: Not on file  ? Number of children: Not on file  ? Years of education: Not on file  ? Highest education level: Not on file  ?Occupational History  ? Not on file  ?Tobacco Use  ? Smoking status: Former  ?  Types: Cigarettes  ?  Quit date: 69  ?  Years since quitting: 43.3  ? Smokeless tobacco: Never  ? Tobacco comments:  ?  [place]  ?Vaping Use  ? Vaping Use: Never used  ?Substance and Sexual Activity  ? Alcohol use: Yes  ?  Comment: friday nights - 2 glasses - rare  ? Drug use: No  ?  Comment:  quit using in 1985  ? Sexual activity: Yes  ?  Partners: Male  ?Other Topics Concern  ? Not on file  ?Social History Narrative  ? Marital status: married  ? Children: 3 chidlren and 16 grandchildren and 1 great  grandchild  ? Lives with: husband  ? Employment: customer service [name]  ? Tobacco:  quit  ? Alcohol:  Friday nights  ? Drugs:  none  ? Exercise:  no  ? Seatbelt: 100%  ? Guns in home: rifle  ?     Secured: yes  ? ?Social Determinants of Health  ? ?Financial Resource Strain: Not on file  ?Food Insecurity: Not on file  ?Transportation Needs: Not on file  ?Physical Activity: Not on file  ?Stress: Not on file  ?Social Connections: Not on file  ?  ? ?Family History: ?The patient's family history includes Cancer in her mother; Cancer - Prostate (age of onset: 60) in her brother; Heart disease in her mother and sister; Leukemia in her niece; Lung cancer  in her father; Ovarian cancer in her sister. ? ?ROS:   ?Please see the history of present illness.    ? ?EKGs/Labs/Other Studies Reviewed:   ? ?The following studies were reviewed today: ? ?Left Cardiac Catheterization 01/17/2017: ?Codominant coronary anatomy. ?Normal left main. ?20-25% ostial/proximal left anterior descending.  LAD is large and wraps the left ventricular apex.  No significant obstruction is seen. ?Large circumflex without evidence of significant obstructive disease. ?Codominant right coronary with no evidence of obstruction. ?Normal left ventricular function with EF 55%.  LVEDP 12 mmHg ?Occluded proximal LIMA to LAD identified by retrograde flow of LIMA from LAD to proximal segment where total occlusion exists. ?Angiography is unchanged from study done in 2015. ?  ?Recommendations: ?Stable coronary anatomy compared to 2015.  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  LAD is widely patent with ostial irregularity.  Chest pain is likely musculoskeletal/GI/other. ? ?Diagnostic ?Dominance: Co-dominant ? ? ?_______________ ? ?Echocardiogram 04/13/2019: ?Impressions: ?1. Left ventricular ejection fraction, by estimation, is 60 to 65%. The  ?left ventricle has normal function. The left ventricle has no regional  ?wall motion  abnormalities. Left ventricular diastolic parameters are  ?consistent with Grade I diastolic  ?dysfunction (impaired relaxation). The average left ventricular global  ?longitudinal strain is -18.8 %.  ? 2. Right ventricular s

## 2021-05-28 ENCOUNTER — Encounter: Payer: Self-pay | Admitting: Student

## 2021-05-29 ENCOUNTER — Encounter (HOSPITAL_BASED_OUTPATIENT_CLINIC_OR_DEPARTMENT_OTHER)
Admission: RE | Admit: 2021-05-29 | Discharge: 2021-05-29 | Disposition: A | Discharge status: Home / Self Care | Payer: Managed Care, Other (non HMO) | Source: Ambulatory Visit | Attending: General Surgery | Admitting: General Surgery

## 2021-05-29 DIAGNOSIS — Z01818 Encounter for other preprocedural examination: Secondary | ICD-10-CM | POA: Insufficient documentation

## 2021-05-29 DIAGNOSIS — I10 Essential (primary) hypertension: Secondary | ICD-10-CM | POA: Insufficient documentation

## 2021-05-29 LAB — BASIC METABOLIC PANEL
Anion gap: 6 (ref 5–15)
BUN: 11 mg/dL (ref 8–23)
CO2: 27 mmol/L (ref 22–32)
Calcium: 9.7 mg/dL (ref 8.9–10.3)
Chloride: 106 mmol/L (ref 98–111)
Creatinine, Ser: 0.84 mg/dL (ref 0.44–1.00)
GFR, Estimated: >60 mL/min (ref >60)
Glucose, Bld: 114 mg/dL — ABNORMAL HIGH (ref 70–99)
Potassium: 4.5 mmol/L (ref 3.5–5.1)
Sodium: 139 mmol/L (ref 135–145)

## 2021-05-29 MED ORDER — CHLORHEXIDINE GLUCONATE CLOTH 2 % EX PADS: 6.0000 | MEDICATED_PAD | Freq: Once | CUTANEOUS | Status: DC

## 2021-05-29 NOTE — Progress Notes (Signed)

## 2021-05-30 ENCOUNTER — Encounter: Payer: Self-pay | Admitting: Student

## 2021-05-30 ENCOUNTER — Ambulatory Visit: Payer: Managed Care, Other (non HMO) | Admitting: Student

## 2021-05-30 VITALS — BP 128/60 | HR 80 | Ht 62.0 in | Wt 131.0 lb

## 2021-05-30 DIAGNOSIS — I351 Nonrheumatic aortic (valve) insufficiency: Secondary | ICD-10-CM | POA: Diagnosis not present

## 2021-05-30 DIAGNOSIS — Z951 Presence of aortocoronary bypass graft: Secondary | ICD-10-CM

## 2021-05-30 DIAGNOSIS — Z01818 Encounter for other preprocedural examination: Secondary | ICD-10-CM

## 2021-05-30 DIAGNOSIS — I2510 Atherosclerotic heart disease of native coronary artery without angina pectoris: Secondary | ICD-10-CM

## 2021-05-30 DIAGNOSIS — E785 Hyperlipidemia, unspecified: Secondary | ICD-10-CM

## 2021-05-30 DIAGNOSIS — I10 Essential (primary) hypertension: Secondary | ICD-10-CM

## 2021-05-30 NOTE — Patient Instructions (Signed)
Medication Instructions:  ?No Changes ?*If you need a refill on your cardiac medications before your next appointment, please call your pharmacy* ? ? ?Lab Work: ?No Labs ?If you have labs (blood work) drawn today and your tests are completely normal, you will receive your results only by: ?MyChart Message (if you have MyChart) OR ?A paper copy in the mail ?If you have any lab test that is abnormal or we need to change your treatment, we will call you to review the results. ? ? ?Testing/Procedures: [address], [address]. ?Your physician has requested that you have an echocardiogram. Echocardiography is a painless test that uses sound waves to create images of your heart. It provides your doctor with information about the size and shape of your heart and how well your heart?s chambers and valves are working. This procedure takes approximately one hour. There are no restrictions for this procedure.  ? ? ?Follow-Up: ?At [facility], you and your health needs are our priority.  As part of our continuing mission to provide you with exceptional heart care, we have created designated Provider Care Teams.  These Care Teams include your primary Cardiologist (physician) and Advanced Practice Providers (APPs -  Physician Assistants and Nurse Practitioners) who all work together to provide you with the care you need, when you need it. ? ?We recommend signing up for the patient portal called "MyChart".  Sign up information is provided on this After Visit Summary.  MyChart is used to connect with patients for Virtual Visits (Telemedicine).  Patients are able to view lab/test results, encounter notes, upcoming appointments, etc.  Non-urgent messages can be sent to your provider as well.   ?To learn more about what you can do with MyChart, go to [email or web address].   ? ?Your next appointment:   ?1 year(s) ? ?The format for your next appointment:   ?In Person ? ?Provider:   ?Sande Rives, PA-C    Then, Kirk Ruths, MD will plan to see you again in 1 year(s).  ? ? ? ? ?Important Information About Sugar ? ? ? ? ?  ?

## 2021-05-31 ENCOUNTER — Encounter: Payer: Self-pay | Admitting: Family Medicine

## 2021-05-31 DIAGNOSIS — M25412 Effusion, left shoulder: Secondary | ICD-10-CM

## 2021-05-31 DIAGNOSIS — M545 Other chronic pain: Secondary | ICD-10-CM

## 2021-05-31 DIAGNOSIS — M25562 Pain in left knee: Secondary | ICD-10-CM

## 2021-06-01 MED ORDER — HYDROCODONE-ACETAMINOPHEN 5-325 MG PO TABS: 1.0000 | ORAL_TABLET | Freq: Two times a day (BID) | ORAL | 0 refills | Status: DC | PRN

## 2021-06-01 NOTE — Progress Notes (Signed)
?Patient Care Team: ?Copland, Gay Filler, MD as PCP - General (Family Medicine) ?Lelon Perla, MD as PCP - Cardiology (Cardiology) ?Lelon Perla, MD as Consulting Physician (Cardiology) ?Mauro Kaufmann, RN as Oncology Nurse Navigator ?Rockwell Germany, RN as Oncology Nurse Navigator ? ?DIAGNOSIS:  ?Encounter Diagnosis  ?Name Primary?  ? Malignant neoplasm of upper-outer quadrant of right breast in female, estrogen receptor positive ([place])   ? ? ?SUMMARY OF ONCOLOGIC HISTORY: ?Oncology History  ?Malignant neoplasm of upper-outer quadrant of right breast in female, estrogen receptor positive ([place])  ?04/25/2021 Initial Diagnosis  ? Screening mammogram detected right breast mass, by ultrasound measured 9 mm, biopsy revealed grade 1 IDC with low-grade DCIS, ER 95%, PR 90%, Ki-67 10%, HER2 2+ by IHC, FISH negative, ratio 1.17, copy #1.7 ?  ?05/07/2021 Cancer Staging  ? Staging form: Breast, AJCC 8th Edition ?- Clinical stage from 05/07/2021: Stage IA (cT1b, cN0, cM0, G1, ER+, PR+, HER2-) - Signed by Nicholas Lose, MD on 05/17/2021 ?Stage prefix: Initial diagnosis ?Method of lymph node assessment: Clinical ?Histologic grading system: 3 grade system ? ?  ?06/06/2021 Surgery  ? 06/06/2021: Right lumpectomy: Grade 2 IDC 1.2 cm, margins negative, 0/4 lymph nodes negative, ER 95%, PR 90%, HER2 negative, Ki-67 10% ?  ? ? ?CHIEF COMPLIANT: Follow-up of recent surgery for breast cancer ?  ? ?INTERVAL HISTORY: Casey Huerta is a 66 y.o. female is here because of recent diagnosis of right breast cancer. She presents to the clinic today for a follow-up to discuss the pathology report. She states the surgery went well.  Final pathology revealed that she had a 1.2 cm tumor that was ER/PR positive and HER2 negative and it was grade 2. ? ? ?ALLERGIES:  is allergic to simvastatin. ? ?MEDICATIONS:  ?Current Outpatient Medications  ?Medication Sig Dispense Refill  ? ALPRAZolam (XANAX) 0.25 MG tablet TAKE 1 TABLET BY MOUTH ONCE DAILY  AS NEEDED FOR ANXIETY 30 tablet 3  ? amLODipine (NORVASC) 5 MG tablet Take 1 tablet (5 mg total) by mouth daily. 90 tablet 3  ? aspirin 81 MG chewable tablet Chew 1 tablet (81 mg total) by mouth daily. 90 tablet 4  ? atenolol (TENORMIN) 12.5 mg TABS tablet Atenolol    ? celecoxib (CELEBREX) 100 MG capsule TAKE 1 CAPSULE BY MOUTH TWICE DAILY AS NEEDED FOR JOINT PAIN 180 capsule 3  ? HYDROcodone-acetaminophen (NORCO/VICODIN) 5-325 MG tablet Take 1 tablet by mouth every 12 (twelve) hours as needed for up to 30 doses for severe pain or moderate pain. 30 tablet 0  ? Multiple Vitamins-Minerals (MULTIVITAMIN WITH MINERALS) tablet Take 1 tablet by mouth daily.    ? nitroGLYCERIN (NITROSTAT) 0.4 MG SL tablet Place 1 tablet (0.4 mg total) under the tongue every 5 (five) minutes x 3 doses as needed for chest pain. 25 tablet 1  ? ondansetron (ZOFRAN-ODT) 4 MG disintegrating tablet Take 4 mg by mouth every 8 (eight) hours as needed.    ? oxyCODONE (ROXICODONE) 5 MG immediate release tablet Take 1 tablet (5 mg total) by mouth every 6 (six) hours as needed for severe pain. 10 tablet 0  ? rosuvastatin (CRESTOR) 20 MG tablet Take 1 tablet (20 mg total) by mouth daily. 90 tablet 3  ? traMADol (ULTRAM) 50 MG tablet TAKE 1 TABLET BY MOUTH EVERY DAY AT BEDTIME DO  NOT  MIX  WITH  XANAX 30 tablet 3  ? ?No current facility-administered medications for this visit.  ? ? ?PHYSICAL EXAMINATION: ?ECOG PERFORMANCE  STATUS: 1 - Symptomatic but completely ambulatory ? ?Vitals:  ? 06/15/21 1153  ?BP: (!) 158/93  ?Pulse: 69  ?Resp: 16  ?Temp: 97.7 ?F (36.5 ?C)  ?SpO2: 99%  ? ?Filed Weights  ? 06/15/21 1153  ?Weight: 132 lb 6.4 oz (60.1 kg)  ? ?  ? ?LABORATORY DATA:  ?I have reviewed the data as listed ? ?  Latest Ref Rng & Units 05/29/2021  ?  1:30 PM 03/05/2021  ?  2:21 PM 02/24/2020  ? 11:39 AM  ?CMP  ?Glucose 70 - 99 mg/dL 114   87   75    ?BUN 8 - 23 mg/dL _0 ?Creatinine 0.44 - 1.00 mg/dL 0.84   0.84   1.02    ?Sodium 135 - 145 mmol/L 139    142   139    ?Potassium 3.5 - 5.1 mmol/L 4.5   4.8   5.0    ?Chloride 98 - 111 mmol/L 106   105   103    ?CO2 22 - 32 mmol/L 27   30   32    ?Calcium 8.9 - 10.3 mg/dL 9.7   10.2   10.0    ?Total Protein 6.0 - 8.3 g/dL  7.8   7.3    ?Total Bilirubin 0.2 - 1.2 mg/dL  0.3   0.4    ?Alkaline Phos 39 - 117 U/L  93   98    ?AST 0 - 37 U/L  18   15    ?ALT 0 - 35 U/L  15   11    ? ? ?Lab Results  ?Component Value Date  ? WBC 5.6 03/05/2021  ? HGB 13.3 03/05/2021  ? HCT 41.3 03/05/2021  ? MCV 90.7 03/05/2021  ? PLT 385.0 03/05/2021  ? NEUTROABS 2.0 03/25/2017  ? ? ?ASSESSMENT & PLAN:  ?Malignant neoplasm of upper-outer quadrant of right breast in female, estrogen receptor positive ([place]) ?04/25/2021:Screening mammogram detected right breast mass, by ultrasound measured 9 mm, biopsy revealed grade 1 IDC with low-grade DCIS, ER 95%, PR 90%, Ki-67 10%, HER2 2+ by IHC, FISH negative, ratio 1.17, copy #1.7 ? 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 ?Pathology counseling: I discussed the final pathology report of the patient provided  a copy of this report. I discussed the margins as well as lymph node surgeries. We also discussed the final staging along with previously performed ER/PR and HER-2/neu testing. ? ?Treatment plan: ?1. Oncotype DX testing to determine if chemotherapy (if the final tumor is greater than 1 cm) ?2. Adjuvant radiation therapy followed by ?3. Adjuvant antiestrogen therapy ? ?Return to clinic based on Oncotype DX test result ? ? ? ?No orders of the defined types were placed in this encounter. ? ?The patient has a good understanding of the overall plan. she agrees with it. she will call with any problems that may develop before the next visit here. ?Total time spent: 30 mins including face to face time and time spent for planning, charting and co-ordination of care ? ? Harriette Ohara, MD ?06/15/21 ? ? ? I Gardiner Coins am scribing for Dr. Lindi Adie ? ?I have reviewed the above documentation for accuracy and completeness, and I agree with the above. ?  ?

## 2021-06-05 ENCOUNTER — Ambulatory Visit
Admission: RE | Admit: 2021-06-05 | Discharge: 2021-06-05 | Disposition: A | Discharge status: Home / Self Care | Payer: Managed Care, Other (non HMO) | Source: Ambulatory Visit | Attending: General Surgery | Admitting: General Surgery

## 2021-06-05 ENCOUNTER — Other Ambulatory Visit: Payer: Self-pay | Admitting: General Surgery

## 2021-06-05 DIAGNOSIS — Z170 Estrogen receptor positive status [ER+]: Secondary | ICD-10-CM

## 2021-06-06 ENCOUNTER — Ambulatory Visit (HOSPITAL_BASED_OUTPATIENT_CLINIC_OR_DEPARTMENT_OTHER): Payer: Commercial Managed Care - HMO | Admitting: Anesthesiology

## 2021-06-06 ENCOUNTER — Ambulatory Visit
Admission: RE | Admit: 2021-06-06 | Discharge: 2021-06-06 | Disposition: A | Discharge status: Home / Self Care | Payer: Managed Care, Other (non HMO) | Source: Ambulatory Visit | Attending: General Surgery | Admitting: General Surgery

## 2021-06-06 ENCOUNTER — Ambulatory Visit (HOSPITAL_BASED_OUTPATIENT_CLINIC_OR_DEPARTMENT_OTHER)
Admission: RE | Admit: 2021-06-06 | Discharge: 2021-06-06 | Disposition: A | Discharge status: Home / Self Care | Payer: Commercial Managed Care - HMO | Source: Ambulatory Visit | Attending: General Surgery | Admitting: General Surgery

## 2021-06-06 ENCOUNTER — Encounter (HOSPITAL_BASED_OUTPATIENT_CLINIC_OR_DEPARTMENT_OTHER): Payer: Self-pay | Admitting: General Surgery

## 2021-06-06 ENCOUNTER — Other Ambulatory Visit: Payer: Self-pay

## 2021-06-06 ENCOUNTER — Encounter (HOSPITAL_BASED_OUTPATIENT_CLINIC_OR_DEPARTMENT_OTHER)
Admission: RE | Disposition: A | Discharge status: Home / Self Care | Payer: Self-pay | Source: Ambulatory Visit | Attending: General Surgery

## 2021-06-06 DIAGNOSIS — Z87891 Personal history of nicotine dependence: Secondary | ICD-10-CM | POA: Insufficient documentation

## 2021-06-06 DIAGNOSIS — J449 Chronic obstructive pulmonary disease, unspecified: Secondary | ICD-10-CM | POA: Diagnosis not present

## 2021-06-06 DIAGNOSIS — Z951 Presence of aortocoronary bypass graft: Secondary | ICD-10-CM | POA: Insufficient documentation

## 2021-06-06 DIAGNOSIS — Z170 Estrogen receptor positive status [ER+]: Secondary | ICD-10-CM

## 2021-06-06 DIAGNOSIS — C50911 Malignant neoplasm of unspecified site of right female breast: Secondary | ICD-10-CM

## 2021-06-06 DIAGNOSIS — C50411 Malignant neoplasm of upper-outer quadrant of right female breast: Secondary | ICD-10-CM | POA: Insufficient documentation

## 2021-06-06 DIAGNOSIS — I2510 Atherosclerotic heart disease of native coronary artery without angina pectoris: Secondary | ICD-10-CM | POA: Diagnosis not present

## 2021-06-06 DIAGNOSIS — I10 Essential (primary) hypertension: Secondary | ICD-10-CM | POA: Diagnosis not present

## 2021-06-06 DIAGNOSIS — D649 Anemia, unspecified: Secondary | ICD-10-CM | POA: Insufficient documentation

## 2021-06-06 DIAGNOSIS — M1990 Unspecified osteoarthritis, unspecified site: Secondary | ICD-10-CM | POA: Diagnosis not present

## 2021-06-06 DIAGNOSIS — D630 Anemia in neoplastic disease: Secondary | ICD-10-CM

## 2021-06-06 DIAGNOSIS — D759 Disease of blood and blood-forming organs, unspecified: Secondary | ICD-10-CM | POA: Diagnosis not present

## 2021-06-06 HISTORY — DX: Unspecified osteoarthritis, unspecified site: M19.90

## 2021-06-06 HISTORY — PX: BREAST LUMPECTOMY WITH RADIOACTIVE SEED AND SENTINEL LYMPH NODE BIOPSY: SHX6550

## 2021-06-06 HISTORY — PX: BREAST LUMPECTOMY: SHX2

## 2021-06-06 HISTORY — DX: Hyperlipidemia, unspecified: E78.5

## 2021-06-06 SURGERY — BREAST LUMPECTOMY WITH RADIOACTIVE SEED AND SENTINEL LYMPH NODE BIOPSY
Anesthesia: General | Site: Breast | Laterality: Right

## 2021-06-06 MED ORDER — CELECOXIB 200 MG PO CAPS
200.0000 mg | ORAL_CAPSULE | ORAL | Status: AC
  Administered 2021-06-06: 200 mg via ORAL

## 2021-06-06 MED ORDER — HYDROMORPHONE HCL 1 MG/ML IJ SOLN
0.2500 mg | INTRAMUSCULAR | Status: DC | PRN
  Administered 2021-06-06: 0.5 mg via INTRAVENOUS

## 2021-06-06 MED ORDER — AMISULPRIDE (ANTIEMETIC) 5 MG/2ML IV SOLN
10.0000 mg | Freq: Once | INTRAVENOUS | Status: AC | PRN
  Administered 2021-06-06: 10 mg via INTRAVENOUS

## 2021-06-06 MED ORDER — CEFAZOLIN SODIUM-DEXTROSE 2-4 GM/100ML-% IV SOLN
2.0000 g | INTRAVENOUS | Status: AC
  Administered 2021-06-06: 2 g via INTRAVENOUS

## 2021-06-06 MED ORDER — SUCCINYLCHOLINE CHLORIDE 200 MG/10ML IV SOSY
PREFILLED_SYRINGE | INTRAVENOUS | Status: AC
  Filled 2021-06-06: qty 10

## 2021-06-06 MED ORDER — OXYCODONE HCL 5 MG/5ML PO SOLN: 5.0000 mg | Freq: Once | ORAL | Status: DC | PRN

## 2021-06-06 MED ORDER — FENTANYL CITRATE (PF) 100 MCG/2ML IJ SOLN
100.0000 ug | Freq: Once | INTRAMUSCULAR | Status: AC
  Administered 2021-06-06: 100 ug via INTRAVENOUS

## 2021-06-06 MED ORDER — DEXAMETHASONE SODIUM PHOSPHATE 4 MG/ML IJ SOLN
INTRAMUSCULAR | Status: DC | PRN
  Administered 2021-06-06: 5 mg via INTRAVENOUS

## 2021-06-06 MED ORDER — DEXAMETHASONE SODIUM PHOSPHATE 10 MG/ML IJ SOLN
INTRAMUSCULAR | Status: AC
  Filled 2021-06-06: qty 1

## 2021-06-06 MED ORDER — OXYCODONE HCL 5 MG PO TABS: 5.0000 mg | ORAL_TABLET | Freq: Once | ORAL | Status: DC | PRN

## 2021-06-06 MED ORDER — LIDOCAINE 2% (20 MG/ML) 5 ML SYRINGE
INTRAMUSCULAR | Status: AC
Start: 2021-06-06 — End: ?
  Filled 2021-06-06: qty 5

## 2021-06-06 MED ORDER — EPHEDRINE SULFATE (PRESSORS) 50 MG/ML IJ SOLN
INTRAMUSCULAR | Status: DC | PRN
  Administered 2021-06-06: 20 mg via INTRAVENOUS
  Administered 2021-06-06: 10 mg via INTRAVENOUS
  Administered 2021-06-06: 5 mg via INTRAVENOUS

## 2021-06-06 MED ORDER — PHENYLEPHRINE 80 MCG/ML (10ML) SYRINGE FOR IV PUSH (FOR BLOOD PRESSURE SUPPORT)
PREFILLED_SYRINGE | INTRAVENOUS | Status: DC | PRN
  Administered 2021-06-06 (×4): 80 ug via INTRAVENOUS

## 2021-06-06 MED ORDER — BUPIVACAINE-EPINEPHRINE 0.25% -1:200000 IJ SOLN
INTRAMUSCULAR | Status: DC | PRN
  Administered 2021-06-06: 10 mL

## 2021-06-06 MED ORDER — ONDANSETRON HCL 4 MG/2ML IJ SOLN
INTRAMUSCULAR | Status: AC
  Filled 2021-06-06: qty 2

## 2021-06-06 MED ORDER — GABAPENTIN 300 MG PO CAPS
300.0000 mg | ORAL_CAPSULE | ORAL | Status: AC
  Administered 2021-06-06: 300 mg via ORAL

## 2021-06-06 MED ORDER — MEPERIDINE HCL 25 MG/ML IJ SOLN: 6.2500 mg | INTRAMUSCULAR | Status: DC | PRN

## 2021-06-06 MED ORDER — PHENYLEPHRINE 80 MCG/ML (10ML) SYRINGE FOR IV PUSH (FOR BLOOD PRESSURE SUPPORT)
PREFILLED_SYRINGE | INTRAVENOUS | Status: AC
  Filled 2021-06-06: qty 10

## 2021-06-06 MED ORDER — FENTANYL CITRATE (PF) 100 MCG/2ML IJ SOLN
INTRAMUSCULAR | Status: AC
  Filled 2021-06-06: qty 2

## 2021-06-06 MED ORDER — ONDANSETRON HCL 4 MG/2ML IJ SOLN
INTRAMUSCULAR | Status: DC | PRN
  Administered 2021-06-06: 4 mg via INTRAVENOUS

## 2021-06-06 MED ORDER — PROPOFOL 10 MG/ML IV BOLUS
INTRAVENOUS | Status: DC | PRN
  Administered 2021-06-06: 200 mg via INTRAVENOUS

## 2021-06-06 MED ORDER — MIDAZOLAM HCL 2 MG/2ML IJ SOLN
INTRAMUSCULAR | Status: AC
  Filled 2021-06-06: qty 2

## 2021-06-06 MED ORDER — CELECOXIB 200 MG PO CAPS
ORAL_CAPSULE | ORAL | Status: AC
  Filled 2021-06-06: qty 1

## 2021-06-06 MED ORDER — MAGTRACE LYMPHATIC TRACER
INTRAMUSCULAR | Status: DC | PRN
  Administered 2021-06-06: 2 mL via INTRAMUSCULAR

## 2021-06-06 MED ORDER — CEFAZOLIN SODIUM-DEXTROSE 2-4 GM/100ML-% IV SOLN
INTRAVENOUS | Status: AC
  Filled 2021-06-06: qty 100

## 2021-06-06 MED ORDER — ACETAMINOPHEN 500 MG PO TABS
ORAL_TABLET | ORAL | Status: AC
  Filled 2021-06-06: qty 2

## 2021-06-06 MED ORDER — ROPIVACAINE HCL 5 MG/ML IJ SOLN
INTRAMUSCULAR | Status: DC | PRN
  Administered 2021-06-06: 30 mL via PERINEURAL

## 2021-06-06 MED ORDER — PROMETHAZINE HCL 25 MG/ML IJ SOLN: 6.2500 mg | INTRAMUSCULAR | Status: DC | PRN

## 2021-06-06 MED ORDER — LIDOCAINE HCL (CARDIAC) PF 100 MG/5ML IV SOSY
PREFILLED_SYRINGE | INTRAVENOUS | Status: DC | PRN
  Administered 2021-06-06: 40 mg via INTRAVENOUS

## 2021-06-06 MED ORDER — ACETAMINOPHEN 500 MG PO TABS: 1000.0000 mg | ORAL_TABLET | ORAL | Status: DC

## 2021-06-06 MED ORDER — OXYCODONE HCL 5 MG PO TABS: 5.0000 mg | ORAL_TABLET | Freq: Four times a day (QID) | ORAL | 0 refills | Status: DC | PRN

## 2021-06-06 MED ORDER — AMISULPRIDE (ANTIEMETIC) 5 MG/2ML IV SOLN
INTRAVENOUS | Status: AC
Start: 2021-06-06 — End: ?
  Filled 2021-06-06: qty 4

## 2021-06-06 MED ORDER — LACTATED RINGERS IV SOLN: INTRAVENOUS | Status: DC

## 2021-06-06 MED ORDER — ATROPINE SULFATE 0.4 MG/ML IV SOLN
INTRAVENOUS | Status: AC
  Filled 2021-06-06: qty 1

## 2021-06-06 MED ORDER — HYDROMORPHONE HCL 1 MG/ML IJ SOLN
INTRAMUSCULAR | Status: AC
  Filled 2021-06-06: qty 0.5

## 2021-06-06 MED ORDER — MIDAZOLAM HCL 2 MG/2ML IJ SOLN
2.0000 mg | Freq: Once | INTRAMUSCULAR | Status: AC
Start: 2021-06-06 — End: 2021-06-06
  Administered 2021-06-06: 2 mg via INTRAVENOUS

## 2021-06-06 MED ORDER — GABAPENTIN 300 MG PO CAPS
ORAL_CAPSULE | ORAL | Status: AC
  Filled 2021-06-06: qty 1

## 2021-06-06 MED ORDER — EPHEDRINE 5 MG/ML INJ
INTRAVENOUS | Status: AC
  Filled 2021-06-06: qty 5

## 2021-06-06 MED ORDER — MIDAZOLAM HCL 2 MG/2ML IJ SOLN
INTRAMUSCULAR | Status: AC
Start: 2021-06-06 — End: ?
  Filled 2021-06-06: qty 2

## 2021-06-06 SURGICAL SUPPLY — 38 items
ADH SKN CLS APL DERMABOND .7 (GAUZE/BANDAGES/DRESSINGS) ×1
APL PRP STRL LF DISP 70% ISPRP (MISCELLANEOUS) ×1
APPLIER CLIP 9.375 MED OPEN (MISCELLANEOUS) ×2
APR CLP MED 9.3 20 MLT OPN (MISCELLANEOUS) ×1
BLADE SURG 15 STRL LF DISP TIS (BLADE) ×1 IMPLANT
BLADE SURG 15 STRL SS (BLADE) ×2
CANISTER SUCT 1200ML W/VALVE (MISCELLANEOUS) ×1 IMPLANT
CHLORAPREP W/TINT 26 (MISCELLANEOUS) ×2 IMPLANT
CLIP APPLIE 9.375 MED OPEN (MISCELLANEOUS) ×1 IMPLANT
COVER BACK TABLE 60X90IN (DRAPES) ×2 IMPLANT
COVER MAYO STAND STRL (DRAPES) ×2 IMPLANT
COVER PROBE W GEL 5X96 (DRAPES) ×3 IMPLANT
DERMABOND ADVANCED (GAUZE/BANDAGES/DRESSINGS) ×1
DERMABOND ADVANCED .7 DNX12 (GAUZE/BANDAGES/DRESSINGS) ×1 IMPLANT
DRAPE LAPAROSCOPIC ABDOMINAL (DRAPES) ×2 IMPLANT
DRAPE UTILITY XL STRL (DRAPES) ×2 IMPLANT
ELECT COATED BLADE 2.86 ST (ELECTRODE) ×2 IMPLANT
ELECT REM PT RETURN 9FT ADLT (ELECTROSURGICAL) ×2
ELECTRODE REM PT RTRN 9FT ADLT (ELECTROSURGICAL) ×1 IMPLANT
GLOVE BIO SURGEON STRL SZ7.5 (GLOVE) ×2 IMPLANT
GOWN STRL REUS W/ TWL LRG LVL3 (GOWN DISPOSABLE) ×2 IMPLANT
GOWN STRL REUS W/TWL LRG LVL3 (GOWN DISPOSABLE) ×6
KIT MARKER MARGIN INK (KITS) ×2 IMPLANT
NDL HYPO 25X1 1.5 SAFETY (NEEDLE) ×1 IMPLANT
NEEDLE HYPO 25X1 1.5 SAFETY (NEEDLE) ×2 IMPLANT
NS IRRIG 1000ML POUR BTL (IV SOLUTION) ×1 IMPLANT
PACK BASIN DAY SURGERY FS (CUSTOM PROCEDURE TRAY) ×2 IMPLANT
PENCIL SMOKE EVACUATOR (MISCELLANEOUS) ×2 IMPLANT
SLEEVE SCD COMPRESS KNEE MED (STOCKING) ×2 IMPLANT
SPONGE T-LAP 18X18 ~~LOC~~+RFID (SPONGE) ×2 IMPLANT
SUT MON AB 4-0 PC3 18 (SUTURE) ×3 IMPLANT
SUT VICRYL 3-0 CR8 SH (SUTURE) ×2 IMPLANT
SYR CONTROL 10ML LL (SYRINGE) ×2 IMPLANT
TOWEL GREEN STERILE FF (TOWEL DISPOSABLE) ×2 IMPLANT
TRACER MAGTRACE VIAL (MISCELLANEOUS) ×1 IMPLANT
TRAY FAXITRON CT DISP (TRAY / TRAY PROCEDURE) ×2 IMPLANT
TUBE CONNECTING 20X1/4 (TUBING) ×1 IMPLANT
YANKAUER SUCT BULB TIP NO VENT (SUCTIONS) ×1 IMPLANT

## 2021-06-06 NOTE — Interval H&P Note (Signed)
History and Physical Interval Note: ? ?06/06/2021 ?11:17 AM ? ?Casey Huerta  has presented today for surgery, with the diagnosis of RIGHT BREAST CANCER.  The various methods of treatment have been discussed with the patient and family. After consideration of risks, benefits and other options for treatment, the patient has consented to  Procedure(s): ?RIGHT BREAST LUMPECTOMY WITH RADIOACTIVE SEED AND SENTINEL LYMPH NODE BIOPSY (Right) as a surgical intervention.  The patient's history has been reviewed, patient examined, no change in status, stable for surgery.  I have reviewed the patient's chart and labs.  Questions were answered to the patient's satisfaction.   ? ? ?[name] ? ? ?

## 2021-06-06 NOTE — Anesthesia Procedure Notes (Signed)
Anesthesia Regional Block: Pectoralis block  ? ?Pre-Anesthetic Checklist: , timeout performed,  Correct Patient, Correct Site, Correct Laterality,  Correct Procedure, Correct Position, site marked,  Risks and benefits discussed,  Surgical consent,  Pre-op evaluation,  At surgeon's request and post-op pain management ? ?Laterality: Right ? ?Prep: chloraprep     ?  ?Needles:  ?Injection technique: Single-shot ? ?Needle Type: Stimiplex   ? ? ?Needle Length: 9cm  ?Needle Gauge: 21  ? ? ? ?Additional Needles: ? ? ?Procedures:,,,, ultrasound used (permanent image in chart),,    ?Narrative:  ?Start time: 06/06/2021 11:07 AM ?End time: 06/06/2021 11:12 AM ?Injection made incrementally with aspirations every 5 mL. ? ?Performed by: Personally  ?Anesthesiologist: Lynda Rainwater, MD ? ? ? ? ?

## 2021-06-06 NOTE — Anesthesia Procedure Notes (Signed)
Procedure Name: LMA Insertion ?Date/Time: 06/06/2021 11:37 AM ?Performed by: Willa Frater, CRNA ?Pre-anesthesia Checklist: Patient identified, Emergency Drugs available, Suction available and Patient being monitored ?Patient Re-evaluated:Patient Re-evaluated prior to induction ?Oxygen Delivery Method: Circle system utilized ?Preoxygenation: Pre-oxygenation with 100% oxygen ?Induction Type: IV induction ?Ventilation: Mask ventilation without difficulty ?LMA: LMA inserted ?LMA Size: 3.0 ?Number of attempts: 1 ?Airway Equipment and Method: Bite block ?Placement Confirmation: positive ETCO2 ?Tube secured with: Tape ?Dental Injury: Teeth and Oropharynx as per pre-operative assessment  ? ? ? ? ?

## 2021-06-06 NOTE — H&P (Signed)
?REFERRING PHYSICIAN: Copland, Jessica Campbe* ? ?PROVIDER: Landry Corporal, MD ? ?MRN: [id number] ?DOB: [birth year] ?Subjective  ? ?Chief Complaint: Breast Cancer ? ? ?History of Present Illness: ?Casey Huerta is a 66 y.o. female who is seen today as an office consultation for evaluation of Breast Cancer ?.  ? ?We are asked to see the patient in consultation by Dr. Silvestre Mesi to evaluate her for a new right breast cancer. The patient is a 66 year old black female who recently went for a routine screening mammogram. At that time she was found to have a 9 mm mass in the upper outer quadrant of the right breast. The axilla looked normal. 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 The HER2 has not been reported yet. She has no family history of breast cancer. She does have coronary artery disease and is followed by Dr. Stanford Breed. She does not smoke. ? ?Review of Systems: ?A complete review of systems was obtained from the patient. I have reviewed this information and discussed as appropriate with the patient. See HPI as well for other ROS. ? ?ROS  ? ?Medical History: ?Past Medical History:  ?Diagnosis Date  ? Hypertension  ? ?Patient Active Problem List  ?Diagnosis  ? Malignant neoplasm of upper-outer quadrant of right breast in female, estrogen receptor positive (CMS-HCC)  ? ?Past Surgical History:  ?Procedure Laterality Date  ? Left Heart Catherterization With Coronary/Graft Angiogram 10/12/2013  ?Dr. Claiborne Billings  ? Left Heart Cath and Coronary Angiography 01/17/2017  ?Dr. Linard Millers  ? Knee Scope  ? ? ?Allergies  ?Allergen Reactions  ? Simvastatin Other (See Comments)  ?REACTION: constipation ?REACTION: constipation ?REACTION: constipation ?REACTION: constipation ? ? ?Current Outpatient Medications on File Prior to Visit  ?Medication Sig Dispense Refill  ? rosuvastatin (CRESTOR) 20 MG tablet Take 20 mg by mouth once daily  ? traMADoL  (ULTRAM) 50 mg tablet TAKE 1 TABLET BY MOUTH EVERY DAY AT BEDTIME DO NOT MIX WITH XANAX  ? ALPRAZolam (XANAX) 0.25 MG tablet Take 0.25 mg by mouth once daily as needed  ? amLODIPine (NORVASC) 5 MG tablet Take 5 mg by mouth once daily  ? aspirin 81 MG EC tablet Take 81 mg by mouth once daily APPOINTMENT REQUIRED FOR FUTURE REFILLS  ? atenolol (TENORMIN) 12.5 mg capsule Atenolol  ? HYDROcodone-acetaminophen (NORCO) 5-325 mg tablet  ? multivitamin with minerals tablet Take 1 tablet by mouth once daily  ? ?No current facility-administered medications on file prior to visit.  ? ?Family History  ?Problem Relation Age of Onset  ? Diabetes Sister  ? Diabetes Brother  ? ? ?Social History  ? ?Tobacco Use  ?Smoking Status Former  ? Types: Cigarettes  ? Quit date: 1968  ? Years since quitting: 55.2  ?Smokeless Tobacco Never  ? ? ?Social History  ? ?Socioeconomic History  ? Marital status: Married  ?Tobacco Use  ? Smoking status: Former  ?Types: Cigarettes  ?Quit date: 1968  ?Years since quitting: 55.2  ? Smokeless tobacco: Never  ?Substance and Sexual Activity  ? Alcohol use: Never  ? Drug use: Never  ? ?Objective:  ? ?Vitals:  ?BP: 122/66  ?Pulse: 86  ?Temp: 36.6 ?C (97.8 ?F)  ?SpO2: 98%  ?Weight: 60.7 kg (133 lb 12.8 oz)  ?Height: 157.5 cm ('5\' 2"' )  ? ?Body mass index is 24.47 kg/m?. ? ?Physical Exam ?Vitals reviewed.  ?Constitutional:  ?General: She is not  in acute distress. ?Appearance: Normal appearance.  ?HENT:  ?Head: Normocephalic and atraumatic.  ?Right Ear: External ear normal.  ?Left Ear: External ear normal.  ?Nose: Nose normal.  ?Mouth/Throat:  ?Mouth: Mucous membranes are moist.  ?Pharynx: Oropharynx is clear.  ?Eyes:  ?General: No scleral icterus. ?Extraocular Movements: Extraocular movements intact.  ?Conjunctiva/sclera: Conjunctivae normal.  ?Pupils: Pupils are equal, round, and reactive to light.  ?Cardiovascular:  ?Rate and Rhythm: Normal rate and regular rhythm.  ?Pulses: Normal pulses.  ?Heart sounds: Normal  heart sounds.  ?Pulmonary:  ?Effort: Pulmonary effort is normal. No respiratory distress.  ?Breath sounds: Normal breath sounds.  ?Abdominal:  ?General: Bowel sounds are normal.  ?Palpations: Abdomen is soft.  ?Tenderness: There is no abdominal tenderness.  ?Musculoskeletal:  ?General: No swelling, tenderness or deformity. Normal range of motion.  ?Cervical back: Normal range of motion and neck supple.  ?Skin: ?General: Skin is warm and dry.  ?Coloration: Skin is not jaundiced.  ?Neurological:  ?General: No focal deficit present.  ?Mental Status: She is alert and oriented to person, place, and time.  ?Psychiatric:  ?Mood and Affect: Mood normal.  ?Behavior: Behavior normal.  ? ? ? ?Breast: There is no palpable mass in either breast. There is no palpable axillary, supraclavicular, or cervical lymphadenopathy. ? ?Labs, Imaging and Diagnostic Testing: ? ?Assessment and Plan:  ? ?Diagnoses and all orders for this visit: ? ?Malignant neoplasm of upper-outer quadrant of right breast in female, estrogen receptor positive (CMS-HCC) ?- Ambulatory Referral to Oncology-Medical ?- Ambulatory Referral to Physical Therapy ?- Ambulatory Referral to Radiation Oncology ?- [facility] Case Posting Request; Future ? ? ? ?The patient appears to have a small 9 mm stage I cancer in the upper outer quadrant of the right breast. I have discussed with her in detail the different options for treatment and at this point she favors breast conservation which I feel is very reasonable. She is a good candidate for sentinel node biopsy as well. I have discussed with her in detail the risks and benefits of the operation as well as some of the technical aspects including the use of a radioactive seed for localization and she understands and wishes to proceed. I will go ahead and refer her to medical and radiation oncology to discuss adjuvant therapy. We will call her with the results of the HER2 test once its been resulted. We will also refer her to  physical therapy for preoperative lymphedema testing. ?

## 2021-06-06 NOTE — Progress Notes (Signed)
Assisted Dr. Miller with right, pectoralis, ultrasound guided block. Side rails up, monitors on throughout procedure. See vital signs in flow sheet. Tolerated Procedure well. 

## 2021-06-06 NOTE — Op Note (Signed)
06/06/2021 ? ?12:35 PM ? ?PATIENT:  Casey Huerta  66 y.o. female ? ?PRE-OPERATIVE DIAGNOSIS:  RIGHT BREAST CANCER ? ?POST-OPERATIVE DIAGNOSIS:  RIGHT BREAST CANCER ? ?PROCEDURE:  Procedure(s): ?RIGHT BREAST LUMPECTOMY WITH RADIOACTIVE SEED LOCALIZATION AND DEEP RIGHT AXILLARY SENTINEL LYMPH NODE BIOPSY (Right) ? ?SURGEON:  Surgeon(s) and Role: ?   Jovita Kussmaul, MD - Primary ? ?PHYSICIAN ASSISTANT:  ? ?ASSISTANTS: none  ? ?ANESTHESIA:   local and general ? ?EBL:  minimal  ? ?BLOOD ADMINISTERED:none ? ?DRAINS: none  ? ?LOCAL MEDICATIONS USED:  MARCAINE    ? ?SPECIMEN:  Source of Specimen:  right breast tissue with additional superior margin and sentinel nodes x 2 ? ?DISPOSITION OF SPECIMEN:  PATHOLOGY ? ?COUNTS:  YES ? ?TOURNIQUET:  * No tourniquets in log * ? ?DICTATION: .Dragon Dictation ? ?After informed consent was obtained the patient was brought to the operating room and placed in the supine position on the operating table.  After adequate induction of general anesthesia the patient's right chest, breast, and axillary area were prepped with ChloraPrep, allowed to dry, and draped in usual sterile manner.  An appropriate timeout was performed.  Previously the I-125 seed was placed in the upper outer quadrant of the right breast to mark an area of invasive breast cancer.  At this point, 2 cc of iron oxide were injected in the subareolar plexus of the right breast and the breast was massaged for 5 minutes.  The neoprobe was set to I-125 in the area of radioactivity was readily identified far in the upper outer quadrant of the right breast.  The area around this was infiltrated with course of Marcaine.  A curvilinear incision was made along the upper outer edge of the breast with a 15 blade knife.  The incision was carried through the skin and subcutaneous tissue sharply with the Bovie electrocautery.  Dissection was then carried towards the radioactive seed under the direction of the neoprobe.  Once I more  closely approached the radioactive seed I then removed a circular portion of breast tissue sharply with the electrocautery around the radioactive seed while checking the area of radioactivity frequently.  This dissection was carried all the way to the muscle of the chest wall.  Once the specimen was removed it was oriented with the appropriate paint colors.  A specimen radiograph was obtained that showed the clip and seed to be within the specimen.  The specimen was then sent to pathology for further evaluation.  I did elect to take an additional superior margin and this was also marked appropriately and sent to pathology.  Hemostasis was achieved using the Bovie electrocautery.  The cavity was marked with clips.  Dissection was then carried through this incision into the deep right axillary space under the direction of the mag trace.  I was able to identify 2 lymph nodes with increased signal.  These nodes were excised sharply with the electrocautery and the surrounding small vessels and lymphatics were controlled with clips.  These were sent as sentinel nodes numbers 1 and 2.  No other hot or palpable nodes were identified in the right axilla.  Hemostasis was achieved using the Bovie electrocautery.  The deep layer of the axilla was closed with interrupted 3-0 Vicryl stitches.  The lumpectomy cavity was then closed also with 3-0 interrupted Vicryl stitches.  The skin was closed with a running 4-0 Monocryl subcuticular stitch.  Dermabond dressings were applied.  The patient tolerated the procedure well.  At the end of the case all needle sponge and instrument counts were correct.  The patient was then awakened and taken to recovery in stable condition. ? ?PLAN OF CARE: Discharge to home after PACU ? ?PATIENT DISPOSITION:  PACU - hemodynamically stable. ?  ?Delay start of Pharmacological VTE agent (>24hrs) due to surgical blood loss or risk of bleeding: not applicable ? ?

## 2021-06-06 NOTE — Anesthesia Postprocedure Evaluation (Signed)
Anesthesia Post Note ? ?Patient: CLEVA CAMERO ? ?Procedure(s) Performed: RIGHT BREAST LUMPECTOMY WITH RADIOACTIVE SEED AND SENTINEL LYMPH NODE BIOPSY (Right: Breast) ? ?  ? ?Patient location during evaluation: PACU ?Anesthesia Type: General ?Level of consciousness: awake and alert ?Pain management: pain level controlled ?Vital Signs Assessment: post-procedure vital signs reviewed and stable ?Respiratory status: spontaneous breathing, nonlabored ventilation and respiratory function stable ?Cardiovascular status: blood pressure returned to baseline and stable ?Postop Assessment: no apparent nausea or vomiting ?Anesthetic complications: no ? ? ?No notable events documented. ? ?Last Vitals:  ?Vitals:  ? 06/06/21 1345 06/06/21 1408  ?BP: (!) 136/58 (!) 148/59  ?Pulse: 71 75  ?Resp: 12 15  ?Temp:  36.4 ?C  ?SpO2: 95% 96%  ?  ?Last Pain:  ?Vitals:  ? 06/06/21 1408  ?TempSrc:   ?PainSc: 0-No pain  ? ? ?  ?  ?  ?  ?  ?  ? ?Lynda Rainwater ? ? ? ? ?

## 2021-06-06 NOTE — Anesthesia Preprocedure Evaluation (Signed)
Anesthesia Evaluation  ?Patient identified by MRN, date of birth, ID band ?Patient awake ? ? ? ?Reviewed: ?Allergy & Precautions, NPO status , Patient's Chart, lab work & pertinent test results ? ?History of Anesthesia Complications ?Negative for: history of anesthetic complications ? ?Airway ?Mallampati: II ? ?TM Distance: >3 FB ?Neck ROM: Full ? ? ? Dental ?no notable dental hx. ?(+) Upper Dentures, Lower Dentures ?  ?Pulmonary ?COPD, former smoker,  ?  ?Pulmonary exam normal ?breath sounds clear to auscultation ? ? ? ? ? ? Cardiovascular ?Exercise Tolerance: Good ?hypertension, Pt. on medications ?+ CAD and + CABG  ?Normal cardiovascular exam ?Rhythm:Regular Rate:Normal ? ? ?'18 Cath - ?Codominant coronary anatomy. ??Normal left main. ??20-25% ostial/proximal left anterior descending.  LAD is large and wraps the left ventricular apex.  No significant obstruction is seen. ??Large circumflex without evidence of significant obstructive disease. ??Codominant right coronary with no evidence of obstruction. ??Normal left ventricular function with EF 55%.  LVEDP 12 mmHg ??Occluded proximal LIMA to LAD identified by retrograde flow of LIMA from LAD to proximal segment where total occlusion exists. ??Angiography is unchanged from study done in 2015 ? ?  ?Neuro/Psych ?negative neurological ROS ? negative psych ROS  ? GI/Hepatic ?negative GI ROS, Neg liver ROS,   ?Endo/Other  ?negative endocrine ROS ? Renal/GU ?negative Renal ROS  ? ?  ?Musculoskeletal ? ?(+) Arthritis , Osteoarthritis,   ? Abdominal ?  ?Peds ? Hematology ? ?(+) Blood dyscrasia, anemia ,   ?Anesthesia Other Findings ?Breast Cancer ? Reproductive/Obstetrics ? ?  ? ? ? ? ? ? ? ? ? ? ? ? ? ?  ?  ? ? ? ? ? ? ? ? ?Anesthesia Physical ? ?Anesthesia Plan ? ?ASA: III ? ?Anesthesia Plan: General  ? ?Post-op Pain Management: Regional block* and Minimal or no pain anticipated  ? ?Induction: Intravenous ? ?PONV Risk Score and Plan: 3  and Treatment may vary due to age or medical condition, Ondansetron, Dexamethasone and Midazolam ? ?Airway Management Planned: LMA ? ?Additional Equipment: None ? ?Intra-op Plan:  ? ?Post-operative Plan: Extubation in OR ? ?Informed Consent: I have reviewed the patients History and Physical, chart, labs and discussed the procedure including the risks, benefits and alternatives for the proposed anesthesia with the patient or authorized representative who has indicated his/her understanding and acceptance.  ? ? ? ? ? ?Plan Discussed with: CRNA and Anesthesiologist ? ?Anesthesia Plan Comments:   ? ? ? ? ? ? ?Anesthesia Quick Evaluation ? ?

## 2021-06-06 NOTE — Discharge Instructions (Signed)
May take NSAIDS (ibuprofen, Motrin) after 4:10pm if needed.  ? ? ?Post Anesthesia Home Care Instructions ? ?Activity: ?Get plenty of rest for the remainder of the day. A responsible individual must stay with you for 24 hours following the procedure.  ?For the next 24 hours, DO NOT: ?-Drive a car ?-Paediatric nurse ?-Drink alcoholic beverages ?-Take any medication unless instructed by your physician ?-Make any legal decisions or sign important papers. ? ?Meals: ?Start with liquid foods such as gelatin or soup. Progress to regular foods as tolerated. Avoid greasy, spicy, heavy foods. If nausea and/or vomiting occur, drink only clear liquids until the nausea and/or vomiting subsides. Call your physician if vomiting continues. ? ?Special Instructions/Symptoms: ?Your throat may feel dry or sore from the anesthesia or the breathing tube placed in your throat during surgery. If this causes discomfort, gargle with warm salt water. The discomfort should disappear within 24 hours. ? ?If you had a scopolamine patch placed behind your ear for the management of post- operative nausea and/or vomiting: ? ?1. The medication in the patch is effective for 72 hours, after which it should be removed.  Wrap patch in a tissue and discard in the trash. Wash hands thoroughly with soap and water. ?2. You may remove the patch earlier than 72 hours if you experience unpleasant side effects which may include dry mouth, dizziness or visual disturbances. ?3. Avoid touching the patch. Wash your hands with soap and water after contact with the patch. ?    ?

## 2021-06-06 NOTE — Transfer of Care (Signed)
Immediate Anesthesia Transfer of Care Note ? ?Patient: Casey Huerta ? ?Procedure(s) Performed: RIGHT BREAST LUMPECTOMY WITH RADIOACTIVE SEED AND SENTINEL LYMPH NODE BIOPSY (Right: Breast) ? ?Patient Location: PACU ? ?Anesthesia Type:GA combined with regional for post-op pain ? ?Level of Consciousness: sedated ? ?Airway & Oxygen Therapy: Patient Spontanous Breathing and Patient connected to face mask oxygen ? ?Post-op Assessment: Report given to RN and Post -op Vital signs reviewed and stable ? ?Post vital signs: Reviewed and stable ? ?Last Vitals:  ?Vitals Value Taken Time  ?BP    ?Temp    ?Pulse    ?Resp    ?SpO2    ? ? ?Last Pain:  ?Vitals:  ? 06/06/21 1000  ?TempSrc: Oral  ?PainSc: 0-No pain  ?   ? ?Patients Stated Pain Goal: 3 (06/06/21 1000) ? ?Complications: No notable events documented. ?

## 2021-06-07 ENCOUNTER — Encounter (HOSPITAL_BASED_OUTPATIENT_CLINIC_OR_DEPARTMENT_OTHER): Payer: Self-pay | Admitting: General Surgery

## 2021-06-07 ENCOUNTER — Encounter (HOSPITAL_COMMUNITY): Payer: Self-pay

## 2021-06-08 LAB — SURGICAL PATHOLOGY

## 2021-06-12 ENCOUNTER — Encounter: Payer: Self-pay | Admitting: *Deleted

## 2021-06-12 ENCOUNTER — Telehealth: Payer: Self-pay | Admitting: *Deleted

## 2021-06-12 NOTE — Telephone Encounter (Signed)
Received order for oncotype testing. Requisition faxed to pathology and [facility] °

## 2021-06-13 ENCOUNTER — Encounter: Payer: Self-pay | Admitting: Hematology and Oncology

## 2021-06-13 NOTE — Progress Notes (Unsigned)
Request for office notes received from Oncotype, faxed records to [phone], confirmation received  ?

## 2021-06-15 ENCOUNTER — Inpatient Hospital Stay (HOSPITAL_BASED_OUTPATIENT_CLINIC_OR_DEPARTMENT_OTHER): Payer: Commercial Managed Care - HMO | Admitting: Hematology and Oncology

## 2021-06-15 ENCOUNTER — Other Ambulatory Visit: Payer: Self-pay

## 2021-06-15 DIAGNOSIS — Z7982 Long term (current) use of aspirin: Secondary | ICD-10-CM | POA: Diagnosis not present

## 2021-06-15 DIAGNOSIS — C50411 Malignant neoplasm of upper-outer quadrant of right female breast: Secondary | ICD-10-CM | POA: Diagnosis not present

## 2021-06-15 DIAGNOSIS — Z170 Estrogen receptor positive status [ER+]: Secondary | ICD-10-CM | POA: Diagnosis not present

## 2021-06-15 DIAGNOSIS — Z79899 Other long term (current) drug therapy: Secondary | ICD-10-CM | POA: Diagnosis not present

## 2021-06-15 NOTE — Assessment & Plan Note (Signed)
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 ?Pathology counseling: I discussed the final pathology report of the patient provided  a copy of this report. I discussed the margins as well as lymph node surgeries. We also discussed the final staging along with previously performed ER/PR and HER-2/neu testing. ? ?Treatment plan: ?1. Oncotype DX testing to determine if chemotherapy (if the final tumor is greater than 1 cm) ?2. Adjuvant radiation therapy followed by ?3. Adjuvant antiestrogen therapy ? ?Return to clinic based on Oncotype DX test result ? ?

## 2021-06-20 ENCOUNTER — Encounter: Payer: Self-pay | Admitting: Family Medicine

## 2021-06-22 ENCOUNTER — Encounter (HOSPITAL_COMMUNITY): Payer: Self-pay

## 2021-06-25 ENCOUNTER — Telehealth: Payer: Self-pay | Admitting: *Deleted

## 2021-06-25 ENCOUNTER — Encounter: Payer: Self-pay | Admitting: *Deleted

## 2021-06-25 DIAGNOSIS — C50411 Malignant neoplasm of upper-outer quadrant of right female breast: Secondary | ICD-10-CM

## 2021-06-25 NOTE — Telephone Encounter (Signed)
Received oncotype results of 9/3%. Patient is aware. Referral placed for Dr. Moody. 

## 2021-06-26 NOTE — Therapy (Signed)
?OUTPATIENT PHYSICAL THERAPY BREAST CANCER POST OP FOLLOW UP ? ? ?Patient Name: Casey Huerta ?MRN: [id number] ?DOB:07-23-1955, 66 y.o., female ?Today's Date: 06/27/2021 ? ? PT End of Session - 06/27/21 1303   ? ? Visit Number 2   ? Number of Visits 2   ? Date for PT Re-Evaluation 06/27/21   ? PT Start Time 1303   ? PT Stop Time 1348   ? PT Time Calculation (min) 45 min   ? Activity Tolerance Patient tolerated treatment well   ? Behavior During Therapy [facility] for tasks assessed/performed   ? ?  ?  ? ?  ? ? ?Past Medical History:  ?Diagnosis Date  ? Aortic insufficiency   ? a. Mild-mod AI by echo 2010.  ? Arthritis   ? Breast cancer ([place]) 04/25/2021  ? Breast mass 05/17/2016  ? BIRADS 3: biopsy revealed benign fibroadenoma  ? COPD   ? CORONARY ARTERY DISEASE   ? a. CABG (LIMA-->LAD 2001). b. Myoview 2008 no ischemia, EF 63%.  c. cath 10/14/2013 mild 20% ost LAD, atretic LIMA, EF 55%, medical therapy  ? Family history of leukemia   ? Family history of ovarian cancer   ? Family history of prostate cancer   ? Hematemesis 09/2018  ? Hyperlipidemia   ? MENORRHAGIA   ? Unspecified essential hypertension   ? ?Past Surgical History:  ?Procedure Laterality Date  ? arthroscopic knee surgery    ? BIOPSY  09/23/2018  ? Procedure: BIOPSY;  Surgeon: Juanita Craver, MD;  Location: [facility] ENDOSCOPY;  Service: Endoscopy;;  ? BREAST BIOPSY Right 05/28/2016  ? HYALINIZED FIBROADENOMA  ? BREAST LUMPECTOMY WITH RADIOACTIVE SEED AND SENTINEL LYMPH NODE BIOPSY Right 06/06/2021  ? Procedure: RIGHT BREAST LUMPECTOMY WITH RADIOACTIVE SEED AND SENTINEL LYMPH NODE BIOPSY;  Surgeon: Jovita Kussmaul, MD;  Location: [place];  Service: General;  Laterality: Right;  ? CESAREAN SECTION    ? CORONARY ARTERY BYPASS GRAFT  2001  ? ESOPHAGOGASTRODUODENOSCOPY (EGD) WITH PROPOFOL Left 09/23/2018  ? Procedure: ESOPHAGOGASTRODUODENOSCOPY (EGD) WITH PROPOFOL;  Surgeon: Juanita Craver, MD;  Location: [facility] ENDOSCOPY;  Service: Endoscopy;  Laterality: Left;   ? LEFT HEART CATH AND CORONARY ANGIOGRAPHY N/A 01/17/2017  ? Procedure: LEFT HEART CATH AND CORONARY ANGIOGRAPHY;  Surgeon: Belva Crome, MD;  Location: [facility] CV LAB;  Service: Cardiovascular;  Laterality: N/A;  ? LEFT HEART CATHETERIZATION WITH CORONARY/GRAFT ANGIOGRAM N/A 10/12/2013  ? Procedure: LEFT HEART CATHETERIZATION WITH Beatrix Fetters;  Surgeon: Troy Sine, MD;  Location: [facility] CATH LAB;  Service: Cardiovascular;  Laterality: N/A;  ? ?Patient Active Problem List  ? Diagnosis Date Noted  ? Family history of prostate cancer 05/23/2021  ? Family history of ovarian cancer 05/23/2021  ? Family history of leukemia 05/23/2021  ? Malignant neoplasm of upper-outer quadrant of right breast in female, estrogen receptor positive ([place]) 05/03/2021  ? Prediabetes 02/28/2020  ? Osteoarthritis of both knees 06/14/2016  ? Breast mass 05/17/2016  ? Aortic insufficiency 03/06/2012  ? Dyslipidemia 06/19/2007  ? Essential hypertension 06/19/2007  ? Hx of CABG 06/19/2007  ? ? ?PCP: Lamar Blinks, MD ? ?REFERRING PROVIDER: Jovita Kussmaul, MD ? ?REFERRING DIAG: Right Breast Cancer ? ?THERAPY DIAG:  ?Malignant neoplasm of upper-outer quadrant of right female breast, unspecified estrogen receptor status ([place]) ? ?Abnormal posture ? ?ONSET DATE: 04/25/2021 ? ?SUBJECTIVE:                                                                                                                                                                                          ? ?  SUBJECTIVE STATEMENT: ?Things have been going well since surgery. I have been doing my exercises and I think I can do pretty well.. I have a call on May 17th to find out about radiation. My family won't let me do anything. ? ?PERTINENT HISTORY:  ?  Screening mammogram detected right breast mass, by ultrasound measured 9 mm, biopsy revealed grade 1 IDC with low-grade DCIS, ER 95%, PR 90%, Ki-67 10%, HER2 2+ by IHC, FISH negative  ?Pt underwent Right lumpectomy with  deep right axillary SLNB on 06/06/2021 with 0/4 LNS.  Tumor was ER+, PR+, HER 2 - with Ki 67 10%. Tumor was 1.2 cm and gr 2. She is pending radiation and anti-estrogen ? ?PATIENT GOALS:  Reassess how my recovery is going related to arm function, pain, and swelling. ? ?PAIN:  ?Are you having pain? No ? ?PRECAUTIONS: Recent Surgery, right UE Lymphedema risk, None and Other: OA ? ?ACTIVITY LEVEL / LEISURE: nothing in specific, discussed importance of walking for leisure activities 30 min a day to decrease risk for recurrence ? ? ?OBJECTIVE:  ? ?PATIENT SURVEYS:  ?QUICK DASH: ?34% ? ?OBSERVATIONS: ? Right breast with mild generalized swelling and with cord running from lateral to medial above areola. Small scabs still noted at incision, otherwise well healed ? ?POSTURE:  ?Forward head, rounded shoulders ? ?LYMPHEDEMA ASSESSMENT:  ? ?UPPER EXTREMITY AROM/PROM: ? ?A/PROM RIGHT  05/18/2021 ?  06/27/2021  ?Shoulder extension 69 71  ?Shoulder flexion 142 145  ?Shoulder abduction 145 160  ?Shoulder internal rotation 55 54  ?Shoulder external rotation 108 101  ?                        (Blank rows = not tested) ?  ?A/PROM LEFT  05/18/2021  ?Shoulder extension 64  ?Shoulder flexion 147  ?Shoulder abduction 143  ?Shoulder internal rotation 68  ?Shoulder external rotation 110  ?                        (Blank rows = not tested) ?  ?  ?CERVICAL AROM: ?All within normal limits:  ?  ?  ?UPPER EXTREMITY STRENGTH: WNL ?  ?  ?LYMPHEDEMA ASSESSMENTS:  ?  ?LANDMARK RIGHT  05/18/2021 06/27/2021  ?10 cm proximal to olecranon process 26.8 26.9  ?Olecranon process 23.5 23.7  ?10 cm proximal to ulnar styloid process 20.6 20.6  ?Just proximal to ulnar styloid process 15.6 15.9  ?Across hand at thumb web space 19.5 19.5  ?At base of 2nd digit 6.65 6.5  ?(Blank rows = not tested) ?  ?[place] LEFT  05/18/2021  ?10 cm proximal to olecranon process 27.5  ?Olecranon process 23.6  ?10 cm proximal to ulnar styloid process 20.1  ?Just proximal to ulnar styloid  process 15.2  ?Across hand at thumb web space 19.0  ?At base of 2nd digit 6.2  ?(Blank rows = not tested) ?  ? ?  ?Surgery type/Date: 06/06/2021, Right lumpectomy with deep axillary SLNB ?Number of lymph nodes removed: 0/4 ?Current/past treatment (chemo, radiation, hormone therapy): pending radiation, antiestrogens ?Other symptoms:  ?Heaviness/tightness No ?Pain No ?Pitting edema No ?Infections No ?Decreased scar mobility No ?Stemmer sign No ? ? ?PATIENT EDUCATION:  ?Education details: Discussed slowly returning to activities, Pt educated in scar massage, ABC class and SOZO and set up for those dates. Advised to continue to wear compression bra and to do exercises during and slightly after radiation. Also advised to watch for  increased swelling/tightness during radiation. Given information on cording ?Person educated: Patient ?Education method: Explanation ?Education comprehension: verbalized understanding ? ? ?HOME EXERCISE PROGRAM: ? Reviewed previously given post op HEP. ? ? ?ASSESSMENT: ? ?CLINICAL IMPRESSION: ?Pt is doing very well s/p her right  lumpectomy with SLNB on 06/06/2021.  Shoulder ROM is progressing well and she has no real complaints of pain. There is no significant difference in circumference measures of the arms. She does have some mild breast edema with a cord in the right breast running from lateral to medial and above nipple. She has no pain or tenderness in the right breast.  She was set up for the ABC class and her next SOZO screen. She will continue with her exercises and wearing her compression bra. She will start scar massage when the scabs have fallen off. She was advised to call if she has any questions or concerns. She is released to independent home management. ? ?Pt will benefit from skilled therapeutic intervention to improve on the following deficits: Decreased knowledge of precautions, impaired UE functional use, pain, decreased ROM, postural dysfunction.  ? ?PT  treatment/interventions: ADL/Self care home management, Therapeutic exercises, Patient/Family education, and scar mobilization ? ? ? ? ?GOALS: ?Goals reviewed with patient? Yes ? ?LONG TERM GOALS:  (STG=LTG) ? ?GOALS Na

## 2021-06-27 ENCOUNTER — Encounter: Payer: Self-pay | Admitting: Family Medicine

## 2021-06-27 ENCOUNTER — Ambulatory Visit: Payer: Commercial Managed Care - HMO | Attending: General Surgery

## 2021-06-27 DIAGNOSIS — M25562 Pain in left knee: Secondary | ICD-10-CM

## 2021-06-27 DIAGNOSIS — G8929 Other chronic pain: Secondary | ICD-10-CM

## 2021-06-27 DIAGNOSIS — C50411 Malignant neoplasm of upper-outer quadrant of right female breast: Secondary | ICD-10-CM | POA: Insufficient documentation

## 2021-06-27 DIAGNOSIS — R293 Abnormal posture: Secondary | ICD-10-CM | POA: Diagnosis present

## 2021-06-27 DIAGNOSIS — M25412 Effusion, left shoulder: Secondary | ICD-10-CM

## 2021-06-27 MED ORDER — HYDROCODONE-ACETAMINOPHEN 5-325 MG PO TABS: 1.0000 | ORAL_TABLET | Freq: Two times a day (BID) | ORAL | 0 refills | Status: DC | PRN

## 2021-06-27 NOTE — Patient Instructions (Signed)
[facility]  [address], [address]  [place] [address]  [phone] Axillary web syndrome (also called cording) can happen after having breast cancer surgery when lymph nodes in the armpit are removed. It presents as if you have a thin cord in your arm and can run from the armpit all the way down into the forearm. If you've had a sentinel node biopsy, the risk is 1-20% and if you've had an axillary lymph node dissection (more than 7 nodes removed), the risk is 36-72%. The ranges vary depending on the research study.  It most often happens 3-4 weeks post-op but can happen sooner or later. There are several possibilities for what cording actually is. Although no one knows for sure as of yet, it may be related to lymphatics, veins, or other tissue. Sometimes cording resolves on its own but other times it requires physical therapy with a therapist who specializes in lymphedema and/or cancer rehab. Treatment typically involves stretching, manual techniques, and exercise. Sometimes cords get "released" while stretching or during manual treatment and the patient may experience the sensation of a "pop." This may feel strange but it is not dangerous and is a sign that the cord has released; range of motion may be improved in the process.  After Breast Cancer Class It is recommended you attend the ABC class to be educated on lymphedema risk reduction. This class is free of charge and lasts for 1 hour. It is a 1-time class. You will need to download the Webex app either on your phone or computer. We will send you a link the night before or the morning of the class. You should be able to click on that link to join the class. This is not a confidential class. You don't have to turn your camera on, but other participants may be able to see your email address.  Scar massage You can begin gentle scar massage to you incision sites. Gently place one hand on the incision and move the skin  (without sliding on the skin) in various directions. Do this for a few minutes and then you can gently massage either coconut oil or vitamin E cream into the scars.  Compression garment You should continue wearing your compression bra until you feel like you no longer have swelling.  Home exercise Program Continue doing the exercises you were given until you feel like you can do them without feeling any tightness at the end.   Walking Program Studies show that 30 minutes of walking per day (fast enough to elevate your heart rate) can significantly reduce the risk of a cancer recurrence. If you can't walk due to other medical reasons, we encourage you to find another activity you could do (like a stationary bike or water exercise).  Posture After breast cancer surgery, people frequently sit with rounded shoulders posture because it puts their incisions on slack and feels better. If you sit like this and scar tissue forms in that position, you can become very tight and have pain sitting or standing with good posture. Try to be aware of your posture and sit and stand up tall to heal properly.  Follow up PT: It is recommended you return every 3 months for the first 2 years following surgery to be assessed on the SOZO machine for an L-Dex score. This helps prevent clinically significant lymphedema in 95% of patients. These follow up screens are 10 minute appointments that you are not billed for.  

## 2021-07-02 ENCOUNTER — Encounter: Payer: Self-pay | Admitting: *Deleted

## 2021-07-03 ENCOUNTER — Encounter: Payer: Self-pay | Admitting: Radiation Oncology

## 2021-07-03 NOTE — Progress Notes (Addendum)
Follow-up-new appointment. I verified patient identity and began nursing interview. Patient is doing well. No issues reported at this time. ? ?Meaningful use complete. ?Postmenopausal- NO chances of pregnancy. ? ? ?BP (!) 155/63 (BP Location: Left Arm, Patient Position: Sitting, Cuff Size: Normal)   Pulse 67   Temp 97.8 ?F (36.6 ?C)   Resp 20   Ht '5\' 2"'$  (1.575 m)   Wt 132 lb 9.6 oz (60.1 kg)   SpO2 99%   BMI 24.25 kg/m?  ? ?

## 2021-07-04 ENCOUNTER — Ambulatory Visit
Admission: RE | Admit: 2021-07-04 | Discharge: 2021-07-04 | Disposition: A | Discharge status: Home / Self Care | Payer: Commercial Managed Care - HMO | Source: Ambulatory Visit | Attending: Radiation Oncology | Admitting: Radiation Oncology

## 2021-07-04 ENCOUNTER — Other Ambulatory Visit: Payer: Self-pay

## 2021-07-04 ENCOUNTER — Ambulatory Visit: Payer: Commercial Managed Care - HMO | Admitting: Radiation Oncology

## 2021-07-04 VITALS — BP 155/63 | HR 67 | Temp 97.8°F | Resp 20 | Ht 62.0 in | Wt 132.6 lb

## 2021-07-04 DIAGNOSIS — Z8041 Family history of malignant neoplasm of ovary: Secondary | ICD-10-CM | POA: Insufficient documentation

## 2021-07-04 DIAGNOSIS — Z7982 Long term (current) use of aspirin: Secondary | ICD-10-CM | POA: Insufficient documentation

## 2021-07-04 DIAGNOSIS — Z79899 Other long term (current) drug therapy: Secondary | ICD-10-CM | POA: Insufficient documentation

## 2021-07-04 DIAGNOSIS — J449 Chronic obstructive pulmonary disease, unspecified: Secondary | ICD-10-CM | POA: Insufficient documentation

## 2021-07-04 DIAGNOSIS — Z170 Estrogen receptor positive status [ER+]: Secondary | ICD-10-CM | POA: Diagnosis not present

## 2021-07-04 DIAGNOSIS — E785 Hyperlipidemia, unspecified: Secondary | ICD-10-CM | POA: Insufficient documentation

## 2021-07-04 DIAGNOSIS — I10 Essential (primary) hypertension: Secondary | ICD-10-CM | POA: Diagnosis not present

## 2021-07-04 DIAGNOSIS — Z87891 Personal history of nicotine dependence: Secondary | ICD-10-CM | POA: Insufficient documentation

## 2021-07-04 DIAGNOSIS — M129 Arthropathy, unspecified: Secondary | ICD-10-CM | POA: Insufficient documentation

## 2021-07-04 DIAGNOSIS — Z8042 Family history of malignant neoplasm of prostate: Secondary | ICD-10-CM | POA: Insufficient documentation

## 2021-07-04 DIAGNOSIS — Z510 Encounter for antineoplastic radiation therapy: Secondary | ICD-10-CM | POA: Insufficient documentation

## 2021-07-04 DIAGNOSIS — Z801 Family history of malignant neoplasm of trachea, bronchus and lung: Secondary | ICD-10-CM | POA: Diagnosis not present

## 2021-07-04 DIAGNOSIS — I351 Nonrheumatic aortic (valve) insufficiency: Secondary | ICD-10-CM | POA: Insufficient documentation

## 2021-07-04 DIAGNOSIS — I2510 Atherosclerotic heart disease of native coronary artery without angina pectoris: Secondary | ICD-10-CM | POA: Insufficient documentation

## 2021-07-04 DIAGNOSIS — Z806 Family history of leukemia: Secondary | ICD-10-CM | POA: Insufficient documentation

## 2021-07-04 DIAGNOSIS — C50411 Malignant neoplasm of upper-outer quadrant of right female breast: Secondary | ICD-10-CM | POA: Diagnosis present

## 2021-07-04 NOTE — Progress Notes (Signed)
?Radiation Oncology         [phone] ?________________________________ ? ?Name: Casey Huerta        MRN: [id number]  ?Date of Service: 07/04/2021 DOB: [birth year] ? ?SB:[id number], Gwenlyn Found, MD  Serena Croissant, MD    ? ?REFERRING PHYSICIAN: Serena Croissant, MD ? ? ?DIAGNOSIS: The encounter diagnosis was Malignant neoplasm of upper-outer quadrant of right breast in female, estrogen receptor positive ([facility]). ? ? ?HISTORY OF PRESENT ILLNESS: Casey Huerta is a 66 y.o. female with a diagnosis of right breast cancer.  The patient was found to have a mass in the right breast on screening mammogram.  In the superior breast in the 10 o'clock position by diagnostic ultrasound this measured 9 mm, and her right axilla was negative for adenopathy.   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   ? ?Since her last visit, the patient has undergone a right lumpectomy with sentinel lymph node biopsy on 06/06/2021 with Dr. Carolynne Edouard.  Final pathology showed a grade 2 invasive ductal carcinoma measuring up to 1.2 cm in greatest dimension.  Her margins were free of disease and 3 lymph nodes were submitted in her specimen.  All were negative for disease.  Oncotype DX score was performed formed and showed a score of 9 so no systemic chemotherapy is necessary.  She is seen to discuss adjuvant radiotherapy.   ? ?PREVIOUS RADIATION THERAPY: No ? ? ?PAST MEDICAL HISTORY:  ?Past Medical History:  ?Diagnosis Date  ? Aortic insufficiency   ? a. Mild-mod AI by echo 2010.  ? Arthritis   ? Breast cancer ([facility]) 04/25/2021  ? Breast mass 05/17/2016  ? BIRADS 3: biopsy revealed benign fibroadenoma  ? COPD   ? CORONARY ARTERY DISEASE   ? a. CABG (LIMA-->LAD 2001). b. Myoview 2008 no ischemia, EF 63%.  c. cath 10/14/2013 mild 20% ost LAD, atretic LIMA, EF 55%, medical therapy  ? Family history of leukemia   ?  Family history of ovarian cancer   ? Family history of prostate cancer   ? Hematemesis 09/2018  ? Hyperlipidemia   ? MENORRHAGIA   ? Unspecified essential hypertension   ?   ? ? ?PAST SURGICAL HISTORY: ?Past Surgical History:  ?Procedure Laterality Date  ? arthroscopic knee surgery    ? BIOPSY  09/23/2018  ? Procedure: BIOPSY;  Surgeon: Charna Elizabeth, MD;  Location: [facility] ENDOSCOPY;  Service: Endoscopy;;  ? BREAST BIOPSY Right 05/28/2016  ? HYALINIZED FIBROADENOMA  ? BREAST LUMPECTOMY WITH RADIOACTIVE SEED AND SENTINEL LYMPH NODE BIOPSY Right 06/06/2021  ? Procedure: RIGHT BREAST LUMPECTOMY WITH RADIOACTIVE SEED AND SENTINEL LYMPH NODE BIOPSY;  Surgeon: Griselda Miner, MD;  Location: [facility];  Service: General;  Laterality: Right;  ? CESAREAN SECTION    ? CORONARY ARTERY BYPASS GRAFT  2001  ? ESOPHAGOGASTRODUODENOSCOPY (EGD) WITH PROPOFOL Left 09/23/2018  ? Procedure: ESOPHAGOGASTRODUODENOSCOPY (EGD) WITH PROPOFOL;  Surgeon: Charna Elizabeth, MD;  Location: [facility] ENDOSCOPY;  Service: Endoscopy;  Laterality: Left;  ? LEFT HEART CATH AND CORONARY ANGIOGRAPHY N/A 01/17/2017  ? Procedure: LEFT HEART CATH AND CORONARY ANGIOGRAPHY;  Surgeon: Lyn Records, MD;  Location: [facility] INVASIVE CV LAB;  Service: Cardiovascular;  Laterality: N/A;  ? LEFT HEART CATHETERIZATION WITH CORONARY/GRAFT ANGIOGRAM N/A 10/12/2013  ? Procedure: LEFT HEART CATHETERIZATION WITH Isabel Caprice;  Surgeon: Lennette Bihari, MD;  Location: [facility] CATH LAB;  Service: Cardiovascular;  Laterality: N/A;  ? ? ? ?FAMILY HISTORY:  ?Family History  ?Problem Relation Age of Onset  ? Heart disease Mother   ? Cancer Mother   ?     possibly ovarian cancer  ? Lung cancer Father   ? Heart disease Sister   ? Ovarian cancer Sister   ? Cancer - Prostate Brother 42  ? Leukemia Niece   ?     great niece died of leukemia at 45  ? ? ? ?SOCIAL HISTORY:  reports that she quit smoking about 43 years ago. Her smoking use included cigarettes. She has never used  smokeless tobacco. She reports current alcohol use. She reports that she does not use drugs.  The patient is married and lives in [place].  She is retired from [employer]. ? ? ?ALLERGIES: Simvastatin ? ? ?MEDICATIONS:  ?Current Outpatient Medications  ?Medication Sig Dispense Refill  ? ALPRAZolam (XANAX) 0.25 MG tablet TAKE 1 TABLET BY MOUTH ONCE DAILY AS NEEDED FOR ANXIETY 30 tablet 3  ? amLODipine (NORVASC) 5 MG tablet Take 1 tablet (5 mg total) by mouth daily. 90 tablet 3  ? aspirin 81 MG chewable tablet Chew 1 tablet (81 mg total) by mouth daily. 90 tablet 4  ? atenolol (TENORMIN) 12.5 mg TABS tablet Atenolol    ? celecoxib (CELEBREX) 100 MG capsule TAKE 1 CAPSULE BY MOUTH TWICE DAILY AS NEEDED FOR JOINT PAIN 180 capsule 3  ? HYDROcodone-acetaminophen (NORCO/VICODIN) 5-325 MG tablet Take 1 tablet by mouth every 12 (twelve) hours as needed for up to 30 doses for severe pain or moderate pain. 30 tablet 0  ? Multiple Vitamins-Minerals (MULTIVITAMIN WITH MINERALS) tablet Take 1 tablet by mouth daily.    ? nitroGLYCERIN (NITROSTAT) 0.4 MG SL tablet Place 1 tablet (0.4 mg total) under the tongue every 5 (five) minutes x 3 doses as needed for chest pain. 25 tablet 1  ? ondansetron (ZOFRAN-ODT) 4 MG disintegrating tablet Take 4 mg by mouth every 8 (eight) hours as needed.    ? oxyCODONE (ROXICODONE) 5 MG immediate release tablet Take 1 tablet (5 mg total) by mouth every 6 (six) hours as needed for severe pain. 10 tablet 0  ? rosuvastatin (CRESTOR) 20 MG tablet Take 1 tablet (20 mg total) by mouth daily. (Patient not taking: Reported on 07/03/2021) 90 tablet 3  ? traMADol (ULTRAM) 50 MG tablet TAKE 1 TABLET BY MOUTH EVERY DAY AT BEDTIME DO  NOT  MIX  WITH  XANAX 30 tablet 3  ? ?No current facility-administered medications for this encounter.  ? ? ? ?REVIEW OF SYSTEMS: On review of systems, the patient reports that she is doing well overall. She is anxious to have surgery but otherwise is not having any breast  specific complaints.  ? ?  ? ?PHYSICAL EXAM:  ?Wt Readings from Last 3 Encounters:  ?06/15/21 132 lb 6.4 oz (60.1 kg)  ?06/06/21 129 lb 13.6 oz (58.9 kg)  ?05/30/21 131 lb (59.4 kg)  ? ?Temp Readings from Last 3 Encounters:  ?06/15/21 97.7 ?F (36.5 ?C) (Temporal)  ?06/06/21 97.6 ?F (36.4 ?C)  ?05/17/21 (!) 97.5 ?F (36.4 ?C) (Temporal)  ? ?BP Readings from Last 3 Encounters:  ?06/15/21 (!) 158/93  ?06/06/21 (!) 148/59  ?05/30/21 128/60  ? ?Pulse Readings from Last 3 Encounters:  ?06/15/21 69  ?06/06/21 75  ?05/30/21 80  ? ? ?In general this is a well appearing African-American female in no acute distress. She's alert  and oriented x4 and appropriate throughout the examination. Cardiopulmonary assessment is negative for acute distress and she exhibits normal effort.  Her right breast reveals a well-healed incision without separation bleeding or drainage. ? ? ?ECOG = 0 ? ?0 - Asymptomatic (Fully active, able to carry on all predisease activities without restriction) ? ?1 - Symptomatic but completely ambulatory (Restricted in physically strenuous activity but ambulatory and able to carry out work of a light or sedentary nature. For example, light housework, office work) ? ?2 - Symptomatic, <50% in bed during the day (Ambulatory and capable of all self care but unable to carry out any work activities. Up and about more than 50% of waking hours) ? ?3 - Symptomatic, >50% in bed, but not bedbound (Capable of only limited self-care, confined to bed or chair 50% or more of waking hours) ? ?4 - Bedbound (Completely disabled. Cannot carry on any self-care. Totally confined to bed or chair) ? ?5 - Death ? ? Oken MM, Creech RH, Tormey DC, et al. [phone]). "Toxicity and response criteria of the [facility]". [place] Oncol. 5 (6): 649-55 ? ? ? ?LABORATORY DATA:  ?Lab Results  ?Component Value Date  ? WBC 5.6 03/05/2021  ? HGB 13.3 03/05/2021  ? HCT 41.3 03/05/2021  ? MCV 90.7 03/05/2021  ? PLT 385.0  03/05/2021  ? ?Lab Results  ?Component Value Date  ? NA 139 05/29/2021  ? K 4.5 05/29/2021  ? CL 106 05/29/2021  ? CO2 27 05/29/2021  ? ?Lab Results  ?Component Value Date  ? ALT 15 03/05/2021  ? AST 18 03/05/2021  ? ALK

## 2021-07-10 ENCOUNTER — Encounter: Payer: Self-pay | Admitting: *Deleted

## 2021-07-10 DIAGNOSIS — Z510 Encounter for antineoplastic radiation therapy: Secondary | ICD-10-CM | POA: Diagnosis not present

## 2021-07-11 ENCOUNTER — Telehealth: Payer: Self-pay | Admitting: Hematology and Oncology

## 2021-07-11 NOTE — Telephone Encounter (Signed)
.  Called patient to schedule appointment per 5/23 inbasket, patient is aware of date and time.   

## 2021-07-12 ENCOUNTER — Other Ambulatory Visit: Payer: Self-pay

## 2021-07-12 ENCOUNTER — Ambulatory Visit
Admission: RE | Admit: 2021-07-12 | Discharge: 2021-07-12 | Disposition: A | Discharge status: Home / Self Care | Payer: Commercial Managed Care - HMO | Source: Ambulatory Visit | Attending: Radiation Oncology | Admitting: Radiation Oncology

## 2021-07-12 DIAGNOSIS — Z510 Encounter for antineoplastic radiation therapy: Secondary | ICD-10-CM | POA: Diagnosis not present

## 2021-07-12 DIAGNOSIS — C50411 Malignant neoplasm of upper-outer quadrant of right female breast: Secondary | ICD-10-CM

## 2021-07-12 LAB — RAD ONC ARIA SESSION SUMMARY
Course Elapsed Days: 0
Plan Fractions Treated to Date: 1
Plan Prescribed Dose Per Fraction: 2.66 Gy
Plan Total Fractions Prescribed: 16
Plan Total Prescribed Dose: 42.56 Gy
Reference Point Dosage Given to Date: 2.66 Gy
Reference Point Session Dosage Given: 2.66 Gy
Session Number: 1

## 2021-07-12 MED ORDER — SONAFINE EX EMUL: 1.0000 "application " | Freq: Once | CUTANEOUS | Status: DC

## 2021-07-12 MED ORDER — ALRA NON-METALLIC DEODORANT (RAD-ONC)
1.0000 "application " | Freq: Once | TOPICAL | Status: AC
  Administered 2021-07-12: 1 via TOPICAL

## 2021-07-12 MED ORDER — RADIAPLEXRX EX GEL: Freq: Once | CUTANEOUS | Status: AC

## 2021-07-12 NOTE — Progress Notes (Signed)
Pt here for patient teaching.  Pt given Radiation and You booklet, skin care instructions, alra deodorant and Radiaplex.    Reviewed areas of pertinence such as fatigue, hair loss, skin changes, breast tenderness, and breast swelling. Pt able to give teach back of to pat skin and use unscented/gentle soap,apply Radiaplex bid, avoid applying anything to skin within 4 hours of treatment, avoid wearing an under wire bra, and to use an electric razor if they must shave. Pt verbalizes understanding of information given and will contact nursing with any questions or concerns.    Ibtisam Benge M. Jeslie Lowe RN, BSN  

## 2021-07-13 ENCOUNTER — Other Ambulatory Visit: Payer: Self-pay

## 2021-07-13 ENCOUNTER — Ambulatory Visit
Admission: RE | Admit: 2021-07-13 | Discharge: 2021-07-13 | Disposition: A | Discharge status: Home / Self Care | Payer: Commercial Managed Care - HMO | Source: Ambulatory Visit | Attending: Radiation Oncology | Admitting: Radiation Oncology

## 2021-07-13 DIAGNOSIS — Z510 Encounter for antineoplastic radiation therapy: Secondary | ICD-10-CM | POA: Diagnosis not present

## 2021-07-13 LAB — RAD ONC ARIA SESSION SUMMARY
Course Elapsed Days: 1
Plan Fractions Treated to Date: 2
Plan Prescribed Dose Per Fraction: 2.66 Gy
Plan Total Fractions Prescribed: 16
Plan Total Prescribed Dose: 42.56 Gy
Reference Point Dosage Given to Date: 5.32 Gy
Reference Point Session Dosage Given: 2.66 Gy
Session Number: 2

## 2021-07-17 ENCOUNTER — Other Ambulatory Visit: Payer: Self-pay

## 2021-07-17 ENCOUNTER — Ambulatory Visit
Admission: RE | Admit: 2021-07-17 | Discharge: 2021-07-17 | Disposition: A | Discharge status: Home / Self Care | Payer: Commercial Managed Care - HMO | Source: Ambulatory Visit | Attending: Radiation Oncology | Admitting: Radiation Oncology

## 2021-07-17 DIAGNOSIS — Z510 Encounter for antineoplastic radiation therapy: Secondary | ICD-10-CM | POA: Diagnosis not present

## 2021-07-17 LAB — RAD ONC ARIA SESSION SUMMARY
Course Elapsed Days: 5
Plan Fractions Treated to Date: 3
Plan Prescribed Dose Per Fraction: 2.66 Gy
Plan Total Fractions Prescribed: 16
Plan Total Prescribed Dose: 42.56 Gy
Reference Point Dosage Given to Date: 7.98 Gy
Reference Point Session Dosage Given: 2.66 Gy
Session Number: 3

## 2021-07-18 ENCOUNTER — Other Ambulatory Visit: Payer: Self-pay

## 2021-07-18 ENCOUNTER — Ambulatory Visit
Admission: RE | Admit: 2021-07-18 | Discharge: 2021-07-18 | Disposition: A | Discharge status: Home / Self Care | Payer: Commercial Managed Care - HMO | Source: Ambulatory Visit | Attending: Radiation Oncology | Admitting: Radiation Oncology

## 2021-07-18 DIAGNOSIS — Z510 Encounter for antineoplastic radiation therapy: Secondary | ICD-10-CM | POA: Diagnosis not present

## 2021-07-18 LAB — RAD ONC ARIA SESSION SUMMARY
Course Elapsed Days: 6
Plan Fractions Treated to Date: 4
Plan Prescribed Dose Per Fraction: 2.66 Gy
Plan Total Fractions Prescribed: 16
Plan Total Prescribed Dose: 42.56 Gy
Reference Point Dosage Given to Date: 10.64 Gy
Reference Point Session Dosage Given: 2.66 Gy
Session Number: 4

## 2021-07-19 ENCOUNTER — Other Ambulatory Visit: Payer: Self-pay

## 2021-07-19 ENCOUNTER — Ambulatory Visit
Admission: RE | Admit: 2021-07-19 | Discharge: 2021-07-19 | Disposition: A | Discharge status: Home / Self Care | Payer: Commercial Managed Care - HMO | Source: Ambulatory Visit | Attending: Radiation Oncology | Admitting: Radiation Oncology

## 2021-07-19 DIAGNOSIS — C50411 Malignant neoplasm of upper-outer quadrant of right female breast: Secondary | ICD-10-CM | POA: Insufficient documentation

## 2021-07-19 DIAGNOSIS — Z8041 Family history of malignant neoplasm of ovary: Secondary | ICD-10-CM | POA: Insufficient documentation

## 2021-07-19 DIAGNOSIS — Z801 Family history of malignant neoplasm of trachea, bronchus and lung: Secondary | ICD-10-CM | POA: Diagnosis not present

## 2021-07-19 DIAGNOSIS — Z79899 Other long term (current) drug therapy: Secondary | ICD-10-CM | POA: Diagnosis not present

## 2021-07-19 DIAGNOSIS — Z510 Encounter for antineoplastic radiation therapy: Secondary | ICD-10-CM | POA: Insufficient documentation

## 2021-07-19 DIAGNOSIS — E785 Hyperlipidemia, unspecified: Secondary | ICD-10-CM | POA: Insufficient documentation

## 2021-07-19 DIAGNOSIS — Z170 Estrogen receptor positive status [ER+]: Secondary | ICD-10-CM | POA: Diagnosis not present

## 2021-07-19 DIAGNOSIS — Z87891 Personal history of nicotine dependence: Secondary | ICD-10-CM | POA: Diagnosis not present

## 2021-07-19 DIAGNOSIS — I10 Essential (primary) hypertension: Secondary | ICD-10-CM | POA: Insufficient documentation

## 2021-07-19 DIAGNOSIS — Z7982 Long term (current) use of aspirin: Secondary | ICD-10-CM | POA: Diagnosis not present

## 2021-07-19 DIAGNOSIS — I2510 Atherosclerotic heart disease of native coronary artery without angina pectoris: Secondary | ICD-10-CM | POA: Insufficient documentation

## 2021-07-19 DIAGNOSIS — Z806 Family history of leukemia: Secondary | ICD-10-CM | POA: Insufficient documentation

## 2021-07-19 DIAGNOSIS — J449 Chronic obstructive pulmonary disease, unspecified: Secondary | ICD-10-CM | POA: Diagnosis not present

## 2021-07-19 DIAGNOSIS — Z8042 Family history of malignant neoplasm of prostate: Secondary | ICD-10-CM | POA: Diagnosis not present

## 2021-07-19 LAB — RAD ONC ARIA SESSION SUMMARY
Course Elapsed Days: 7
Plan Fractions Treated to Date: 5
Plan Prescribed Dose Per Fraction: 2.66 Gy
Plan Total Fractions Prescribed: 16
Plan Total Prescribed Dose: 42.56 Gy
Reference Point Dosage Given to Date: 13.3 Gy
Reference Point Session Dosage Given: 2.66 Gy
Session Number: 5

## 2021-07-20 ENCOUNTER — Ambulatory Visit
Admission: RE | Admit: 2021-07-20 | Discharge: 2021-07-20 | Disposition: A | Discharge status: Home / Self Care | Payer: Commercial Managed Care - HMO | Source: Ambulatory Visit | Attending: Radiation Oncology | Admitting: Radiation Oncology

## 2021-07-20 ENCOUNTER — Other Ambulatory Visit: Payer: Self-pay

## 2021-07-20 DIAGNOSIS — Z510 Encounter for antineoplastic radiation therapy: Secondary | ICD-10-CM | POA: Diagnosis not present

## 2021-07-20 LAB — RAD ONC ARIA SESSION SUMMARY
Course Elapsed Days: 8
Plan Fractions Treated to Date: 6
Plan Prescribed Dose Per Fraction: 2.66 Gy
Plan Total Fractions Prescribed: 16
Plan Total Prescribed Dose: 42.56 Gy
Reference Point Dosage Given to Date: 15.96 Gy
Reference Point Session Dosage Given: 2.66 Gy
Session Number: 6

## 2021-07-23 ENCOUNTER — Other Ambulatory Visit: Payer: Self-pay

## 2021-07-23 ENCOUNTER — Ambulatory Visit
Admission: RE | Admit: 2021-07-23 | Discharge: 2021-07-23 | Disposition: A | Discharge status: Home / Self Care | Payer: Commercial Managed Care - HMO | Source: Ambulatory Visit | Attending: Radiation Oncology | Admitting: Radiation Oncology

## 2021-07-23 DIAGNOSIS — Z510 Encounter for antineoplastic radiation therapy: Secondary | ICD-10-CM | POA: Diagnosis not present

## 2021-07-23 LAB — RAD ONC ARIA SESSION SUMMARY
Course Elapsed Days: 11
Plan Fractions Treated to Date: 7
Plan Prescribed Dose Per Fraction: 2.66 Gy
Plan Total Fractions Prescribed: 16
Plan Total Prescribed Dose: 42.56 Gy
Reference Point Dosage Given to Date: 18.62 Gy
Reference Point Session Dosage Given: 2.66 Gy
Session Number: 7

## 2021-07-24 ENCOUNTER — Ambulatory Visit
Admission: RE | Admit: 2021-07-24 | Discharge: 2021-07-24 | Disposition: A | Discharge status: Home / Self Care | Payer: Commercial Managed Care - HMO | Source: Ambulatory Visit | Attending: Radiation Oncology | Admitting: Radiation Oncology

## 2021-07-24 ENCOUNTER — Other Ambulatory Visit: Payer: Self-pay

## 2021-07-24 DIAGNOSIS — Z510 Encounter for antineoplastic radiation therapy: Secondary | ICD-10-CM | POA: Diagnosis not present

## 2021-07-24 LAB — RAD ONC ARIA SESSION SUMMARY
Course Elapsed Days: 12
Plan Fractions Treated to Date: 8
Plan Prescribed Dose Per Fraction: 2.66 Gy
Plan Total Fractions Prescribed: 16
Plan Total Prescribed Dose: 42.56 Gy
Reference Point Dosage Given to Date: 21.28 Gy
Reference Point Session Dosage Given: 2.66 Gy
Session Number: 8

## 2021-07-25 ENCOUNTER — Other Ambulatory Visit: Payer: Self-pay

## 2021-07-25 ENCOUNTER — Ambulatory Visit
Admission: RE | Admit: 2021-07-25 | Discharge: 2021-07-25 | Disposition: A | Discharge status: Home / Self Care | Payer: Commercial Managed Care - HMO | Source: Ambulatory Visit | Attending: Radiation Oncology | Admitting: Radiation Oncology

## 2021-07-25 DIAGNOSIS — Z510 Encounter for antineoplastic radiation therapy: Secondary | ICD-10-CM | POA: Diagnosis not present

## 2021-07-25 LAB — RAD ONC ARIA SESSION SUMMARY
Course Elapsed Days: 13
Plan Fractions Treated to Date: 9
Plan Prescribed Dose Per Fraction: 2.66 Gy
Plan Total Fractions Prescribed: 16
Plan Total Prescribed Dose: 42.56 Gy
Reference Point Dosage Given to Date: 23.94 Gy
Reference Point Session Dosage Given: 2.66 Gy
Session Number: 9

## 2021-07-26 ENCOUNTER — Other Ambulatory Visit: Payer: Self-pay

## 2021-07-26 ENCOUNTER — Ambulatory Visit
Admission: RE | Admit: 2021-07-26 | Discharge: 2021-07-26 | Disposition: A | Discharge status: Home / Self Care | Payer: Commercial Managed Care - HMO | Source: Ambulatory Visit | Attending: Radiation Oncology | Admitting: Radiation Oncology

## 2021-07-26 ENCOUNTER — Encounter: Payer: Self-pay | Admitting: Family Medicine

## 2021-07-26 DIAGNOSIS — M545 Other chronic pain: Secondary | ICD-10-CM

## 2021-07-26 DIAGNOSIS — Z510 Encounter for antineoplastic radiation therapy: Secondary | ICD-10-CM | POA: Diagnosis not present

## 2021-07-26 DIAGNOSIS — M25412 Effusion, left shoulder: Secondary | ICD-10-CM

## 2021-07-26 DIAGNOSIS — M25562 Pain in left knee: Secondary | ICD-10-CM

## 2021-07-26 LAB — RAD ONC ARIA SESSION SUMMARY
Course Elapsed Days: 14
Plan Fractions Treated to Date: 10
Plan Prescribed Dose Per Fraction: 2.66 Gy
Plan Total Fractions Prescribed: 16
Plan Total Prescribed Dose: 42.56 Gy
Reference Point Dosage Given to Date: 26.6 Gy
Reference Point Session Dosage Given: 2.66 Gy
Session Number: 10

## 2021-07-26 MED ORDER — HYDROCODONE-ACETAMINOPHEN 5-325 MG PO TABS: 1.0000 | ORAL_TABLET | Freq: Two times a day (BID) | ORAL | 0 refills | Status: DC | PRN

## 2021-07-27 ENCOUNTER — Ambulatory Visit
Admission: RE | Admit: 2021-07-27 | Discharge: 2021-07-27 | Disposition: A | Discharge status: Home / Self Care | Payer: Commercial Managed Care - HMO | Source: Ambulatory Visit | Attending: Radiation Oncology | Admitting: Radiation Oncology

## 2021-07-27 ENCOUNTER — Other Ambulatory Visit: Payer: Self-pay

## 2021-07-27 DIAGNOSIS — Z510 Encounter for antineoplastic radiation therapy: Secondary | ICD-10-CM | POA: Diagnosis not present

## 2021-07-27 LAB — RAD ONC ARIA SESSION SUMMARY
Course Elapsed Days: 15
Plan Fractions Treated to Date: 11
Plan Prescribed Dose Per Fraction: 2.66 Gy
Plan Total Fractions Prescribed: 16
Plan Total Prescribed Dose: 42.56 Gy
Reference Point Dosage Given to Date: 29.26 Gy
Reference Point Session Dosage Given: 2.66 Gy
Session Number: 11

## 2021-07-30 ENCOUNTER — Ambulatory Visit
Admission: RE | Admit: 2021-07-30 | Discharge: 2021-07-30 | Disposition: A | Discharge status: Home / Self Care | Payer: Commercial Managed Care - HMO | Source: Ambulatory Visit | Attending: Radiation Oncology | Admitting: Radiation Oncology

## 2021-07-30 ENCOUNTER — Other Ambulatory Visit: Payer: Self-pay

## 2021-07-30 ENCOUNTER — Ambulatory Visit (HOSPITAL_COMMUNITY): Payer: Commercial Managed Care - HMO | Attending: Cardiovascular Disease

## 2021-07-30 DIAGNOSIS — E785 Hyperlipidemia, unspecified: Secondary | ICD-10-CM | POA: Insufficient documentation

## 2021-07-30 DIAGNOSIS — Z01818 Encounter for other preprocedural examination: Secondary | ICD-10-CM | POA: Diagnosis not present

## 2021-07-30 DIAGNOSIS — Z87891 Personal history of nicotine dependence: Secondary | ICD-10-CM | POA: Diagnosis not present

## 2021-07-30 DIAGNOSIS — Z923 Personal history of irradiation: Secondary | ICD-10-CM | POA: Diagnosis not present

## 2021-07-30 DIAGNOSIS — Z853 Personal history of malignant neoplasm of breast: Secondary | ICD-10-CM | POA: Insufficient documentation

## 2021-07-30 DIAGNOSIS — I10 Essential (primary) hypertension: Secondary | ICD-10-CM | POA: Insufficient documentation

## 2021-07-30 DIAGNOSIS — I2510 Atherosclerotic heart disease of native coronary artery without angina pectoris: Secondary | ICD-10-CM | POA: Insufficient documentation

## 2021-07-30 DIAGNOSIS — I351 Nonrheumatic aortic (valve) insufficiency: Secondary | ICD-10-CM | POA: Insufficient documentation

## 2021-07-30 DIAGNOSIS — Z951 Presence of aortocoronary bypass graft: Secondary | ICD-10-CM | POA: Diagnosis not present

## 2021-07-30 LAB — RAD ONC ARIA SESSION SUMMARY
Course Elapsed Days: 18
Plan Fractions Treated to Date: 12
Plan Prescribed Dose Per Fraction: 2.66 Gy
Plan Total Fractions Prescribed: 16
Plan Total Prescribed Dose: 42.56 Gy
Reference Point Dosage Given to Date: 31.92 Gy
Reference Point Session Dosage Given: 2.66 Gy
Session Number: 12

## 2021-07-30 LAB — ECHOCARDIOGRAM COMPLETE
Area-P 1/2: 3.36 cm2
P 1/2 time: 302 msec
S' Lateral: 2.6 cm

## 2021-07-31 ENCOUNTER — Other Ambulatory Visit: Payer: Self-pay

## 2021-07-31 ENCOUNTER — Ambulatory Visit
Admission: RE | Admit: 2021-07-31 | Discharge: 2021-07-31 | Disposition: A | Discharge status: Home / Self Care | Payer: Commercial Managed Care - HMO | Source: Ambulatory Visit | Attending: Radiation Oncology | Admitting: Radiation Oncology

## 2021-07-31 DIAGNOSIS — Z510 Encounter for antineoplastic radiation therapy: Secondary | ICD-10-CM | POA: Diagnosis not present

## 2021-07-31 LAB — RAD ONC ARIA SESSION SUMMARY
Course Elapsed Days: 19
Plan Fractions Treated to Date: 13
Plan Prescribed Dose Per Fraction: 2.66 Gy
Plan Total Fractions Prescribed: 16
Plan Total Prescribed Dose: 42.56 Gy
Reference Point Dosage Given to Date: 34.58 Gy
Reference Point Session Dosage Given: 2.66 Gy
Session Number: 13

## 2021-07-31 NOTE — Progress Notes (Incomplete)
Patient Care Team: Copland, Gay Filler, MD as PCP - General (Family Medicine) Stanford Breed Denice Bors, MD as PCP - Cardiology (Cardiology) Stanford Breed Denice Bors, MD as Consulting Physician (Cardiology) Mauro Kaufmann, RN as Oncology Nurse Navigator Rockwell Germany, RN as Oncology Nurse Navigator  DIAGNOSIS: No diagnosis found.  SUMMARY OF ONCOLOGIC HISTORY: Oncology History  Malignant neoplasm of upper-outer quadrant of right breast in female, estrogen receptor positive ([place])  04/25/2021 Initial Diagnosis   Screening mammogram detected right breast mass, by ultrasound measured 9 mm, biopsy revealed grade 1 IDC with low-grade DCIS, ER 95%, PR 90%, Ki-67 10%, HER2 2+ by IHC, FISH negative, ratio 1.17, copy #1.7   05/07/2021 Cancer Staging   Staging form: Breast, AJCC 8th Edition - Clinical stage from 05/07/2021: Stage IA (cT1b, cN0, cM0, G1, ER+, PR+, HER2-) - Signed by Nicholas Lose, MD on 05/17/2021 Stage prefix: Initial diagnosis Method of lymph node assessment: Clinical Histologic grading system: 3 grade system   06/06/2021 Surgery   06/06/2021: Right lumpectomy: Grade 2 IDC 1.2 cm, margins negative, 0/4 lymph nodes negative, ER 95%, PR 90%, HER2 negative, Ki-67 10%   07/04/2021 Cancer Staging   Staging form: Breast, AJCC 8th Edition - Pathologic stage from 07/04/2021: Stage IA (pT1c, pN0(sn), cM0, G2, ER+, PR+, HER2-, Oncotype DX score: 9) - Signed by Hayden Pedro, PA-C on 07/04/2021 Stage prefix: Initial diagnosis Method of lymph node assessment: Sentinel lymph node biopsy Multigene prognostic tests performed: Oncotype DX Recurrence score range: Less than 11 Histologic grading system: 3 grade system     CHIEF COMPLIANT: Follow up on Oncotype DX test result  INTERVAL HISTORY: Casey Huerta is a  66 y.o. female is here because of recent diagnosis of right breast cancer. She presents to the clinic today for a follow-up.   ALLERGIES:  is allergic to  simvastatin.  MEDICATIONS:  Current Outpatient Medications  Medication Sig Dispense Refill   ALPRAZolam (XANAX) 0.25 MG tablet TAKE 1 TABLET BY MOUTH ONCE DAILY AS NEEDED FOR ANXIETY 30 tablet 3   amLODipine (NORVASC) 5 MG tablet Take 1 tablet (5 mg total) by mouth daily. 90 tablet 3   aspirin 81 MG chewable tablet Chew 1 tablet (81 mg total) by mouth daily. 90 tablet 4   atenolol (TENORMIN) 12.5 mg TABS tablet Atenolol     celecoxib (CELEBREX) 100 MG capsule TAKE 1 CAPSULE BY MOUTH TWICE DAILY AS NEEDED FOR JOINT PAIN 180 capsule 3   HYDROcodone-acetaminophen (NORCO/VICODIN) 5-325 MG tablet Take 1 tablet by mouth every 12 (twelve) hours as needed for up to 30 doses for severe pain or moderate pain. 30 tablet 0   Multiple Vitamins-Minerals (MULTIVITAMIN WITH MINERALS) tablet Take 1 tablet by mouth daily.     nitroGLYCERIN (NITROSTAT) 0.4 MG SL tablet Place 1 tablet (0.4 mg total) under the tongue every 5 (five) minutes x 3 doses as needed for chest pain. 25 tablet 1   ondansetron (ZOFRAN-ODT) 4 MG disintegrating tablet Take 4 mg by mouth every 8 (eight) hours as needed.     rosuvastatin (CRESTOR) 20 MG tablet Take 1 tablet (20 mg total) by mouth daily. (Patient not taking: Reported on 07/03/2021) 90 tablet 3   traMADol (ULTRAM) 50 MG tablet TAKE 1 TABLET BY MOUTH EVERY DAY AT BEDTIME DO  NOT  MIX  WITH  XANAX 30 tablet 3   No current facility-administered medications for this visit.    PHYSICAL EXAMINATION: ECOG PERFORMANCE STATUS: {CHL ONC ECOG [phone]}  There were no  vitals filed for this visit. There were no vitals filed for this visit.  BREAST:*** No palpable masses or nodules in either right or left breasts. No palpable axillary supraclavicular or infraclavicular adenopathy no breast tenderness or nipple discharge. (exam performed in the presence of a chaperone)  LABORATORY DATA:  I have reviewed the data as listed    Latest Ref Rng & Units 05/29/2021    1:30 PM 03/05/2021     2:21 PM 02/24/2020   11:39 AM  CMP  Glucose 70 - 99 mg/dL 114  87  75   BUN 8 - 23 mg/dL _0 Creatinine 0.44 - 1.00 mg/dL 0.84  0.84  1.02   Sodium 135 - 145 mmol/L 139  142  139   Potassium 3.5 - 5.1 mmol/L 4.5  4.8  5.0   Chloride 98 - 111 mmol/L 106  105  103   CO2 22 - 32 mmol/L 27  30  32   Calcium 8.9 - 10.3 mg/dL 9.7  10.2  10.0   Total Protein 6.0 - 8.3 g/dL  7.8  7.3   Total Bilirubin 0.2 - 1.2 mg/dL  0.3  0.4   Alkaline Phos 39 - 117 U/L  93  98   AST 0 - 37 U/L  18  15   ALT 0 - 35 U/L  15  11     Lab Results  Component Value Date   WBC 5.6 03/05/2021   HGB 13.3 03/05/2021   HCT 41.3 03/05/2021   MCV 90.7 03/05/2021   PLT 385.0 03/05/2021   NEUTROABS 2.0 03/25/2017    ASSESSMENT & PLAN:  No problem-specific Assessment & Plan notes found for this encounter.    No orders of the defined types were placed in this encounter.  The patient has a good understanding of the overall plan. she agrees with it. she will call with any problems that may develop before the next visit here. Total time spent: 30 mins including face to face time and time spent for planning, charting and co-ordination of care   Suzzette Righter, [place] 07/31/21    I Gardiner Coins am scribing for Dr. Lindi Adie  ***.

## 2021-08-01 ENCOUNTER — Ambulatory Visit
Admission: RE | Admit: 2021-08-01 | Discharge: 2021-08-01 | Disposition: A | Discharge status: Home / Self Care | Payer: Commercial Managed Care - HMO | Source: Ambulatory Visit | Attending: Radiation Oncology | Admitting: Radiation Oncology

## 2021-08-01 ENCOUNTER — Other Ambulatory Visit: Payer: Self-pay

## 2021-08-01 DIAGNOSIS — Z510 Encounter for antineoplastic radiation therapy: Secondary | ICD-10-CM | POA: Diagnosis not present

## 2021-08-01 LAB — RAD ONC ARIA SESSION SUMMARY
Course Elapsed Days: 20
Plan Fractions Treated to Date: 14
Plan Prescribed Dose Per Fraction: 2.66 Gy
Plan Total Fractions Prescribed: 16
Plan Total Prescribed Dose: 42.56 Gy
Reference Point Dosage Given to Date: 37.24 Gy
Reference Point Session Dosage Given: 2.66 Gy
Session Number: 14

## 2021-08-02 ENCOUNTER — Ambulatory Visit
Admission: RE | Admit: 2021-08-02 | Discharge: 2021-08-02 | Disposition: A | Discharge status: Home / Self Care | Payer: Commercial Managed Care - HMO | Source: Ambulatory Visit | Attending: Radiation Oncology | Admitting: Radiation Oncology

## 2021-08-02 ENCOUNTER — Other Ambulatory Visit: Payer: Self-pay

## 2021-08-02 DIAGNOSIS — Z510 Encounter for antineoplastic radiation therapy: Secondary | ICD-10-CM | POA: Diagnosis not present

## 2021-08-02 LAB — RAD ONC ARIA SESSION SUMMARY
Course Elapsed Days: 21
Plan Fractions Treated to Date: 15
Plan Prescribed Dose Per Fraction: 2.66 Gy
Plan Total Fractions Prescribed: 16
Plan Total Prescribed Dose: 42.56 Gy
Reference Point Dosage Given to Date: 39.9 Gy
Reference Point Session Dosage Given: 2.66 Gy
Session Number: 15

## 2021-08-03 ENCOUNTER — Other Ambulatory Visit: Payer: Self-pay

## 2021-08-03 ENCOUNTER — Ambulatory Visit
Admission: RE | Admit: 2021-08-03 | Discharge: 2021-08-03 | Disposition: A | Discharge status: Home / Self Care | Payer: Commercial Managed Care - HMO | Source: Ambulatory Visit | Attending: Radiation Oncology | Admitting: Radiation Oncology

## 2021-08-03 DIAGNOSIS — Z510 Encounter for antineoplastic radiation therapy: Secondary | ICD-10-CM | POA: Diagnosis not present

## 2021-08-03 LAB — RAD ONC ARIA SESSION SUMMARY
Course Elapsed Days: 22
Plan Fractions Treated to Date: 16
Plan Prescribed Dose Per Fraction: 2.66 Gy
Plan Total Fractions Prescribed: 16
Plan Total Prescribed Dose: 42.56 Gy
Reference Point Dosage Given to Date: 42.56 Gy
Reference Point Session Dosage Given: 2.66 Gy
Session Number: 16

## 2021-08-06 ENCOUNTER — Other Ambulatory Visit: Payer: Self-pay

## 2021-08-06 ENCOUNTER — Encounter: Payer: Self-pay | Admitting: Family Medicine

## 2021-08-06 ENCOUNTER — Other Ambulatory Visit: Payer: Self-pay | Admitting: Family Medicine

## 2021-08-06 ENCOUNTER — Ambulatory Visit
Admission: RE | Admit: 2021-08-06 | Discharge: 2021-08-06 | Disposition: A | Discharge status: Home / Self Care | Payer: Commercial Managed Care - HMO | Source: Ambulatory Visit | Attending: Radiation Oncology | Admitting: Radiation Oncology

## 2021-08-06 DIAGNOSIS — Z79899 Other long term (current) drug therapy: Secondary | ICD-10-CM

## 2021-08-06 DIAGNOSIS — Z510 Encounter for antineoplastic radiation therapy: Secondary | ICD-10-CM | POA: Diagnosis not present

## 2021-08-06 DIAGNOSIS — G8929 Other chronic pain: Secondary | ICD-10-CM

## 2021-08-06 DIAGNOSIS — M25412 Effusion, left shoulder: Secondary | ICD-10-CM

## 2021-08-06 DIAGNOSIS — M25562 Pain in left knee: Secondary | ICD-10-CM

## 2021-08-06 LAB — RAD ONC ARIA SESSION SUMMARY
Course Elapsed Days: 25
Plan Fractions Treated to Date: 1
Plan Prescribed Dose Per Fraction: 2 Gy
Plan Total Fractions Prescribed: 4
Plan Total Prescribed Dose: 8 Gy
Reference Point Dosage Given to Date: 44.56 Gy
Reference Point Session Dosage Given: 2 Gy
Session Number: 17

## 2021-08-06 MED ORDER — LIDOCAINE 5 % EX PTCH: 1.0000 | MEDICATED_PATCH | CUTANEOUS | 1 refills | Status: DC

## 2021-08-06 MED ORDER — NALOXONE HCL 4 MG/0.1ML NA LIQD: NASAL | 99 refills | Status: AC

## 2021-08-06 NOTE — Progress Notes (Signed)
Spoke with pt husband- he let me know pt would like to have lidocaine patches for MSK pain.  We would also like her to have narcan as she is on chronic pain medication

## 2021-08-07 ENCOUNTER — Ambulatory Visit
Admission: RE | Admit: 2021-08-07 | Discharge: 2021-08-07 | Disposition: A | Discharge status: Home / Self Care | Payer: Commercial Managed Care - HMO | Source: Ambulatory Visit | Attending: Radiation Oncology | Admitting: Radiation Oncology

## 2021-08-07 ENCOUNTER — Other Ambulatory Visit: Payer: Self-pay

## 2021-08-07 DIAGNOSIS — Z510 Encounter for antineoplastic radiation therapy: Secondary | ICD-10-CM | POA: Diagnosis not present

## 2021-08-07 LAB — RAD ONC ARIA SESSION SUMMARY
Course Elapsed Days: 26
Plan Fractions Treated to Date: 2
Plan Prescribed Dose Per Fraction: 2 Gy
Plan Total Fractions Prescribed: 4
Plan Total Prescribed Dose: 8 Gy
Reference Point Dosage Given to Date: 46.56 Gy
Reference Point Session Dosage Given: 2 Gy
Session Number: 18

## 2021-08-08 ENCOUNTER — Other Ambulatory Visit: Payer: Self-pay

## 2021-08-08 ENCOUNTER — Ambulatory Visit
Admission: RE | Admit: 2021-08-08 | Discharge: 2021-08-08 | Disposition: A | Discharge status: Home / Self Care | Payer: Commercial Managed Care - HMO | Source: Ambulatory Visit | Attending: Radiation Oncology | Admitting: Radiation Oncology

## 2021-08-08 DIAGNOSIS — Z510 Encounter for antineoplastic radiation therapy: Secondary | ICD-10-CM | POA: Diagnosis not present

## 2021-08-08 LAB — RAD ONC ARIA SESSION SUMMARY
Course Elapsed Days: 27
Plan Fractions Treated to Date: 3
Plan Prescribed Dose Per Fraction: 2 Gy
Plan Total Fractions Prescribed: 4
Plan Total Prescribed Dose: 8 Gy
Reference Point Dosage Given to Date: 48.56 Gy
Reference Point Session Dosage Given: 2 Gy
Session Number: 19

## 2021-08-09 ENCOUNTER — Other Ambulatory Visit: Payer: Self-pay

## 2021-08-09 ENCOUNTER — Encounter: Payer: Self-pay | Admitting: Radiation Oncology

## 2021-08-09 ENCOUNTER — Encounter: Payer: Self-pay | Admitting: *Deleted

## 2021-08-09 ENCOUNTER — Ambulatory Visit: Payer: Commercial Managed Care - HMO

## 2021-08-09 ENCOUNTER — Ambulatory Visit
Admission: RE | Admit: 2021-08-09 | Discharge: 2021-08-09 | Disposition: A | Discharge status: Home / Self Care | Payer: Commercial Managed Care - HMO | Source: Ambulatory Visit | Attending: Radiation Oncology | Admitting: Radiation Oncology

## 2021-08-09 DIAGNOSIS — Z510 Encounter for antineoplastic radiation therapy: Secondary | ICD-10-CM | POA: Diagnosis not present

## 2021-08-09 LAB — RAD ONC ARIA SESSION SUMMARY
Course Elapsed Days: 28
Plan Fractions Treated to Date: 4
Plan Prescribed Dose Per Fraction: 2 Gy
Plan Total Fractions Prescribed: 4
Plan Total Prescribed Dose: 8 Gy
Reference Point Dosage Given to Date: 50.56 Gy
Reference Point Session Dosage Given: 2 Gy
Session Number: 20

## 2021-08-14 ENCOUNTER — Inpatient Hospital Stay: Payer: Commercial Managed Care - HMO | Attending: Hematology and Oncology | Admitting: Hematology and Oncology

## 2021-08-14 ENCOUNTER — Other Ambulatory Visit: Payer: Self-pay

## 2021-08-14 DIAGNOSIS — Z170 Estrogen receptor positive status [ER+]: Secondary | ICD-10-CM | POA: Diagnosis not present

## 2021-08-14 DIAGNOSIS — Z79899 Other long term (current) drug therapy: Secondary | ICD-10-CM | POA: Insufficient documentation

## 2021-08-14 DIAGNOSIS — C50411 Malignant neoplasm of upper-outer quadrant of right female breast: Secondary | ICD-10-CM | POA: Diagnosis present

## 2021-08-14 DIAGNOSIS — Z79811 Long term (current) use of aromatase inhibitors: Secondary | ICD-10-CM | POA: Insufficient documentation

## 2021-08-14 DIAGNOSIS — Z923 Personal history of irradiation: Secondary | ICD-10-CM | POA: Insufficient documentation

## 2021-08-14 MED ORDER — LETROZOLE 2.5 MG PO TABS: 2.5000 mg | ORAL_TABLET | Freq: Every day | ORAL | 3 refills | Status: DC

## 2021-08-22 ENCOUNTER — Encounter: Payer: Self-pay | Admitting: Family Medicine

## 2021-08-22 DIAGNOSIS — M25562 Pain in left knee: Secondary | ICD-10-CM

## 2021-08-22 DIAGNOSIS — M545 Low back pain, unspecified: Secondary | ICD-10-CM

## 2021-08-22 DIAGNOSIS — M25412 Effusion, left shoulder: Secondary | ICD-10-CM

## 2021-08-22 MED ORDER — TRAMADOL HCL 50 MG PO TABS: ORAL_TABLET | ORAL | 3 refills | Status: DC

## 2021-08-22 MED ORDER — HYDROCODONE-ACETAMINOPHEN 5-325 MG PO TABS: 1.0000 | ORAL_TABLET | Freq: Two times a day (BID) | ORAL | 0 refills | Status: DC | PRN

## 2021-08-22 NOTE — Addendum Note (Signed)
Addended by: Lamar Blinks C on: 08/22/2021 01:37 PM   Modules accepted: Orders

## 2021-08-22 NOTE — Progress Notes (Unsigned)
[facility] at [facility] [address] [place], [place] [address] [phone]  Date:  08/29/2021   Name:  Casey Huerta   DOB:  September 25, 1955   MRN:  [id number]  PCP:  Darreld Mclean, MD    Chief Complaint: No chief complaint on file.   History of Present Illness:  Casey Huerta is a 66 y.o. very pleasant female patient who presents with the following:  Patient seen today for follow-up from recent surgery History of hypertension, dyslipidemia, aortic insufficiency, CABG in 2001, prediabetes, chronic pain due to arthritis most prominent in lumbar spine and knees Riyanna was diagnosed with breast cancer in March of this year She underwent a lumpectomy in April and has completed radiation She is currently on antiestrogen therapy with letrozole 2.5 mg daily  I am treating her chronic joint pain with Celebrex, hydrocodone 5 #30/month, tramadol 50 number 30/month  Needs pap updated  UDS is UTD   Patient Active Problem List   Diagnosis Date Noted   Family history of prostate cancer 05/23/2021   Family history of ovarian cancer 05/23/2021   Family history of leukemia 05/23/2021   Malignant neoplasm of upper-outer quadrant of right breast in female, estrogen receptor positive ([place]) 05/03/2021   Prediabetes 02/28/2020   Osteoarthritis of both knees 06/14/2016   Breast mass 05/17/2016   Aortic insufficiency 03/06/2012   Dyslipidemia 06/19/2007   Essential hypertension 06/19/2007   Hx of CABG 06/19/2007    Past Medical History:  Diagnosis Date   Aortic insufficiency    a. Mild-mod AI by echo 2010.   Arthritis    Breast cancer ([place]) 04/25/2021   Breast mass 05/17/2016   BIRADS 3: biopsy revealed benign fibroadenoma   COPD    CORONARY ARTERY DISEASE    a. CABG (LIMA-->LAD 2001). b. Myoview 2008 no ischemia, EF 63%.  c. cath 10/14/2013 mild 20% ost LAD, atretic LIMA, EF 55%, medical therapy   Family history of leukemia     Family history of ovarian cancer    Family history of prostate cancer    Hematemesis 09/2018   Hyperlipidemia    MENORRHAGIA    Unspecified essential hypertension     Past Surgical History:  Procedure Laterality Date   arthroscopic knee surgery     BIOPSY  09/23/2018   Procedure: BIOPSY;  Surgeon: Juanita Craver, MD;  Location: [facility];  Service: Endoscopy;;   BREAST BIOPSY Right 05/28/2016   HYALINIZED FIBROADENOMA   BREAST LUMPECTOMY WITH RADIOACTIVE SEED AND SENTINEL LYMPH NODE BIOPSY Right 06/06/2021   Procedure: RIGHT BREAST LUMPECTOMY WITH RADIOACTIVE SEED AND SENTINEL LYMPH NODE BIOPSY;  Surgeon: Jovita Kussmaul, MD;  Location: [facility];  Service: General;  Laterality: Right;   CESAREAN SECTION     CORONARY ARTERY BYPASS GRAFT  2001   ESOPHAGOGASTRODUODENOSCOPY (EGD) WITH PROPOFOL Left 09/23/2018   Procedure: ESOPHAGOGASTRODUODENOSCOPY (EGD) WITH PROPOFOL;  Surgeon: Juanita Craver, MD;  Location: [facility] ENDOSCOPY;  Service: Endoscopy;  Laterality: Left;   LEFT HEART CATH AND CORONARY ANGIOGRAPHY N/A 01/17/2017   Procedure: LEFT HEART CATH AND CORONARY ANGIOGRAPHY;  Surgeon: Belva Crome, MD;  Location: [facility] CV LAB;  Service: Cardiovascular;  Laterality: N/A;   LEFT HEART CATHETERIZATION WITH CORONARY/GRAFT ANGIOGRAM N/A 10/12/2013   Procedure: LEFT HEART CATHETERIZATION WITH Beatrix Fetters;  Surgeon: Troy Sine, MD;  Location: [facility] CATH LAB;  Service: Cardiovascular;  Laterality: N/A;    Social History  Tobacco Use   Smoking status: Former    Types: Cigarettes    Quit date: 1980    Years since quitting: 43.5   Smokeless tobacco: Never   Tobacco comments:    QUIT SMOKING IN 1999  Vaping Use   Vaping Use: Never used  Substance Use Topics   Alcohol use: Yes    Comment: friday nights - 2 glasses - rare   Drug use: No    Comment:  quit using in 1985    Family History  Problem Relation Age of Onset   Heart disease Mother    Cancer  Mother        possibly ovarian cancer   Lung cancer Father    Heart disease Sister    Ovarian cancer Sister    Cancer - Prostate Brother 15   Leukemia Niece        great niece died of leukemia at 64    Allergies  Allergen Reactions   Simvastatin Other (See Comments)    REACTION: constipation    Medication list has been reviewed and updated.  Current Outpatient Medications on File Prior to Visit  Medication Sig Dispense Refill   ALPRAZolam (XANAX) 0.25 MG tablet TAKE 1 TABLET BY MOUTH ONCE DAILY AS NEEDED FOR ANXIETY 30 tablet 3   amLODipine (NORVASC) 5 MG tablet Take 1 tablet (5 mg total) by mouth daily. 90 tablet 3   aspirin 81 MG chewable tablet Chew 1 tablet (81 mg total) by mouth daily. 90 tablet 4   atenolol (TENORMIN) 12.5 mg TABS tablet Atenolol     celecoxib (CELEBREX) 100 MG capsule TAKE 1 CAPSULE BY MOUTH TWICE DAILY AS NEEDED FOR JOINT PAIN 180 capsule 3   HYDROcodone-acetaminophen (NORCO/VICODIN) 5-325 MG tablet Take 1 tablet by mouth every 12 (twelve) hours as needed for up to 30 doses for severe pain or moderate pain. 30 tablet 0   HYDROcodone-acetaminophen (NORCO/VICODIN) 5-325 MG tablet Take 1 tablet by mouth every 12 (twelve) hours as needed for up to 30 doses. To fill in 30 days 30 tablet 0   letrozole (FEMARA) 2.5 MG tablet Take 1 tablet (2.5 mg total) by mouth daily. 90 tablet 3   lidocaine (LIDODERM) 5 % Place 1 patch onto the skin daily. Remove & Discard patch within 12 hours.  Use as needed for pain.  Do not apply to skin under radiation treatment 30 patch 1   Multiple Vitamins-Minerals (MULTIVITAMIN WITH MINERALS) tablet Take 1 tablet by mouth daily.     naloxone (NARCAN) nasal spray 4 mg/0.1 mL One spray in nostril as needed for overdose - repeat in 4 minutes as needed until help arrives 1 each PRN   nitroGLYCERIN (NITROSTAT) 0.4 MG SL tablet Place 1 tablet (0.4 mg total) under the tongue every 5 (five) minutes x 3 doses as needed for chest pain. 25 tablet 1    ondansetron (ZOFRAN-ODT) 4 MG disintegrating tablet Take 4 mg by mouth every 8 (eight) hours as needed.     rosuvastatin (CRESTOR) 20 MG tablet Take 1 tablet (20 mg total) by mouth daily. 90 tablet 3   traMADol (ULTRAM) 50 MG tablet TAKE 1 TABLET BY MOUTH EVERY DAY AT BEDTIME DO  NOT  MIX  WITH  XANAX 30 tablet 3   No current facility-administered medications on file prior to visit.    Review of Systems:  As per HPI- otherwise negative.   Physical Examination: There were no vitals filed for this visit. There were no vitals filed  for this visit. There is no height or weight on file to calculate BMI. Ideal Body Weight:    GEN: no acute distress. HEENT: Atraumatic, Normocephalic.  Ears and Nose: No external deformity. CV: RRR, No M/G/R. No JVD. No thrill. No extra heart sounds. PULM: CTA B, no wheezes, crackles, rhonchi. No retractions. No resp. distress. No accessory muscle use. ABD: S, NT, ND, +BS. No rebound. No HSM. EXTR: No c/c/e PSYCH: Normally interactive. Conversant.    Assessment and Plan: ***  Signed Lamar Blinks, MD

## 2021-08-29 ENCOUNTER — Ambulatory Visit (INDEPENDENT_AMBULATORY_CARE_PROVIDER_SITE_OTHER): Payer: Commercial Managed Care - HMO | Admitting: Family Medicine

## 2021-08-29 ENCOUNTER — Other Ambulatory Visit (HOSPITAL_COMMUNITY)
Admission: RE | Admit: 2021-08-29 | Discharge: 2021-08-29 | Disposition: A | Discharge status: Home / Self Care | Payer: Commercial Managed Care - HMO | Source: Ambulatory Visit | Attending: Family Medicine | Admitting: Family Medicine

## 2021-08-29 VITALS — BP 132/60 | HR 76 | Temp 97.8°F | Resp 18 | Ht 62.0 in | Wt 130.2 lb

## 2021-08-29 DIAGNOSIS — R5383 Other fatigue: Secondary | ICD-10-CM

## 2021-08-29 DIAGNOSIS — M79644 Pain in right finger(s): Secondary | ICD-10-CM

## 2021-08-29 DIAGNOSIS — Z124 Encounter for screening for malignant neoplasm of cervix: Secondary | ICD-10-CM

## 2021-08-29 DIAGNOSIS — M25561 Pain in right knee: Secondary | ICD-10-CM

## 2021-08-29 DIAGNOSIS — M545 Low back pain, unspecified: Secondary | ICD-10-CM | POA: Diagnosis not present

## 2021-08-29 DIAGNOSIS — G8929 Other chronic pain: Secondary | ICD-10-CM

## 2021-08-29 DIAGNOSIS — M25562 Pain in left knee: Secondary | ICD-10-CM

## 2021-08-29 NOTE — Patient Instructions (Addendum)
It was great to see you again today- I will be in touch with your labs and pap asap   Assuming all is well please see me in about 6 months

## 2021-08-30 ENCOUNTER — Encounter: Payer: Self-pay | Admitting: Family Medicine

## 2021-08-30 LAB — COMPREHENSIVE METABOLIC PANEL
ALT: 19 U/L (ref 0–35)
AST: 23 U/L (ref 0–37)
Albumin: 4.5 g/dL (ref 3.5–5.2)
Alkaline Phosphatase: 85 U/L (ref 39–117)
BUN: 12 mg/dL (ref 6–23)
CO2: 29 mEq/L (ref 19–32)
Calcium: 10.3 mg/dL (ref 8.4–10.5)
Chloride: 105 mEq/L (ref 96–112)
Creatinine, Ser: 0.94 mg/dL (ref 0.40–1.20)
GFR: 63.95 mL/min (ref >60.00)
Glucose, Bld: 66 mg/dL — ABNORMAL LOW (ref 70–99)
Potassium: 4.9 mEq/L (ref 3.5–5.1)
Sodium: 142 mEq/L (ref 135–145)
Total Bilirubin: 0.4 mg/dL (ref 0.2–1.2)
Total Protein: 7.4 g/dL (ref 6.0–8.3)

## 2021-08-30 LAB — CBC
HCT: 42 % (ref 36.0–46.0)
Hemoglobin: 13.6 g/dL (ref 12.0–15.0)
MCHC: 32.4 g/dL (ref 30.0–36.0)
MCV: 92.3 fl (ref 78.0–100.0)
Platelets: 217 10*3/uL (ref 150.0–400.0)
RBC: 4.55 Mil/uL (ref 3.87–5.11)
RDW: 13.2 % (ref 11.5–15.5)
WBC: 4.1 10*3/uL (ref 4.0–10.5)

## 2021-08-30 LAB — TSH: TSH: 0.98 u[IU]/mL (ref 0.35–5.50)

## 2021-09-03 ENCOUNTER — Encounter: Payer: Self-pay | Admitting: Family Medicine

## 2021-09-03 LAB — CYTOLOGY - PAP
Adequacy: ABSENT
Comment: NEGATIVE
Diagnosis: NEGATIVE
High risk HPV: NEGATIVE

## 2021-09-11 ENCOUNTER — Other Ambulatory Visit: Payer: Self-pay | Admitting: Family Medicine

## 2021-09-11 DIAGNOSIS — I10 Essential (primary) hypertension: Secondary | ICD-10-CM

## 2021-09-13 ENCOUNTER — Encounter: Payer: Self-pay | Admitting: Family Medicine

## 2021-09-13 MED ORDER — OXYCODONE HCL 5 MG PO CAPS: 5.0000 mg | ORAL_CAPSULE | Freq: Four times a day (QID) | ORAL | 0 refills | Status: DC | PRN

## 2021-09-17 ENCOUNTER — Ambulatory Visit
Admission: RE | Admit: 2021-09-17 | Discharge: 2021-09-17 | Disposition: A | Discharge status: Home / Self Care | Payer: Commercial Managed Care - HMO | Source: Ambulatory Visit | Attending: Adult Health | Admitting: Adult Health

## 2021-09-17 DIAGNOSIS — Z170 Estrogen receptor positive status [ER+]: Secondary | ICD-10-CM

## 2021-09-25 ENCOUNTER — Telehealth: Payer: Self-pay

## 2021-09-25 ENCOUNTER — Encounter: Payer: Self-pay | Admitting: Family Medicine

## 2021-09-25 NOTE — Telephone Encounter (Signed)
PA initiated via Covermymeds; KEY: [id number]. PA approved.   Effective 02/18/21 to 02/17/22

## 2021-09-25 NOTE — Telephone Encounter (Signed)
PA initiated via Covermymeds; KEY: [id number]. PA approved.   Effective 02/18/21 to 02/17/22

## 2021-09-29 ENCOUNTER — Encounter: Payer: Self-pay | Admitting: Family Medicine

## 2021-09-29 DIAGNOSIS — F430 Acute stress reaction: Secondary | ICD-10-CM

## 2021-09-30 MED ORDER — ALPRAZOLAM 0.25 MG PO TABS: 0.2500 mg | ORAL_TABLET | Freq: Every day | ORAL | 3 refills | Status: DC | PRN

## 2021-10-01 ENCOUNTER — Ambulatory Visit: Payer: Managed Care, Other (non HMO)

## 2021-10-01 NOTE — Progress Notes (Addendum)
  Radiation Oncology         [phone]) [phone] ________________________________  Name: Casey Huerta MRN: [id number]  Date of Service: 09/17/2021  DOB: Jun 29, 1955  Post Treatment Telephone Note  Diagnosis:   Stage IA, pT1cN0M0, grade 2, ER/PR positive invasive ductal carcinoma of the right breast.   Intent: Curative  Radiation Treatment Dates: 07/12/2021 through 08/09/2021 Site Technique Total Dose (Gy) Dose per Fx (Gy) Completed Fx Beam Energies  Breast, Right: Breast_R 3D 42.56/42.56 2.66 16/16 6X  Breast, Right: Breast_R_Bst 3D 8/8 2 4/4 6X, 10X   Narrative: The patient tolerated radiation therapy relatively well. She developed fatigue and anticipated skin changes in the treatment field.    Impression/Plan: 1. Stage IA, pT1cN0M0, grade 2, ER/PR positive invasive ductal carcinoma of the right breast. The patient has been doing well since completion of radiotherapy. We discussed that we would be happy to continue to follow her as needed, but she will also continue to follow up with Dr. Lindi Adie in medical oncology. She was counseled on skin care as well as measures to avoid sun exposure to this area.  2. Survivorship. We discussed the importance of survivorship evaluation and encouraged her to attend her upcoming visit with that clinic.      Carola Rhine, PAC

## 2021-10-02 ENCOUNTER — Telehealth: Payer: Self-pay

## 2021-10-02 NOTE — Telephone Encounter (Signed)
PA initiated via Covermymeds; KEY: [id number]. Awaiting determination.

## 2021-10-02 NOTE — Telephone Encounter (Signed)
PA approved.   PA Case: [id number], Status: Approved, Coverage Starts on: 02/18/2021 12:00:00 AM, Coverage Ends on: 02/17/2022 12:00:00 AM. Questions? Contact [phone].

## 2021-10-08 ENCOUNTER — Ambulatory Visit: Payer: Commercial Managed Care - HMO | Admitting: Rehabilitation

## 2021-10-12 ENCOUNTER — Encounter: Payer: Self-pay | Admitting: Family Medicine

## 2021-10-12 DIAGNOSIS — G8929 Other chronic pain: Secondary | ICD-10-CM

## 2021-10-12 DIAGNOSIS — M25562 Pain in left knee: Secondary | ICD-10-CM

## 2021-10-12 DIAGNOSIS — M25412 Effusion, left shoulder: Secondary | ICD-10-CM

## 2021-10-12 MED ORDER — LIDOCAINE 5 % EX PTCH: 1.0000 | MEDICATED_PATCH | CUTANEOUS | 1 refills | Status: DC

## 2021-10-13 ENCOUNTER — Other Ambulatory Visit: Payer: Self-pay | Admitting: Family Medicine

## 2021-10-13 ENCOUNTER — Encounter: Payer: Self-pay | Admitting: Family Medicine

## 2021-10-13 DIAGNOSIS — E7800 Pure hypercholesterolemia, unspecified: Secondary | ICD-10-CM

## 2021-10-14 MED ORDER — ROSUVASTATIN CALCIUM 20 MG PO TABS: 20.0000 mg | ORAL_TABLET | Freq: Every day | ORAL | 3 refills | Status: DC

## 2021-10-15 ENCOUNTER — Telehealth: Payer: Self-pay

## 2021-10-15 NOTE — Telephone Encounter (Signed)
PA initiated via Covermymeds; KEY: [id number]. Awaiting determination.

## 2021-10-15 NOTE — Telephone Encounter (Signed)
Opened in error

## 2021-10-16 NOTE — Telephone Encounter (Signed)
Forms faxed back to insurance.

## 2021-10-17 NOTE — Telephone Encounter (Signed)
Rx approved through 02/17/22

## 2021-10-22 ENCOUNTER — Encounter: Payer: Self-pay | Admitting: Family Medicine

## 2021-10-22 DIAGNOSIS — G8929 Other chronic pain: Secondary | ICD-10-CM

## 2021-10-22 DIAGNOSIS — M25412 Effusion, left shoulder: Secondary | ICD-10-CM

## 2021-10-22 DIAGNOSIS — M25562 Pain in left knee: Secondary | ICD-10-CM

## 2021-10-23 MED ORDER — HYDROCODONE-ACETAMINOPHEN 5-325 MG PO TABS: 1.0000 | ORAL_TABLET | Freq: Two times a day (BID) | ORAL | 0 refills | Status: DC | PRN

## 2021-10-23 MED ORDER — TRAMADOL HCL 50 MG PO TABS: ORAL_TABLET | ORAL | 3 refills | Status: DC

## 2021-11-18 ENCOUNTER — Encounter: Payer: Self-pay | Admitting: Family Medicine

## 2021-11-18 DIAGNOSIS — M545 Low back pain, unspecified: Secondary | ICD-10-CM

## 2021-11-18 DIAGNOSIS — M25562 Pain in left knee: Secondary | ICD-10-CM

## 2021-11-18 DIAGNOSIS — M25412 Effusion, left shoulder: Secondary | ICD-10-CM

## 2021-11-19 ENCOUNTER — Inpatient Hospital Stay: Payer: Medicare HMO | Attending: Adult Health | Admitting: Adult Health

## 2021-11-19 ENCOUNTER — Telehealth: Payer: Self-pay

## 2021-11-19 ENCOUNTER — Other Ambulatory Visit: Payer: Self-pay

## 2021-11-19 ENCOUNTER — Encounter: Payer: Self-pay | Admitting: Adult Health

## 2021-11-19 VITALS — BP 131/85 | HR 96 | Temp 98.3°F | Resp 18 | Ht 62.0 in | Wt 130.8 lb

## 2021-11-19 DIAGNOSIS — Z923 Personal history of irradiation: Secondary | ICD-10-CM | POA: Diagnosis not present

## 2021-11-19 DIAGNOSIS — Z170 Estrogen receptor positive status [ER+]: Secondary | ICD-10-CM | POA: Diagnosis not present

## 2021-11-19 DIAGNOSIS — C50411 Malignant neoplasm of upper-outer quadrant of right female breast: Secondary | ICD-10-CM | POA: Insufficient documentation

## 2021-11-19 DIAGNOSIS — Z7982 Long term (current) use of aspirin: Secondary | ICD-10-CM | POA: Diagnosis not present

## 2021-11-19 DIAGNOSIS — Z79899 Other long term (current) drug therapy: Secondary | ICD-10-CM | POA: Diagnosis not present

## 2021-11-19 DIAGNOSIS — E2839 Other primary ovarian failure: Secondary | ICD-10-CM | POA: Diagnosis not present

## 2021-11-19 DIAGNOSIS — Z87891 Personal history of nicotine dependence: Secondary | ICD-10-CM | POA: Insufficient documentation

## 2021-11-19 DIAGNOSIS — J449 Chronic obstructive pulmonary disease, unspecified: Secondary | ICD-10-CM | POA: Diagnosis not present

## 2021-11-19 MED ORDER — HYDROCODONE-ACETAMINOPHEN 5-325 MG PO TABS: 1.0000 | ORAL_TABLET | Freq: Two times a day (BID) | ORAL | 0 refills | Status: DC | PRN

## 2021-11-19 NOTE — Progress Notes (Signed)
S[facility]  VISIT:   BRIEF ONCOLOGIC HISTORY:  Oncology History  Malignant neoplasm of upper-outer quadrant of right breast in female, estrogen receptor positive ([place])  04/25/2021 Initial Diagnosis   Screening mammogram detected right breast mass, by ultrasound measured 9 mm, biopsy revealed grade 1 IDC with low-grade DCIS, ER 95%, PR 90%, Ki-67 10%, HER2 2+ by IHC, FISH negative, ratio 1.17, copy #1.7   05/07/2021 Cancer Staging   Staging form: Breast, AJCC 8th Edition - Clinical stage from 05/07/2021: Stage IA (cT1b, cN0, cM0, G1, ER+, PR+, HER2-) - Signed by Nicholas Lose, MD on 05/17/2021 Stage prefix: Initial diagnosis Method of lymph node assessment: Clinical Histologic grading system: 3 grade system   06/06/2021 Surgery   06/06/2021: Right lumpectomy: Grade 2 IDC 1.2 cm, margins negative, 0/4 lymph nodes negative, ER 95%, PR 90%, HER2 negative, Ki-67 10%   06/19/2021 Oncotype testing   Score 9 (ROR 3%)   07/04/2021 Cancer Staging   Staging form: Breast, AJCC 8th Edition - Pathologic stage from 07/04/2021: Stage IA (pT1c, pN0(sn), cM0, G2, ER+, PR+, HER2-, Oncotype DX score: 9) - Signed by Hayden Pedro, PA-C on 07/04/2021 Stage prefix: Initial diagnosis Method of lymph node assessment: Sentinel lymph node biopsy Multigene prognostic tests performed: Oncotype DX Recurrence score range: Less than 11 Histologic grading system: 3 grade system   07/13/2021 - 08/09/2021 Radiation Therapy   Site Technique Total Dose (Gy) Dose per Fx (Gy) Completed Fx Beam Energies  Breast, Right: Breast_R 3D 42.56/42.56 2.66 16/16 6X  Breast, Right: Breast_R_Bst 3D 8/8 2 4/4 6X, 10X       INTERVAL HISTORY:  Casey Huerta to review her survivorship care plan detailing her treatment course for breast cancer, as well as monitoring long-term side effects of that treatment, education regarding health maintenance, screening, and overall wellness and health promotion.     Overall, Casey Huerta reports  feeling quite well.  She is taking letrozole daily with good tolerance.  She notes some right breast swelling where she previously had surgery and says it feels heavier than the other.  REVIEW OF SYSTEMS:  Review of Systems  Constitutional:  Negative for appetite change, chills, fatigue, fever and unexpected weight change.  HENT:   Negative for hearing loss, lump/mass and trouble swallowing.   Eyes:  Negative for eye problems and icterus.  Respiratory:  Negative for chest tightness, cough and shortness of breath.   Cardiovascular:  Negative for chest pain, leg swelling and palpitations.  Gastrointestinal:  Negative for abdominal distention, abdominal pain, constipation, diarrhea, nausea and vomiting.  Endocrine: Negative for hot flashes.  Genitourinary:  Negative for difficulty urinating.   Musculoskeletal:  Negative for arthralgias.  Skin:  Negative for itching and rash.  Neurological:  Negative for dizziness, extremity weakness, headaches and numbness.  Hematological:  Negative for adenopathy. Does not bruise/bleed easily.  Psychiatric/Behavioral:  Negative for depression. The patient is not nervous/anxious.   Breast: Denies any new nodularity, masses, tenderness, nipple changes, or nipple discharge.      ONCOLOGY TREATMENT TEAM:  1. Surgeon:  Dr. Marlou Starks at [facility] 2. Medical Oncologist: Dr. Madolyn Frieze  3. Radiation Oncologist: Dr. Lisbeth Renshaw    PAST MEDICAL/SURGICAL HISTORY:  Past Medical History:  Diagnosis Date   Aortic insufficiency    a. Mild-mod AI by echo 2010.   Arthritis    Breast cancer ([place]) 04/25/2021   Breast mass 05/17/2016   BIRADS 3: biopsy revealed benign fibroadenoma   COPD    CORONARY ARTERY DISEASE  a. CABG (LIMA-->LAD 2001). b. Myoview 2008 no ischemia, EF 63%.  c. cath 10/14/2013 mild 20% ost LAD, atretic LIMA, EF 55%, medical therapy   Family history of leukemia    Family history of ovarian cancer    Family history of prostate cancer     Hematemesis 09/2018   Hyperlipidemia    MENORRHAGIA    Unspecified essential hypertension    Past Surgical History:  Procedure Laterality Date   arthroscopic knee surgery     BIOPSY  09/23/2018   Procedure: BIOPSY;  Surgeon: Juanita Craver, MD;  Location: [facility];  Service: Endoscopy;;   BREAST BIOPSY Right 05/28/2016   HYALINIZED FIBROADENOMA   BREAST LUMPECTOMY WITH RADIOACTIVE SEED AND SENTINEL LYMPH NODE BIOPSY Right 06/06/2021   Procedure: RIGHT BREAST LUMPECTOMY WITH RADIOACTIVE SEED AND SENTINEL LYMPH NODE BIOPSY;  Surgeon: Jovita Kussmaul, MD;  Location: [facility] [facility];  Service: General;  Laterality: Right;   CESAREAN SECTION     CORONARY ARTERY BYPASS GRAFT  2001   ESOPHAGOGASTRODUODENOSCOPY (EGD) WITH PROPOFOL Left 09/23/2018   Procedure: ESOPHAGOGASTRODUODENOSCOPY (EGD) WITH PROPOFOL;  Surgeon: Juanita Craver, MD;  Location: [facility] ENDOSCOPY;  Service: Endoscopy;  Laterality: Left;   LEFT HEART CATH AND CORONARY ANGIOGRAPHY N/A 01/17/2017   Procedure: LEFT HEART CATH AND CORONARY ANGIOGRAPHY;  Surgeon: Belva Crome, MD;  Location: [place] CV LAB;  Service: Cardiovascular;  Laterality: N/A;   LEFT HEART CATHETERIZATION WITH CORONARY/GRAFT ANGIOGRAM N/A 10/12/2013   Procedure: LEFT HEART CATHETERIZATION WITH Beatrix Fetters;  Surgeon: Troy Sine, MD;  Location: [facility] CATH LAB;  Service: Cardiovascular;  Laterality: N/A;     ALLERGIES:  Allergies  Allergen Reactions   Simvastatin Other (See Comments)    REACTION: constipation     CURRENT MEDICATIONS:  Outpatient Encounter Medications as of 11/19/2021  Medication Sig Note   ALPRAZolam (XANAX) 0.25 MG tablet Take 1 tablet (0.25 mg total) by mouth daily as needed for anxiety.    amLODipine (NORVASC) 5 MG tablet Take 1 tablet by mouth once daily    aspirin 81 MG chewable tablet Chew 1 tablet (81 mg total) by mouth daily.    atenolol (TENORMIN) 12.5 mg TABS tablet Atenolol    celecoxib (CELEBREX) 100  MG capsule TAKE 1 CAPSULE BY MOUTH TWICE DAILY AS NEEDED FOR JOINT PAIN    HYDROcodone-acetaminophen (NORCO/VICODIN) 5-325 MG tablet Take 1 tablet by mouth every 12 (twelve) hours as needed for up to 30 doses for severe pain or moderate pain.    letrozole (FEMARA) 2.5 MG tablet Take 1 tablet (2.5 mg total) by mouth daily.    Multiple Vitamins-Minerals (MULTIVITAMIN WITH MINERALS) tablet Take 1 tablet by mouth daily.    naloxone (NARCAN) nasal spray 4 mg/0.1 mL One spray in nostril as needed for overdose - repeat in 4 minutes as needed until help arrives    nitroGLYCERIN (NITROSTAT) 0.4 MG SL tablet Place 1 tablet (0.4 mg total) under the tongue every 5 (five) minutes x 3 doses as needed for chest pain. 05/24/2021: Haven't had in a year   ondansetron (ZOFRAN-ODT) 4 MG disintegrating tablet Take 4 mg by mouth every 8 (eight) hours as needed.    traMADol (ULTRAM) 50 MG tablet TAKE 1 TABLET BY MOUTH EVERY DAY AT BEDTIME DO  NOT  MIX  WITH  XANAX    [DISCONTINUED] HYDROcodone-acetaminophen (NORCO/VICODIN) 5-325 MG tablet Take 1 tablet by mouth every 12 (twelve) hours as needed for up to 30 doses for severe pain or moderate pain.    [  DISCONTINUED] HYDROcodone-acetaminophen (NORCO/VICODIN) 5-325 MG tablet Take 1 tablet by mouth every 12 (twelve) hours as needed for up to 30 doses. To fill in 30 days    [DISCONTINUED] lidocaine (LIDODERM) 5 % Place 1 patch onto the skin daily. Remove & Discard patch within 12 hours.  Use as needed for pain.  Do not apply to skin under radiation treatment    [DISCONTINUED] oxycodone (OXY-IR) 5 MG capsule Take 1 capsule (5 mg total) by mouth every 6 (six) hours as needed.    [DISCONTINUED] rosuvastatin (CRESTOR) 20 MG tablet Take 1 tablet (20 mg total) by mouth daily.    No facility-administered encounter medications on file as of 11/19/2021.     ONCOLOGIC FAMILY HISTORY:  Family History  Problem Relation Age of Onset   Heart disease Mother    Cancer Mother        possibly  ovarian cancer   Lung cancer Father    Heart disease Sister    Ovarian cancer Sister    Cancer - Prostate Brother 11   Leukemia Niece        great niece died of leukemia at 12     SOCIAL HISTORY:  Social History   Socioeconomic History   Marital status: Married    Spouse name: Not on file   Number of children: Not on file   Years of education: Not on file   Highest education level: Not on file  Occupational History   Not on file  Tobacco Use   Smoking status: Former    Types: Cigarettes    Quit date: 1980    Years since quitting: 43.7   Smokeless tobacco: Never   Tobacco comments:    QUIT SMOKING IN 1999  Vaping Use   Vaping Use: Never used  Substance and Sexual Activity   Alcohol use: Yes    Comment: friday nights - 2 glasses - rare   Drug use: No    Comment:  quit using in 1985   Sexual activity: Yes    Partners: Male  Other Topics Concern   Not on file  Social History Narrative   Marital status: married   Children: 3 chidlren and 16 grandchildren and 1 great grandchild   Lives with: husband   Employment: customer service Public house manager   Tobacco:  quit   Alcohol:  Friday nights   Drugs:  none   Exercise:  no   Seatbelt: 100%   Guns in home: rifle       Secured: yes   Social Determinants of Health   Financial Resource Strain: Not on file  Food Insecurity: Not on file  Transportation Needs: Not on file  Physical Activity: Not on file  Stress: Not on file  Social Connections: Not on file  Intimate Partner Violence: Not At Risk (05/08/2021)   Humiliation, Afraid, Rape, and Kick questionnaire    Fear of Current or Ex-Partner: No    Emotionally Abused: No    Physically Abused: No    Sexually Abused: No     OBSERVATIONS/OBJECTIVE:  BP 131/85 (BP Location: Right Arm, Patient Position: Sitting)   Pulse 96   Temp 98.3 F (36.8 C) (Tympanic)   Resp 18   Ht 5' 2" (1.575 m)   Wt 130 lb 12.8 oz (59.3 kg)   SpO2 98%   BMI 23.92 kg/m  GENERAL: Patient  is a well appearing female in no acute distress HEENT:  Sclerae anicteric.  Oropharynx clear and moist. No ulcerations or evidence  of oropharyngeal candidiasis. Neck is supple.  NODES:  No cervical, supraclavicular, or axillary lymphadenopathy palpated.  BREAST EXAM: Left breast is benign right breast with a 1 cm nodule around 1:00 about 5 cm from the nipple.  Otherwise the breast is status postlumpectomy and radiation. LUNGS:  Clear to auscultation bilaterally.  No wheezes or rhonchi. HEART:  Regular rate and rhythm. No murmur appreciated. ABDOMEN:  Soft, nontender.  Positive, normoactive bowel sounds. No organomegaly palpated. MSK:  No focal spinal tenderness to palpation. Full range of motion bilaterally in the upper extremities. EXTREMITIES:  No peripheral edema.   SKIN:  Clear with no obvious rashes or skin changes. No nail dyscrasia. NEURO:  Nonfocal. Well oriented.  Appropriate affect.   LABORATORY DATA:  None for this visit.  DIAGNOSTIC IMAGING:  None for this visit.      ASSESSMENT AND PLAN:  Ms.. Huerta is a pleasant 66 y.o. female with Stage 1A right breast invasive ductal carcinoma, ER+/PR+/HER2-, diagnosed in March 2023, treated with lumpectomy, adjuvant radiation therapy, and anti-estrogen therapy with letrozole beginning in July 2023.  She presents to the Survivorship Clinic for our initial meeting and routine follow-up post-completion of treatment for breast cancer.    1. Stage 1A right breast cancer:  Casey Huerta is continuing to recover from definitive treatment for breast cancer. She will follow-up with her medical oncologist, Dr. Lindi Huerta in 6 months with history and physical exam per surveillance protocol.  She will continue her anti-estrogen therapy with letrozole. Thus far, she is tolerating the letrozole well, with minimal side effects. She was instructed to make Dr. Lindi Huerta or myself aware if she begins to experience any worsening side effects of the medication and I  could see her back in clinic to help manage those side effects, as needed. Her mammogram is due January 2024; orders placed today.   Today, a comprehensive survivorship care plan and treatment summary was reviewed with the patient today detailing her breast cancer diagnosis, treatment course, potential late/long-term effects of treatment, appropriate follow-up care with recommendations for the future, and patient education resources.  A copy of this summary, along with a letter will be sent to the patient's primary care provider via mail/fax/In Basket message after today's visit.    2.  Breast nodule: I placed orders for diagnostic mammogram and ultrasound to be completed within the week and asked my nurse Hassan Rowan to call and get this arranged for the patient.  3. Bone health:  Given Casey Huerta's age/history of breast cancer and her current treatment regimen including anti-estrogen therapy with letrozole, she is at risk for bone demineralization.  She has not undergone bone density testing before, therefore I have placed orders for this to take place in the next few weeks.  She was given education on specific activities to promote bone health.  4. Cancer screening:  Due to Casey Huerta's history and her age, she should receive screening for skin cancers, colon cancer, and gynecologic cancers.  The information and recommendations are listed on the patient's comprehensive care plan/treatment summary and were reviewed in detail with the patient.    5. Health maintenance and wellness promotion: Casey Huerta was encouraged to consume 5-7 servings of fruits and vegetables per day. We reviewed the "Nutrition Rainbow" handout,.  She was also encouraged to engage in moderate to vigorous exercise for 30 minutes per day most days of the week. We discussed the [employer] fitness program, which is designed for cancer survivors to help them become more  physically fit after cancer treatments.  She was instructed to  limit her alcohol consumption and continue to abstain from tobacco use.     6. Support services/counseling: It is not uncommon for this period of the patient's cancer care trajectory to be one of many emotions and stressors. She was given information regarding our available services and encouraged to contact me with any questions or for help enrolling in any of our support group/programs.    Follow up instructions:    -Return to cancer center in 6 months -Right breast diagnostic mammogram and ultrasound within the next week -Mammogram due in January 2024 -She is welcome to return back to the Survivorship Clinic at any time; no additional follow-up needed at this time.  -Consider referral back to survivorship as a long-term survivor for continued surveillance  The patient was provided an opportunity to ask questions and all were answered. The patient agreed with the plan and demonstrated an understanding of the instructions.   Total encounter time:45 minutes*in face-to-face visit time, chart review, lab review, care coordination, order entry, and documentation of the encounter time.    Wilber Bihari, NP 11/19/21 2:31 PM Medical Oncology and Hematology [facility] [place], [place] [address] Tel. [phone]    Fax. [phone]  *Total Encounter Time as defined by the Centers for Medicare and Medicaid Services includes, in addition to the face-to-face time of a patient visit (documented in the note above) non-face-to-face time: obtaining and reviewing outside history, ordering and reviewing medications, tests or procedures, care coordination (communications with other health care professionals or caregivers) and documentation in the medical record.

## 2021-11-19 NOTE — Telephone Encounter (Signed)
Okay for refill?  

## 2021-11-19 NOTE — Telephone Encounter (Signed)
Called and given appt at the breast center on 10/10 for mammogram/ ultrasound at 1:20 pm, arrive at 1 pm. She verbalized understanding to appts.

## 2021-11-27 ENCOUNTER — Ambulatory Visit
Admission: RE | Admit: 2021-11-27 | Discharge: 2021-11-27 | Disposition: A | Discharge status: Home / Self Care | Payer: Medicare HMO | Source: Ambulatory Visit | Attending: Adult Health | Admitting: Adult Health

## 2021-11-27 DIAGNOSIS — Z170 Estrogen receptor positive status [ER+]: Secondary | ICD-10-CM | POA: Diagnosis not present

## 2021-11-27 DIAGNOSIS — C50411 Malignant neoplasm of upper-outer quadrant of right female breast: Secondary | ICD-10-CM | POA: Diagnosis not present

## 2021-11-27 DIAGNOSIS — N6312 Unspecified lump in the right breast, upper inner quadrant: Secondary | ICD-10-CM | POA: Diagnosis not present

## 2021-11-27 DIAGNOSIS — R92331 Mammographic heterogeneous density, right breast: Secondary | ICD-10-CM | POA: Diagnosis not present

## 2021-11-28 ENCOUNTER — Ambulatory Visit (HOSPITAL_BASED_OUTPATIENT_CLINIC_OR_DEPARTMENT_OTHER)
Admission: RE | Admit: 2021-11-28 | Discharge: 2021-11-28 | Disposition: A | Discharge status: Home / Self Care | Payer: Medicare HMO | Source: Ambulatory Visit | Attending: Adult Health | Admitting: Adult Health

## 2021-11-28 DIAGNOSIS — E2839 Other primary ovarian failure: Secondary | ICD-10-CM | POA: Insufficient documentation

## 2021-11-28 DIAGNOSIS — M8589 Other specified disorders of bone density and structure, multiple sites: Secondary | ICD-10-CM | POA: Diagnosis not present

## 2021-12-12 DIAGNOSIS — H524 Presbyopia: Secondary | ICD-10-CM | POA: Diagnosis not present

## 2021-12-15 ENCOUNTER — Encounter: Payer: Self-pay | Admitting: Family Medicine

## 2021-12-16 ENCOUNTER — Encounter: Payer: Self-pay | Admitting: Family Medicine

## 2021-12-18 ENCOUNTER — Encounter: Payer: Self-pay | Admitting: Family Medicine

## 2021-12-18 DIAGNOSIS — M25562 Pain in left knee: Secondary | ICD-10-CM

## 2021-12-18 DIAGNOSIS — G8929 Other chronic pain: Secondary | ICD-10-CM

## 2021-12-18 DIAGNOSIS — M25412 Effusion, left shoulder: Secondary | ICD-10-CM

## 2021-12-18 MED ORDER — HYDROCODONE-ACETAMINOPHEN 5-325 MG PO TABS: 1.0000 | ORAL_TABLET | Freq: Two times a day (BID) | ORAL | 0 refills | Status: DC | PRN

## 2022-01-16 ENCOUNTER — Encounter: Payer: Self-pay | Admitting: Family Medicine

## 2022-01-16 DIAGNOSIS — M25562 Pain in left knee: Secondary | ICD-10-CM

## 2022-01-16 DIAGNOSIS — M25412 Effusion, left shoulder: Secondary | ICD-10-CM

## 2022-01-16 DIAGNOSIS — G8929 Other chronic pain: Secondary | ICD-10-CM

## 2022-01-16 MED ORDER — TRAMADOL HCL 50 MG PO TABS: ORAL_TABLET | ORAL | 3 refills | Status: DC

## 2022-01-16 MED ORDER — HYDROCODONE-ACETAMINOPHEN 5-325 MG PO TABS: 1.0000 | ORAL_TABLET | Freq: Two times a day (BID) | ORAL | 0 refills | Status: DC | PRN

## 2022-01-18 ENCOUNTER — Encounter: Payer: Self-pay | Admitting: Family Medicine

## 2022-02-11 ENCOUNTER — Encounter: Payer: Self-pay | Admitting: Family Medicine

## 2022-02-11 DIAGNOSIS — M25562 Pain in left knee: Secondary | ICD-10-CM

## 2022-02-11 DIAGNOSIS — M545 Low back pain, unspecified: Secondary | ICD-10-CM

## 2022-02-11 DIAGNOSIS — M25412 Effusion, left shoulder: Secondary | ICD-10-CM

## 2022-02-12 MED ORDER — HYDROCODONE-ACETAMINOPHEN 5-325 MG PO TABS: 1.0000 | ORAL_TABLET | Freq: Two times a day (BID) | ORAL | 0 refills | Status: DC | PRN

## 2022-03-13 ENCOUNTER — Encounter: Payer: Self-pay | Admitting: Family Medicine

## 2022-03-13 DIAGNOSIS — G8929 Other chronic pain: Secondary | ICD-10-CM

## 2022-03-13 DIAGNOSIS — M25562 Pain in left knee: Secondary | ICD-10-CM

## 2022-03-13 DIAGNOSIS — M25512 Pain in left shoulder: Secondary | ICD-10-CM

## 2022-03-13 MED ORDER — HYDROCODONE-ACETAMINOPHEN 5-325 MG PO TABS: 1.0000 | ORAL_TABLET | Freq: Two times a day (BID) | ORAL | 0 refills | Status: DC | PRN

## 2022-03-21 ENCOUNTER — Ambulatory Visit
Admission: RE | Admit: 2022-03-21 | Discharge: 2022-03-21 | Disposition: A | Discharge status: Home / Self Care | Payer: Medicare PPO | Source: Ambulatory Visit | Attending: Adult Health | Admitting: Adult Health

## 2022-03-21 DIAGNOSIS — R922 Inconclusive mammogram: Secondary | ICD-10-CM | POA: Diagnosis not present

## 2022-03-21 DIAGNOSIS — Z853 Personal history of malignant neoplasm of breast: Secondary | ICD-10-CM | POA: Diagnosis not present

## 2022-03-21 DIAGNOSIS — Z170 Estrogen receptor positive status [ER+]: Secondary | ICD-10-CM

## 2022-03-22 NOTE — Progress Notes (Unsigned)
[facility], [place], [place] [address] [phone]  Date:  03/27/2022   Name:  CHRISHANA SPARGUR   DOB:  1955/06/26   MRN:  [id number]  PCP:  Darreld Mclean, MD    Chief Complaint: Pain and Follow-up   History of Present Illness:  PHELAN GOERS is a 67 y.o. very pleasant female patient who presents with the following:  Pt seen today for a follow-up visit  Last seen by myself in July of last year  She was dx with breast cancer last year  Treated with letrozole for 7 years - she notes some side effects, mostly fatigue  I am treating her chronic joint pain with Celebrex, hydrocodone 5 #30/month, tramadol 50 number 30/month  Her joint pains are stable, she continues to use her medication as prescribed.  Pap 7/23 UDS needs to be updated  I offered to do labs today, she would prefer to do at her next visit  Patient Active Problem List   Diagnosis Date Noted   Family history of prostate cancer 05/23/2021   Family history of ovarian cancer 05/23/2021   Family history of leukemia 05/23/2021   Malignant neoplasm of upper-outer quadrant of right breast in female, estrogen receptor positive ([place]) 05/03/2021   Prediabetes 02/28/2020   Osteoarthritis of both knees 06/14/2016   Breast mass 05/17/2016   Aortic insufficiency 03/06/2012   Dyslipidemia 06/19/2007   Essential hypertension 06/19/2007   Hx of CABG 06/19/2007    Past Medical History:  Diagnosis Date   Aortic insufficiency    a. Mild-mod AI by echo 2010.   Arthritis    Breast cancer ([place]) 04/25/2021   Breast mass 05/17/2016   BIRADS 3: biopsy revealed benign fibroadenoma   COPD    CORONARY ARTERY DISEASE    a. CABG (LIMA-->LAD 2001). b. Myoview 2008 no ischemia, EF 63%.  c. cath 10/14/2013 mild 20% ost LAD, atretic LIMA, EF 55%, medical therapy   Family history of leukemia    Family history of ovarian cancer    Family history of  prostate cancer    Hematemesis 09/2018   Hyperlipidemia    MENORRHAGIA    Unspecified essential hypertension     Past Surgical History:  Procedure Laterality Date   arthroscopic knee surgery     BIOPSY  09/23/2018   Procedure: BIOPSY;  Surgeon: Juanita Craver, MD;  Location: [facility];  Service: Endoscopy;;   BREAST BIOPSY Right 05/28/2016   HYALINIZED FIBROADENOMA   BREAST LUMPECTOMY WITH RADIOACTIVE SEED AND SENTINEL LYMPH NODE BIOPSY Right 06/06/2021   Procedure: RIGHT BREAST LUMPECTOMY WITH RADIOACTIVE SEED AND SENTINEL LYMPH NODE BIOPSY;  Surgeon: Jovita Kussmaul, MD;  Location: [facility];  Service: General;  Laterality: Right;   CESAREAN SECTION     CORONARY ARTERY BYPASS GRAFT  2001   ESOPHAGOGASTRODUODENOSCOPY (EGD) WITH PROPOFOL Left 09/23/2018   Procedure: ESOPHAGOGASTRODUODENOSCOPY (EGD) WITH PROPOFOL;  Surgeon: Juanita Craver, MD;  Location: [facility] ENDOSCOPY;  Service: Endoscopy;  Laterality: Left;   LEFT HEART CATH AND CORONARY ANGIOGRAPHY N/A 01/17/2017   Procedure: LEFT HEART CATH AND CORONARY ANGIOGRAPHY;  Surgeon: Belva Crome, MD;  Location: [place] CV LAB;  Service: Cardiovascular;  Laterality: N/A;   LEFT HEART CATHETERIZATION WITH CORONARY/GRAFT ANGIOGRAM N/A 10/12/2013   Procedure: LEFT HEART CATHETERIZATION WITH Beatrix Fetters;  Surgeon: Troy Sine, MD;  Location: [facility] CATH LAB;  Service: Cardiovascular;  Laterality: N/A;  Social History   Tobacco Use   Smoking status: Former    Types: Cigarettes    Quit date: 1980    Years since quitting: 44.1   Smokeless tobacco: Never   Tobacco comments:    QUIT SMOKING IN 1999  Vaping Use   Vaping Use: Never used  Substance Use Topics   Alcohol use: Yes    Comment: friday nights - 2 glasses - rare   Drug use: No    Comment:  quit using in 1985    Family History  Problem Relation Age of Onset   Heart disease Mother    Cancer Mother        possibly ovarian cancer   Lung cancer  Father    Heart disease Sister    Ovarian cancer Sister    Cancer - Prostate Brother 68   Leukemia Niece        great niece died of leukemia at 16    Allergies  Allergen Reactions   Simvastatin Other (See Comments)    REACTION: constipation    Medication list has been reviewed and updated.  Current Outpatient Medications on File Prior to Visit  Medication Sig Dispense Refill   ALPRAZolam (XANAX) 0.25 MG tablet Take 1 tablet (0.25 mg total) by mouth daily as needed for anxiety. 30 tablet 3   amLODipine (NORVASC) 5 MG tablet Take 1 tablet by mouth once daily 90 tablet 1   aspirin 81 MG chewable tablet Chew 1 tablet (81 mg total) by mouth daily. 90 tablet 4   atenolol (TENORMIN) 12.5 mg TABS tablet Atenolol     celecoxib (CELEBREX) 100 MG capsule TAKE 1 CAPSULE BY MOUTH TWICE DAILY AS NEEDED FOR JOINT PAIN 180 capsule 3   HYDROcodone-acetaminophen (NORCO/VICODIN) 5-325 MG tablet Take 1 tablet by mouth every 12 (twelve) hours as needed for up to 30 doses for severe pain or moderate pain. 30 tablet 0   letrozole (FEMARA) 2.5 MG tablet Take 1 tablet (2.5 mg total) by mouth daily. 90 tablet 3   Multiple Vitamins-Minerals (MULTIVITAMIN WITH MINERALS) tablet Take 1 tablet by mouth daily.     naloxone (NARCAN) nasal spray 4 mg/0.1 mL One spray in nostril as needed for overdose - repeat in 4 minutes as needed until help arrives 1 each PRN   ondansetron (ZOFRAN-ODT) 4 MG disintegrating tablet Take 4 mg by mouth every 8 (eight) hours as needed.     traMADol (ULTRAM) 50 MG tablet TAKE 1 TABLET BY MOUTH EVERY DAY AT BEDTIME DO  NOT  MIX  WITH  XANAX 30 tablet 3   nitroGLYCERIN (NITROSTAT) 0.4 MG SL tablet Place 1 tablet (0.4 mg total) under the tongue every 5 (five) minutes x 3 doses as needed for chest pain. (Patient not taking: Reported on 03/27/2022) 25 tablet 1   No current facility-administered medications on file prior to visit.    Review of Systems:  As per HPI- otherwise  negative.   Physical Examination: Vitals:   03/27/22 1414  BP: 132/82  Pulse: 60  Resp: 16  Temp: 98 F (36.7 C)  SpO2: 99%   Vitals:   03/27/22 1414  Weight: 130 lb 9.6 oz (59.2 kg)  Height: '5\' 2"'$  (1.575 m)   Body mass index is 23.89 kg/m. Ideal Body Weight: Weight in (lb) to have BMI = 25: 136.4  GEN: no acute distress.  Normal weight, looks well HEENT: Atraumatic, Normocephalic.  Ears and Nose: No external deformity. CV: RRR, No M/G/R. No JVD. No  thrill. No extra heart sounds. PULM: CTA B, no wheezes, crackles, rhonchi. No retractions. No resp. distress. No accessory muscle use. ABD: S, NT, ND EXTR: No c/c/e PSYCH: Normally interactive. Conversant.    Assessment and Plan: Medication monitoring encounter - Plan: DRUG MONITORING, PANEL 8 WITH CONFIRMATION, URINE  Essential hypertension - Plan: amLODipine (NORVASC) 5 MG tablet  Pure hypercholesterolemia - Plan: rosuvastatin (CRESTOR) 20 MG tablet  Following up today for medication check.  UDS is pending Blood pressure well-controlled on current regimen, refilled amlodipine, continue atenolol Refill Crestor.  I offered a CT coronary calcium, she would like to think about this Plan to recheck in 6 months assuming all is well  Signed Lamar Blinks, MD

## 2022-03-27 ENCOUNTER — Ambulatory Visit (INDEPENDENT_AMBULATORY_CARE_PROVIDER_SITE_OTHER): Payer: Medicare PPO | Admitting: Family Medicine

## 2022-03-27 VITALS — BP 132/82 | HR 60 | Temp 98.0°F | Resp 16 | Ht 62.0 in | Wt 130.6 lb

## 2022-03-27 DIAGNOSIS — Z5181 Encounter for therapeutic drug level monitoring: Secondary | ICD-10-CM | POA: Diagnosis not present

## 2022-03-27 DIAGNOSIS — I10 Essential (primary) hypertension: Secondary | ICD-10-CM | POA: Diagnosis not present

## 2022-03-27 DIAGNOSIS — E7800 Pure hypercholesterolemia, unspecified: Secondary | ICD-10-CM | POA: Diagnosis not present

## 2022-03-27 MED ORDER — AMLODIPINE BESYLATE 5 MG PO TABS: 5.0000 mg | ORAL_TABLET | Freq: Every day | ORAL | 3 refills | Status: DC

## 2022-03-27 MED ORDER — ROSUVASTATIN CALCIUM 20 MG PO TABS: 20.0000 mg | ORAL_TABLET | Freq: Every day | ORAL | 3 refills | Status: DC

## 2022-03-27 NOTE — Patient Instructions (Signed)
It was good to see you again today, please see me in about 6 months for routine checkup and labs. I refilled your cholesterol medicine and amlodipine today Let me know when you need other refills I am happy to order a coronary calcium for you at any point if you are interested

## 2022-03-29 LAB — DRUG MONITORING, PANEL 8 WITH CONFIRMATION, URINE
6 Acetylmorphine: NEGATIVE ng/mL (ref <10)
Alcohol Metabolites: NEGATIVE ng/mL (ref <500)
Amphetamines: NEGATIVE ng/mL (ref <500)
Benzodiazepines: NEGATIVE ng/mL (ref <100)
Buprenorphine, Urine: NEGATIVE ng/mL (ref <5)
Cocaine Metabolite: NEGATIVE ng/mL (ref <150)
Codeine: NEGATIVE ng/mL (ref <50)
Creatinine: 62.5 mg/dL (ref >=20.0)
Hydrocodone: 591 ng/mL — ABNORMAL HIGH (ref <50)
Hydromorphone: 196 ng/mL — ABNORMAL HIGH (ref <50)
MDMA: NEGATIVE ng/mL (ref <500)
Marijuana Metabolite: NEGATIVE ng/mL (ref <20)
Morphine: NEGATIVE ng/mL (ref <50)
Norhydrocodone: 533 ng/mL — ABNORMAL HIGH (ref <50)
Opiates: POSITIVE ng/mL — AB (ref <100)
Oxidant: NEGATIVE ug/mL (ref <200)
Oxycodone: NEGATIVE ng/mL (ref <100)
pH: 5.7 (ref 4.5–9.0)

## 2022-03-29 LAB — DM TEMPLATE

## 2022-04-11 ENCOUNTER — Encounter: Payer: Self-pay | Admitting: Family Medicine

## 2022-04-11 DIAGNOSIS — G8929 Other chronic pain: Secondary | ICD-10-CM

## 2022-04-11 DIAGNOSIS — M25512 Pain in left shoulder: Secondary | ICD-10-CM

## 2022-04-11 DIAGNOSIS — M25562 Pain in left knee: Secondary | ICD-10-CM

## 2022-04-11 MED ORDER — HYDROCODONE-ACETAMINOPHEN 5-325 MG PO TABS: 1.0000 | ORAL_TABLET | Freq: Two times a day (BID) | ORAL | 0 refills | Status: DC | PRN

## 2022-05-01 ENCOUNTER — Telehealth: Payer: Self-pay | Admitting: Hematology and Oncology

## 2022-05-01 NOTE — Telephone Encounter (Signed)
Rescheduled appointment per provider [facility]. Left voicemail. 

## 2022-05-10 ENCOUNTER — Encounter: Payer: Self-pay | Admitting: Family Medicine

## 2022-05-10 DIAGNOSIS — M545 Low back pain, unspecified: Secondary | ICD-10-CM

## 2022-05-10 DIAGNOSIS — M25412 Effusion, left shoulder: Secondary | ICD-10-CM

## 2022-05-10 DIAGNOSIS — M25562 Pain in left knee: Secondary | ICD-10-CM

## 2022-05-10 MED ORDER — HYDROCODONE-ACETAMINOPHEN 5-325 MG PO TABS: 1.0000 | ORAL_TABLET | Freq: Two times a day (BID) | ORAL | 0 refills | Status: DC | PRN

## 2022-05-10 NOTE — Telephone Encounter (Signed)
Requesting: hydrocodone 5-325mg   Contract: None UDS: 03/27/22 Last Visit: 08/29/21 Next Visit: None Last Refill: 04/11/22 #30 and 0RF   Please Advise

## 2022-05-10 NOTE — Telephone Encounter (Signed)
PDMP okay, Rx sent 

## 2022-05-22 ENCOUNTER — Ambulatory Visit: Payer: Medicare HMO | Admitting: Hematology and Oncology

## 2022-05-24 ENCOUNTER — Encounter: Payer: Self-pay | Admitting: Family Medicine

## 2022-05-24 DIAGNOSIS — M25562 Pain in left knee: Secondary | ICD-10-CM

## 2022-05-24 DIAGNOSIS — G8929 Other chronic pain: Secondary | ICD-10-CM

## 2022-05-24 MED ORDER — TRAMADOL HCL 50 MG PO TABS: ORAL_TABLET | ORAL | 3 refills | Status: DC

## 2022-05-29 NOTE — Progress Notes (Signed)
Patient Care Team: Copland, Gwenlyn Found, MD as PCP - General (Family Medicine) Jens Som Madolyn Frieze, MD as PCP - Cardiology (Cardiology) Jens Som Madolyn Frieze, MD as Consulting Physician (Cardiology) Dorothy Puffer, MD as Consulting Physician (Radiation Oncology) Serena Croissant, MD as Consulting Physician (Hematology and Oncology) Griselda Miner, MD as Consulting Physician (General Surgery)  DIAGNOSIS: No diagnosis found.  SUMMARY OF ONCOLOGIC HISTORY: Oncology History  Malignant neoplasm of upper-outer quadrant of right breast in female, estrogen receptor positive  04/25/2021 Initial Diagnosis   Screening mammogram detected right breast mass, by ultrasound measured 9 mm, biopsy revealed grade 1 IDC with low-grade DCIS, ER 95%, PR 90%, Ki-67 10%, HER2 2+ by IHC, FISH negative, ratio 1.17, copy #1.7   05/07/2021 Cancer Staging   Staging form: Breast, AJCC 8th Edition - Clinical stage from 05/07/2021: Stage IA (cT1b, cN0, cM0, G1, ER+, PR+, HER2-) - Signed by Serena Croissant, MD on 05/17/2021 Stage prefix: Initial diagnosis Method of lymph node assessment: Clinical Histologic grading system: 3 grade system   06/06/2021 Surgery   06/06/2021: Right lumpectomy: Grade 2 IDC 1.2 cm, margins negative, 0/4 lymph nodes negative, ER 95%, PR 90%, HER2 negative, Ki-67 10%   06/19/2021 Oncotype testing   Score 9 (ROR 3%)   07/04/2021 Cancer Staging   Staging form: Breast, AJCC 8th Edition - Pathologic stage from 07/04/2021: Stage IA (pT1c, pN0(sn), cM0, G2, ER+, PR+, HER2-, Oncotype DX score: 9) - Signed by Ronny Bacon, PA-C on 07/04/2021 Stage prefix: Initial diagnosis Method of lymph node assessment: Sentinel lymph node biopsy Multigene prognostic tests performed: Oncotype DX Recurrence score range: Less than 11 Histologic grading system: 3 grade system   07/13/2021 - 08/09/2021 Radiation Therapy   Site Technique Total Dose (Gy) Dose per Fx (Gy) Completed Fx Beam Energies  Breast, Right: Breast_R 3D  42.56/42.56 2.66 16/16 6X  Breast, Right: Breast_R_Bst 3D 8/8 2 4/4 6X, 10X       CHIEF COMPLIANT:   INTERVAL HISTORY: Casey Huerta is a   ALLERGIES:  is allergic to simvastatin.  MEDICATIONS:  Current Outpatient Medications  Medication Sig Dispense Refill   ALPRAZolam (XANAX) 0.25 MG tablet Take 1 tablet (0.25 mg total) by mouth daily as needed for anxiety. 30 tablet 3   amLODipine (NORVASC) 5 MG tablet Take 1 tablet (5 mg total) by mouth daily. 90 tablet 3   aspirin 81 MG chewable tablet Chew 1 tablet (81 mg total) by mouth daily. 90 tablet 4   atenolol (TENORMIN) 12.5 mg TABS tablet Atenolol     celecoxib (CELEBREX) 100 MG capsule TAKE 1 CAPSULE BY MOUTH TWICE DAILY AS NEEDED FOR JOINT PAIN 180 capsule 3   HYDROcodone-acetaminophen (NORCO/VICODIN) 5-325 MG tablet Take 1 tablet by mouth every 12 (twelve) hours as needed for up to 30 doses for severe pain or moderate pain. 30 tablet 0   letrozole (FEMARA) 2.5 MG tablet Take 1 tablet (2.5 mg total) by mouth daily. 90 tablet 3   Multiple Vitamins-Minerals (MULTIVITAMIN WITH MINERALS) tablet Take 1 tablet by mouth daily.     naloxone (NARCAN) nasal spray 4 mg/0.1 mL One spray in nostril as needed for overdose - repeat in 4 minutes as needed until help arrives 1 each PRN   nitroGLYCERIN (NITROSTAT) 0.4 MG SL tablet Place 1 tablet (0.4 mg total) under the tongue every 5 (five) minutes x 3 doses as needed for chest pain. (Patient not taking: Reported on 03/27/2022) 25 tablet 1   ondansetron (ZOFRAN-ODT) 4 MG disintegrating tablet  Take 4 mg by mouth every 8 (eight) hours as needed.     rosuvastatin (CRESTOR) 20 MG tablet Take 1 tablet (20 mg total) by mouth daily. 90 tablet 3   traMADol (ULTRAM) 50 MG tablet TAKE 1 TABLET BY MOUTH EVERY DAY AT BEDTIME DO  NOT  MIX  WITH  XANAX 30 tablet 3   No current facility-administered medications for this visit.    PHYSICAL EXAMINATION: ECOG PERFORMANCE STATUS: {CHL ONC ECOG  PS:[phone]}  There were no vitals filed for this visit. There were no vitals filed for this visit.  BREAST:*** No palpable masses or nodules in either right or left breasts. No palpable axillary supraclavicular or infraclavicular adenopathy no breast tenderness or nipple discharge. (exam performed in the presence of a chaperone)  LABORATORY DATA:  I have reviewed the data as listed    Latest Ref Rng & Units 08/29/2021    2:38 PM 05/29/2021    1:30 PM 03/05/2021    2:21 PM  CMP  Glucose 70 - 99 mg/dL 66  935  87   BUN 6 - 23 mg/dL 12  11  8    Creatinine 0.40 - 1.20 mg/dL 7.01  7.79  3.90   Sodium 135 - 145 mEq/L 142  139  142   Potassium 3.5 - 5.1 mEq/L 4.9  4.5  4.8   Chloride 96 - 112 mEq/L 105  106  105   CO2 19 - 32 mEq/L 29  27  30    Calcium 8.4 - 10.5 mg/dL 30.0  9.7  92.3   Total Protein 6.0 - 8.3 g/dL 7.4   7.8   Total Bilirubin 0.2 - 1.2 mg/dL 0.4   0.3   Alkaline Phos 39 - 117 U/L 85   93   AST 0 - 37 U/L 23   18   ALT 0 - 35 U/L 19   15     Lab Results  Component Value Date   WBC 4.1 08/29/2021   HGB 13.6 08/29/2021   HCT 42.0 08/29/2021   MCV 92.3 08/29/2021   PLT 217.0 08/29/2021   NEUTROABS 2.0 03/25/2017    ASSESSMENT & PLAN:  No problem-specific Assessment & Plan notes found for this encounter.    No orders of the defined types were placed in this encounter.  The patient has a good understanding of the overall plan. she agrees with it. she will call with any problems that may develop before the next visit here. Total time spent: 30 mins including face to face time and time spent for planning, charting and co-ordination of care   Sherlyn Lick, CMA 05/29/22    I Janan Ridge am acting as a Neurosurgeon for [employer]  ***

## 2022-05-30 ENCOUNTER — Inpatient Hospital Stay: Payer: Medicare PPO | Attending: Hematology and Oncology | Admitting: Hematology and Oncology

## 2022-05-30 VITALS — BP 140/65 | HR 80 | Temp 97.2°F | Resp 18 | Ht 62.0 in | Wt 132.5 lb

## 2022-05-30 DIAGNOSIS — Z170 Estrogen receptor positive status [ER+]: Secondary | ICD-10-CM | POA: Diagnosis not present

## 2022-05-30 DIAGNOSIS — M1990 Unspecified osteoarthritis, unspecified site: Secondary | ICD-10-CM | POA: Insufficient documentation

## 2022-05-30 DIAGNOSIS — Z7982 Long term (current) use of aspirin: Secondary | ICD-10-CM | POA: Insufficient documentation

## 2022-05-30 DIAGNOSIS — Z79811 Long term (current) use of aromatase inhibitors: Secondary | ICD-10-CM | POA: Diagnosis not present

## 2022-05-30 DIAGNOSIS — Z923 Personal history of irradiation: Secondary | ICD-10-CM | POA: Diagnosis not present

## 2022-05-30 DIAGNOSIS — C50411 Malignant neoplasm of upper-outer quadrant of right female breast: Secondary | ICD-10-CM | POA: Insufficient documentation

## 2022-05-30 DIAGNOSIS — Z79899 Other long term (current) drug therapy: Secondary | ICD-10-CM | POA: Insufficient documentation

## 2022-05-30 NOTE — Assessment & Plan Note (Addendum)
04/25/2021:Screening mammogram detected right breast mass, by ultrasound measured 9 mm, biopsy revealed grade 1 IDC with low-grade DCIS, ER 95%, PR 90%, Ki-67 10%, HER2 2+ by IHC, FISH negative, ratio 1.17, copy #1.7   06/06/2021: Right lumpectomy: Grade 2 IDC 1.2 cm, margins negative, 0/4 lymph nodes negative, ER 95%, PR 90%, HER2 negative, Ki-67 10%   Adj XRT 07/13/21-08/09/21 Oncotype Dx: Score 9 (ROR 3%)   Treatment Plan: Adj Anti estrogen therapy with Letrozole 2.5 mg daily   Letrozole toxicities: Tolerating extremely well.  She has chronic osteoarthritis which causes aches and pains as well as occasional hot flashes.  This is her baseline.  Breast cancer surveillance: Breast exam 05/30/2022: Benign Mammogram 03/21/2022: Benign breast density category C  Osteopenia: I discussed with her about calcium and vitamin D and weightbearing exercises. Return to clinic in 1 year for follow-up

## 2022-05-31 ENCOUNTER — Encounter: Payer: Self-pay | Admitting: Family Medicine

## 2022-05-31 ENCOUNTER — Telehealth: Payer: Self-pay | Admitting: Hematology and Oncology

## 2022-05-31 NOTE — Telephone Encounter (Signed)
Contacted patient to scheduled appointments. Patient is aware of appointments that are scheduled.   

## 2022-06-08 ENCOUNTER — Encounter: Payer: Self-pay | Admitting: Family Medicine

## 2022-06-08 DIAGNOSIS — M545 Low back pain, unspecified: Secondary | ICD-10-CM

## 2022-06-08 DIAGNOSIS — M25562 Pain in left knee: Secondary | ICD-10-CM

## 2022-06-08 DIAGNOSIS — M25412 Effusion, left shoulder: Secondary | ICD-10-CM

## 2022-06-09 ENCOUNTER — Encounter: Payer: Self-pay | Admitting: Family Medicine

## 2022-06-09 MED ORDER — HYDROCODONE-ACETAMINOPHEN 5-325 MG PO TABS: 1.0000 | ORAL_TABLET | Freq: Two times a day (BID) | ORAL | 0 refills | Status: DC | PRN

## 2022-06-20 ENCOUNTER — Encounter: Payer: Self-pay | Admitting: Family Medicine

## 2022-06-20 DIAGNOSIS — M25562 Pain in left knee: Secondary | ICD-10-CM

## 2022-06-20 DIAGNOSIS — G8929 Other chronic pain: Secondary | ICD-10-CM

## 2022-06-21 MED ORDER — TRAMADOL HCL 50 MG PO TABS: ORAL_TABLET | ORAL | 3 refills | Status: DC

## 2022-06-21 NOTE — Addendum Note (Signed)
Addended by: Abbe Amsterdam C on: 06/21/2022 03:13 PM   Modules accepted: Orders

## 2022-07-05 ENCOUNTER — Encounter: Payer: Self-pay | Admitting: Family Medicine

## 2022-07-05 DIAGNOSIS — G8929 Other chronic pain: Secondary | ICD-10-CM

## 2022-07-05 DIAGNOSIS — M25412 Effusion, left shoulder: Secondary | ICD-10-CM

## 2022-07-05 DIAGNOSIS — M25562 Pain in left knee: Secondary | ICD-10-CM

## 2022-07-05 MED ORDER — HYDROCODONE-ACETAMINOPHEN 5-325 MG PO TABS: 1.0000 | ORAL_TABLET | Freq: Two times a day (BID) | ORAL | 0 refills | Status: DC | PRN

## 2022-08-01 ENCOUNTER — Other Ambulatory Visit: Payer: Self-pay | Admitting: Hematology and Oncology

## 2022-08-05 ENCOUNTER — Encounter: Payer: Self-pay | Admitting: Family Medicine

## 2022-08-05 DIAGNOSIS — M25412 Effusion, left shoulder: Secondary | ICD-10-CM

## 2022-08-05 DIAGNOSIS — M25562 Pain in left knee: Secondary | ICD-10-CM

## 2022-08-05 DIAGNOSIS — M545 Low back pain, unspecified: Secondary | ICD-10-CM

## 2022-08-05 MED ORDER — HYDROCODONE-ACETAMINOPHEN 5-325 MG PO TABS: 1.0000 | ORAL_TABLET | Freq: Two times a day (BID) | ORAL | 0 refills | Status: DC | PRN

## 2022-09-02 ENCOUNTER — Encounter: Payer: Self-pay | Admitting: Family Medicine

## 2022-09-02 DIAGNOSIS — M25412 Effusion, left shoulder: Secondary | ICD-10-CM

## 2022-09-02 DIAGNOSIS — M25562 Pain in left knee: Secondary | ICD-10-CM

## 2022-09-02 DIAGNOSIS — M545 Low back pain, unspecified: Secondary | ICD-10-CM

## 2022-09-03 MED ORDER — HYDROCODONE-ACETAMINOPHEN 5-325 MG PO TABS: 1.0000 | ORAL_TABLET | Freq: Two times a day (BID) | ORAL | 0 refills | Status: DC | PRN

## 2022-09-16 ENCOUNTER — Other Ambulatory Visit: Payer: Self-pay | Admitting: Family Medicine

## 2022-09-16 ENCOUNTER — Other Ambulatory Visit: Payer: Self-pay

## 2022-10-03 ENCOUNTER — Encounter: Payer: Self-pay | Admitting: Family Medicine

## 2022-10-03 DIAGNOSIS — M25562 Pain in left knee: Secondary | ICD-10-CM

## 2022-10-03 DIAGNOSIS — M25412 Effusion, left shoulder: Secondary | ICD-10-CM

## 2022-10-03 DIAGNOSIS — M545 Low back pain, unspecified: Secondary | ICD-10-CM

## 2022-10-03 MED ORDER — HYDROCODONE-ACETAMINOPHEN 5-325 MG PO TABS: 1.0000 | ORAL_TABLET | Freq: Two times a day (BID) | ORAL | 0 refills | Status: DC | PRN

## 2022-10-14 NOTE — Progress Notes (Unsigned)
[facility] [address] [place], [place] [address] [phone]  Date:  10/16/2022   Name:  Casey Huerta   DOB:  1956/02/12   MRN:  [id number]  PCP:  Pearline Cables, MD    Chief Complaint: No chief complaint on file.   History of Present Illness:  Casey Huerta is a 67 y.o. very pleasant female patient who presents with the following:  Pt seen today for a recheck/ physical exam History of DM, chronic pain, breast cancer, pre-diabetes, CAD- history of CABG 2001 Last visit with myself was in February  Dx with breast cancer 2023. She is taking letrozole for 7 years  I am treating her chronic joint pain with Celebrex, hydrocodone 5 #30/month, tramadol 50  #30/month  Her joint pains are stable, she continues to use her medication as prescribed.  Recommend flu and covid this fall Pneumonia vaccine due Mammo UTD- 2/24 Dexa 10/23 Cologuard UTD  1/23  Patient Active Problem List   Diagnosis Date Noted   Family history of prostate cancer 05/23/2021   Family history of ovarian cancer 05/23/2021   Family history of leukemia 05/23/2021   Malignant neoplasm of upper-outer quadrant of right breast in female, estrogen receptor positive (HCC) 05/03/2021   Prediabetes 02/28/2020   Osteoarthritis of both knees 06/14/2016   Breast mass 05/17/2016   Aortic insufficiency 03/06/2012   Dyslipidemia 06/19/2007   Essential hypertension 06/19/2007   Hx of CABG 06/19/2007    Past Medical History:  Diagnosis Date   Aortic insufficiency    a. Mild-mod AI by echo 2010.   Arthritis    Breast cancer (HCC) 04/25/2021   Breast mass 05/17/2016   BIRADS 3: biopsy revealed benign fibroadenoma   COPD    CORONARY ARTERY DISEASE    a. CABG (LIMA-->LAD 2001). b. Myoview 2008 no ischemia, EF 63%.  c. cath 10/14/2013 mild 20% ost LAD, atretic LIMA, EF 55%, medical therapy   Family history of leukemia    Family history of ovarian  cancer    Family history of prostate cancer    Hematemesis 09/2018   Hyperlipidemia    MENORRHAGIA    Unspecified essential hypertension     Past Surgical History:  Procedure Laterality Date   arthroscopic knee surgery     BIOPSY  09/23/2018   Procedure: BIOPSY;  Surgeon: Charna Elizabeth, MD;  Location: [facility] ENDOSCOPY;  Service: Endoscopy;;   BREAST BIOPSY Right 05/28/2016   HYALINIZED FIBROADENOMA   BREAST LUMPECTOMY WITH RADIOACTIVE SEED AND SENTINEL LYMPH NODE BIOPSY Right 06/06/2021   Procedure: RIGHT BREAST LUMPECTOMY WITH RADIOACTIVE SEED AND SENTINEL LYMPH NODE BIOPSY;  Surgeon: Griselda Miner, MD;  Location: [facility];  Service: General;  Laterality: Right;   CESAREAN SECTION     CORONARY ARTERY BYPASS GRAFT  2001   ESOPHAGOGASTRODUODENOSCOPY (EGD) WITH PROPOFOL Left 09/23/2018   Procedure: ESOPHAGOGASTRODUODENOSCOPY (EGD) WITH PROPOFOL;  Surgeon: Charna Elizabeth, MD;  Location: [facility] ENDOSCOPY;  Service: Endoscopy;  Laterality: Left;   LEFT HEART CATH AND CORONARY ANGIOGRAPHY N/A 01/17/2017   Procedure: LEFT HEART CATH AND CORONARY ANGIOGRAPHY;  Surgeon: Lyn Records, MD;  Location: [facility] INVASIVE CV LAB;  Service: Cardiovascular;  Laterality: N/A;   LEFT HEART CATHETERIZATION WITH CORONARY/GRAFT ANGIOGRAM N/A 10/12/2013   Procedure: LEFT HEART CATHETERIZATION WITH Isabel Caprice;  Surgeon: Lennette Bihari, MD;  Location: [facility] CATH LAB;  Service: Cardiovascular;  Laterality: N/A;    Social  History   Tobacco Use   Smoking status: Former    Current packs/day: 0.00    Types: Cigarettes    Quit date: 1980    Years since quitting: 44.6   Smokeless tobacco: Never   Tobacco comments:    QUIT SMOKING IN 1999  Vaping Use   Vaping status: Never Used  Substance Use Topics   Alcohol use: Yes    Comment: friday nights - 2 glasses - rare   Drug use: No    Comment:  quit using in 1985    Family History  Problem Relation Age of Onset   Heart disease Mother     Cancer Mother        possibly ovarian cancer   Lung cancer Father    Heart disease Sister    Ovarian cancer Sister    Cancer - Prostate Brother 69   Leukemia Niece        great niece died of leukemia at 61    Allergies  Allergen Reactions   Simvastatin Other (See Comments)    REACTION: constipation    Medication list has been reviewed and updated.  Current Outpatient Medications on File Prior to Visit  Medication Sig Dispense Refill   ALPRAZolam (XANAX) 0.25 MG tablet Take 1 tablet (0.25 mg total) by mouth daily as needed for anxiety. 30 tablet 3   amLODipine (NORVASC) 5 MG tablet Take 1 tablet (5 mg total) by mouth daily. 90 tablet 3   atenolol (TENORMIN) 12.5 mg TABS tablet Atenolol     celecoxib (CELEBREX) 100 MG capsule TAKE 1 CAPSULE BY MOUTH TWICE DAILY AS NEEDED FOR JOINT PAIN 180 capsule 3   EQ ASPIRIN LOW DOSE 81 MG chewable tablet CHEW AND SWALLOW 1 TABLET BY MOUTH ONCE DAILY 90 tablet 0   HYDROcodone-acetaminophen (NORCO/VICODIN) 5-325 MG tablet Take 1 tablet by mouth every 12 (twelve) hours as needed for up to 30 doses for severe pain or moderate pain. 30 tablet 0   letrozole (FEMARA) 2.5 MG tablet Take 1 tablet by mouth once daily 90 tablet 0   Multiple Vitamins-Minerals (MULTIVITAMIN WITH MINERALS) tablet Take 1 tablet by mouth daily.     naloxone (NARCAN) nasal spray 4 mg/0.1 mL One spray in nostril as needed for overdose - repeat in 4 minutes as needed until help arrives 1 each PRN   nitroGLYCERIN (NITROSTAT) 0.4 MG SL tablet Place 1 tablet (0.4 mg total) under the tongue every 5 (five) minutes x 3 doses as needed for chest pain. (Patient not taking: Reported on 05/30/2022) 25 tablet 1   ondansetron (ZOFRAN-ODT) 4 MG disintegrating tablet Take 4 mg by mouth every 8 (eight) hours as needed.     rosuvastatin (CRESTOR) 20 MG tablet Take 1 tablet (20 mg total) by mouth daily. 90 tablet 3   traMADol (ULTRAM) 50 MG tablet TAKE 1 TABLET BY MOUTH EVERY DAY AT BEDTIME DO  NOT   MIX  WITH  XANAX 30 tablet 3   No current facility-administered medications on file prior to visit.    Review of Systems:  As per HPI- otherwise negative.   Physical Examination: There were no vitals filed for this visit. There were no vitals filed for this visit. There is no height or weight on file to calculate BMI. Ideal Body Weight:    GEN: no acute distress. HEENT: Atraumatic, Normocephalic.  Ears and Nose: No external deformity. CV: RRR, No M/G/R. No JVD. No thrill. No extra heart sounds. PULM: CTA B, no wheezes,  crackles, rhonchi. No retractions. No resp. distress. No accessory muscle use. ABD: S, NT, ND, +BS. No rebound. No HSM. EXTR: No c/c/e PSYCH: Normally interactive. Conversant.    Assessment and Plan: ***  Signed Abbe Amsterdam, MD

## 2022-10-16 ENCOUNTER — Ambulatory Visit (INDEPENDENT_AMBULATORY_CARE_PROVIDER_SITE_OTHER): Payer: Medicare PPO | Admitting: Family Medicine

## 2022-10-16 VITALS — BP 118/78 | HR 81 | Temp 98.4°F | Resp 18 | Ht 62.0 in | Wt 131.8 lb

## 2022-10-16 DIAGNOSIS — M25561 Pain in right knee: Secondary | ICD-10-CM

## 2022-10-16 DIAGNOSIS — R5383 Other fatigue: Secondary | ICD-10-CM

## 2022-10-16 DIAGNOSIS — Z0000 Encounter for general adult medical examination without abnormal findings: Secondary | ICD-10-CM

## 2022-10-16 DIAGNOSIS — E7800 Pure hypercholesterolemia, unspecified: Secondary | ICD-10-CM

## 2022-10-16 DIAGNOSIS — I10 Essential (primary) hypertension: Secondary | ICD-10-CM | POA: Diagnosis not present

## 2022-10-16 DIAGNOSIS — M545 Low back pain, unspecified: Secondary | ICD-10-CM

## 2022-10-16 DIAGNOSIS — I499 Cardiac arrhythmia, unspecified: Secondary | ICD-10-CM

## 2022-10-16 DIAGNOSIS — G8929 Other chronic pain: Secondary | ICD-10-CM

## 2022-10-16 DIAGNOSIS — Z951 Presence of aortocoronary bypass graft: Secondary | ICD-10-CM

## 2022-10-16 DIAGNOSIS — M25562 Pain in left knee: Secondary | ICD-10-CM

## 2022-10-16 DIAGNOSIS — R7303 Prediabetes: Secondary | ICD-10-CM | POA: Diagnosis not present

## 2022-10-16 DIAGNOSIS — Z23 Encounter for immunization: Secondary | ICD-10-CM

## 2022-10-16 NOTE — Patient Instructions (Addendum)
It was good to see you again today, I will be in touch with your labs soon as possible  You got your pneumonia shot and flu shot today  Recommend a COVID booster shot this fall  Please let me know if any problems or concerns, otherwise lets plan to check back in 6 months

## 2022-10-17 ENCOUNTER — Encounter: Payer: Self-pay | Admitting: Family Medicine

## 2022-10-17 LAB — LDL CHOLESTEROL, DIRECT: Direct LDL: 71 mg/dL

## 2022-10-17 LAB — LIPID PANEL
Cholesterol: 161 mg/dL (ref 0–200)
HDL: 67.2 mg/dL (ref >39.00)
NonHDL: 93.37
Total CHOL/HDL Ratio: 2
Triglycerides: 269 mg/dL — ABNORMAL HIGH (ref 0.0–149.0)
VLDL: 53.8 mg/dL — ABNORMAL HIGH (ref 0.0–40.0)

## 2022-10-17 LAB — COMPREHENSIVE METABOLIC PANEL
ALT: 18 U/L (ref 0–35)
AST: 22 U/L (ref 0–37)
Albumin: 4.1 g/dL (ref 3.5–5.2)
Alkaline Phosphatase: 81 U/L (ref 39–117)
BUN: 9 mg/dL (ref 6–23)
CO2: 29 meq/L (ref 19–32)
Calcium: 10.4 mg/dL (ref 8.4–10.5)
Chloride: 105 meq/L (ref 96–112)
Creatinine, Ser: 0.89 mg/dL (ref 0.40–1.20)
GFR: 67.74 mL/min (ref >60.00)
Glucose, Bld: 101 mg/dL — ABNORMAL HIGH (ref 70–99)
Potassium: 5.2 meq/L — ABNORMAL HIGH (ref 3.5–5.1)
Sodium: 143 meq/L (ref 135–145)
Total Bilirubin: 0.4 mg/dL (ref 0.2–1.2)
Total Protein: 7.1 g/dL (ref 6.0–8.3)

## 2022-10-17 LAB — CBC
HCT: 41.5 % (ref 36.0–46.0)
Hemoglobin: 13.1 g/dL (ref 12.0–15.0)
MCHC: 31.4 g/dL (ref 30.0–36.0)
MCV: 93.6 fl (ref 78.0–100.0)
Platelets: 265 10*3/uL (ref 150.0–400.0)
RBC: 4.44 Mil/uL (ref 3.87–5.11)
RDW: 13 % (ref 11.5–15.5)
WBC: 5.2 10*3/uL (ref 4.0–10.5)

## 2022-10-17 LAB — HEMOGLOBIN A1C: Hgb A1c MFr Bld: 5.9 % (ref 4.6–6.5)

## 2022-10-17 LAB — VITAMIN D 25 HYDROXY (VIT D DEFICIENCY, FRACTURES): VITD: 50.7 ng/mL (ref 30.00–100.00)

## 2022-10-17 LAB — TSH: TSH: 1.06 u[IU]/mL (ref 0.35–5.50)

## 2022-10-27 ENCOUNTER — Other Ambulatory Visit: Payer: Self-pay | Admitting: Hematology and Oncology

## 2022-10-29 ENCOUNTER — Encounter: Payer: Self-pay | Admitting: Family Medicine

## 2022-11-01 ENCOUNTER — Encounter: Payer: Self-pay | Admitting: Family Medicine

## 2022-11-01 DIAGNOSIS — M25512 Pain in left shoulder: Secondary | ICD-10-CM

## 2022-11-01 DIAGNOSIS — M25562 Pain in left knee: Secondary | ICD-10-CM

## 2022-11-01 DIAGNOSIS — G8929 Other chronic pain: Secondary | ICD-10-CM

## 2022-11-01 MED ORDER — HYDROCODONE-ACETAMINOPHEN 5-325 MG PO TABS: 1.0000 | ORAL_TABLET | Freq: Two times a day (BID) | ORAL | 0 refills | Status: DC | PRN

## 2022-11-06 ENCOUNTER — Encounter: Payer: Self-pay | Admitting: Family Medicine

## 2022-11-21 ENCOUNTER — Encounter: Payer: Self-pay | Admitting: Family Medicine

## 2022-11-21 DIAGNOSIS — F430 Acute stress reaction: Secondary | ICD-10-CM

## 2022-11-21 MED ORDER — ALPRAZOLAM 0.25 MG PO TABS: 0.2500 mg | ORAL_TABLET | Freq: Every day | ORAL | 0 refills | Status: DC | PRN

## 2022-11-22 ENCOUNTER — Other Ambulatory Visit: Payer: Self-pay | Admitting: Family Medicine

## 2022-11-22 ENCOUNTER — Encounter: Payer: Self-pay | Admitting: Family Medicine

## 2022-11-22 DIAGNOSIS — G8929 Other chronic pain: Secondary | ICD-10-CM

## 2022-11-22 DIAGNOSIS — M25562 Pain in left knee: Secondary | ICD-10-CM

## 2022-11-22 MED ORDER — TRAMADOL HCL 50 MG PO TABS: ORAL_TABLET | ORAL | 3 refills | Status: DC

## 2022-11-30 ENCOUNTER — Encounter: Payer: Self-pay | Admitting: Family Medicine

## 2022-12-01 ENCOUNTER — Encounter: Payer: Self-pay | Admitting: Family Medicine

## 2022-12-01 DIAGNOSIS — M25412 Effusion, left shoulder: Secondary | ICD-10-CM

## 2022-12-01 DIAGNOSIS — M25562 Pain in left knee: Secondary | ICD-10-CM

## 2022-12-01 DIAGNOSIS — M545 Low back pain, unspecified: Secondary | ICD-10-CM

## 2022-12-02 MED ORDER — HYDROCODONE-ACETAMINOPHEN 5-325 MG PO TABS: 1.0000 | ORAL_TABLET | Freq: Two times a day (BID) | ORAL | 0 refills | Status: DC | PRN

## 2022-12-18 ENCOUNTER — Encounter: Payer: Self-pay | Admitting: Family Medicine

## 2022-12-30 ENCOUNTER — Encounter: Payer: Self-pay | Admitting: Family Medicine

## 2022-12-30 DIAGNOSIS — M25412 Effusion, left shoulder: Secondary | ICD-10-CM

## 2022-12-30 DIAGNOSIS — M25562 Pain in left knee: Secondary | ICD-10-CM

## 2022-12-30 DIAGNOSIS — G8929 Other chronic pain: Secondary | ICD-10-CM

## 2022-12-30 MED ORDER — HYDROCODONE-ACETAMINOPHEN 5-325 MG PO TABS: 1.0000 | ORAL_TABLET | Freq: Two times a day (BID) | ORAL | 0 refills | Status: DC | PRN

## 2022-12-31 ENCOUNTER — Other Ambulatory Visit: Payer: Self-pay | Admitting: Family Medicine

## 2022-12-31 ENCOUNTER — Encounter: Payer: Self-pay | Admitting: Family Medicine

## 2022-12-31 DIAGNOSIS — F430 Acute stress reaction: Secondary | ICD-10-CM

## 2022-12-31 MED ORDER — ALPRAZOLAM 0.25 MG PO TABS: 0.2500 mg | ORAL_TABLET | Freq: Every day | ORAL | 0 refills | Status: DC | PRN

## 2023-01-04 ENCOUNTER — Encounter: Payer: Self-pay | Admitting: Family Medicine

## 2023-01-06 ENCOUNTER — Encounter: Payer: Self-pay | Admitting: Family Medicine

## 2023-01-24 ENCOUNTER — Encounter: Payer: Self-pay | Admitting: Family Medicine

## 2023-01-24 DIAGNOSIS — M545 Low back pain, unspecified: Secondary | ICD-10-CM

## 2023-01-24 DIAGNOSIS — M25412 Effusion, left shoulder: Secondary | ICD-10-CM

## 2023-01-24 DIAGNOSIS — M25562 Pain in left knee: Secondary | ICD-10-CM

## 2023-01-24 MED ORDER — HYDROCODONE-ACETAMINOPHEN 5-325 MG PO TABS: 1.0000 | ORAL_TABLET | Freq: Two times a day (BID) | ORAL | 0 refills | Status: DC | PRN

## 2023-02-18 IMAGING — MG MM BREAST LOCALIZATION CLIP
3 series · 3 of 3 positions shown · non-contrast
Comparison: Previous exam(s).

CLINICAL DATA: Assess radioactive seed placement following
ultrasound-guided placement to localize a recently biopsied right
breast carcinoma.

EXAM:
DIAGNOSTIC RIGHT MAMMOGRAM POST ULTRASOUND-GUIDED RADIOACTIVE SEED
PLACEMENT

[R XCCL]
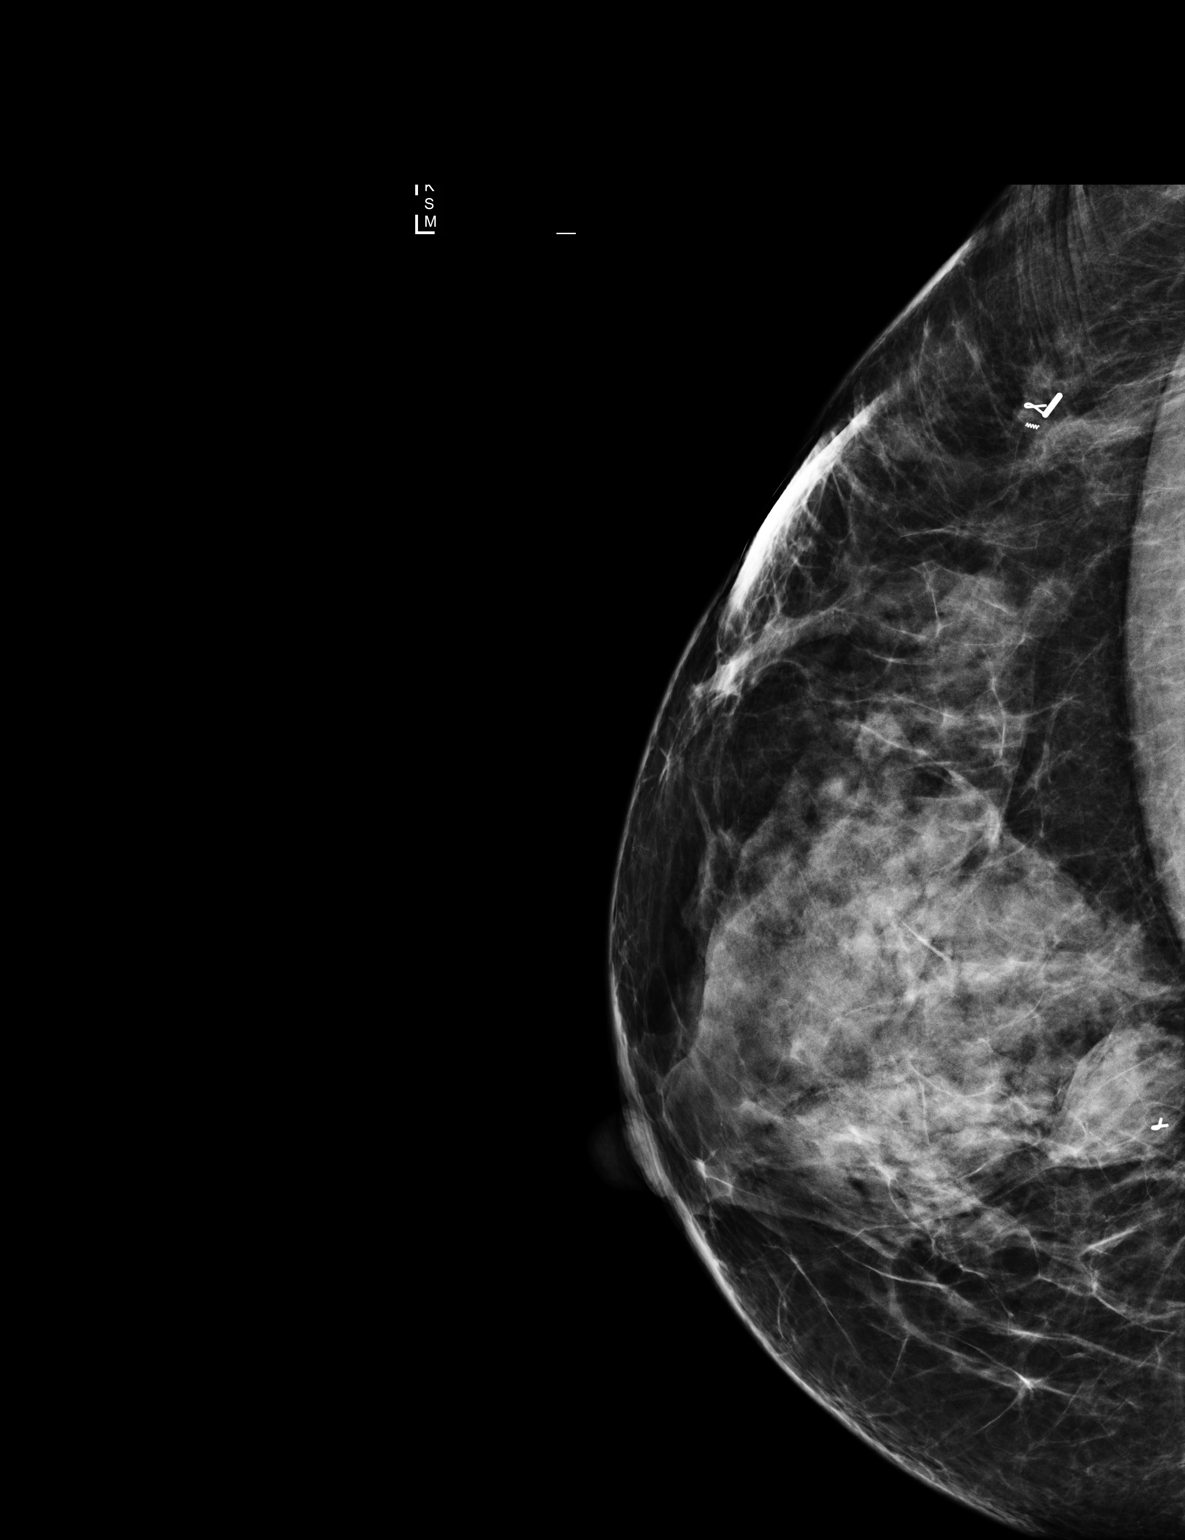

[R ML]
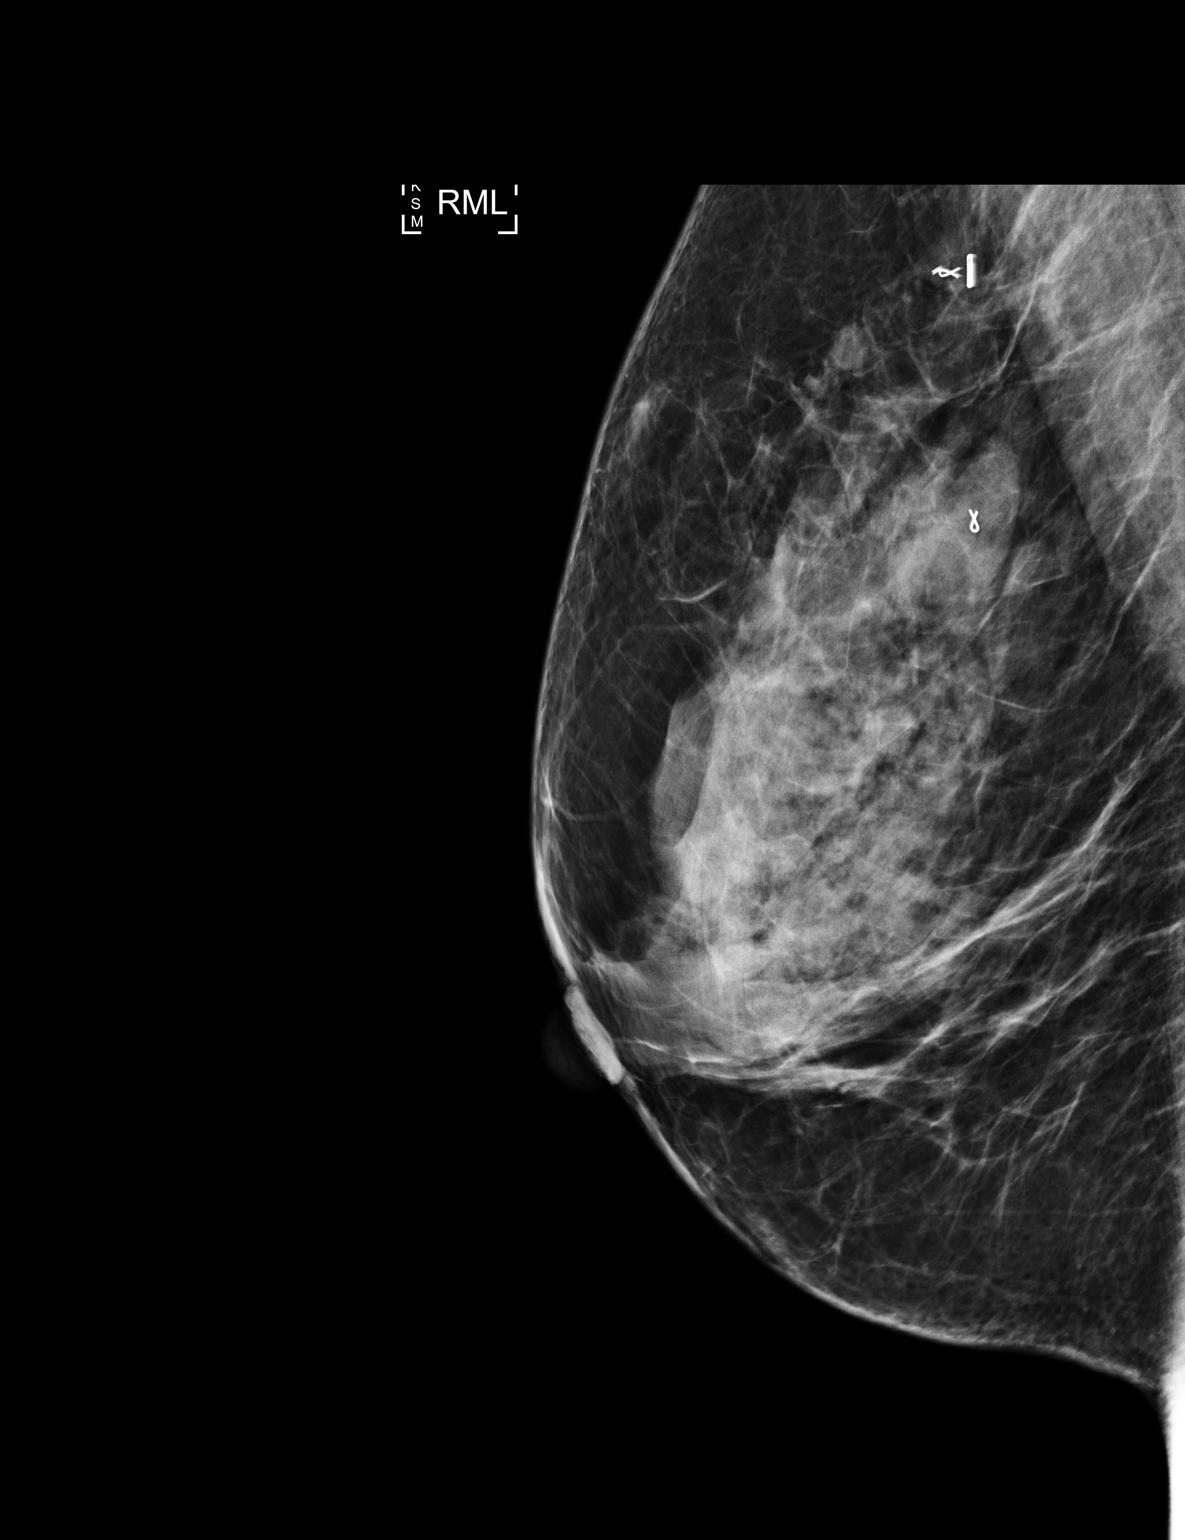

[R CC]
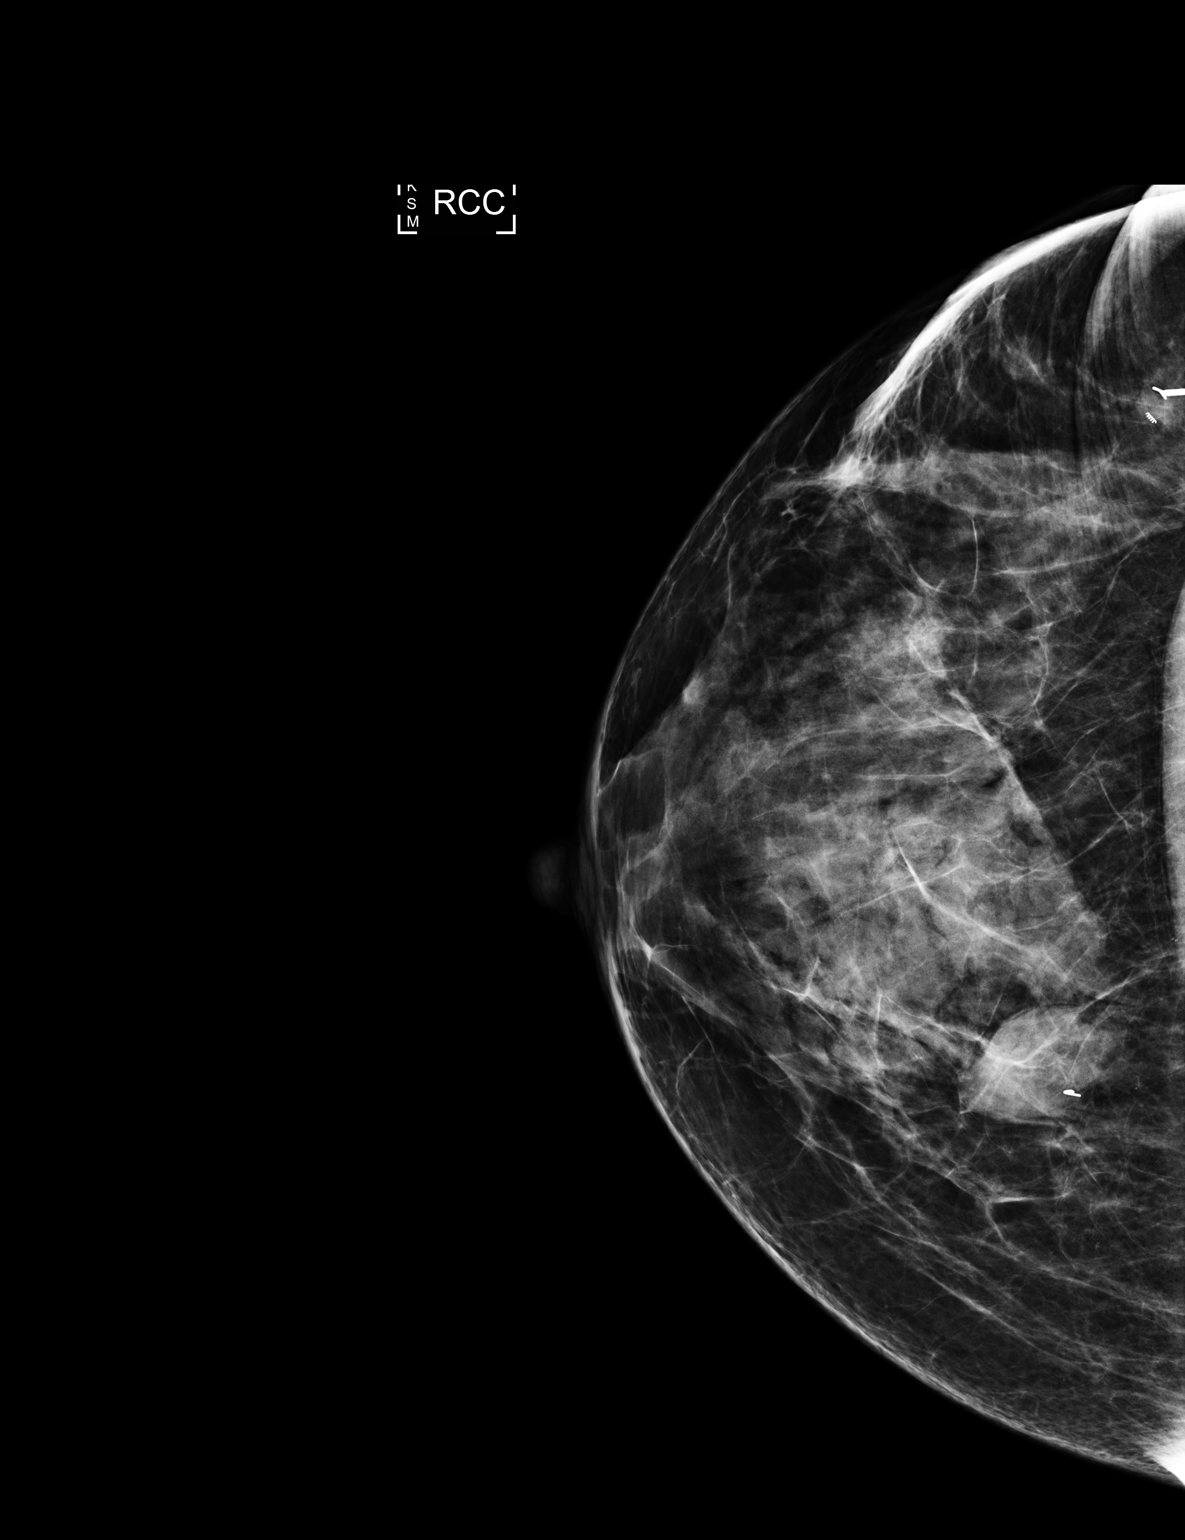

[3 of 3 positions shown; findings below may reference images not displayed]

FINDINGS: Mammographic images were obtained following ultrasound-guided
radioactive seed placement. These demonstrate the radioactive seed
to lie within the small mass, adjacent to ribbon and spiral post
biopsy marker clips, in the upper outer right breast.
IMPRESSION: Appropriate location of the radioactive seed.

Final Assessment: Post Procedure Mammograms for Seed Placement

## 2023-02-19 IMAGING — DX MM BREAST SURGICAL SPECIMEN
1 series · 2 of 2 positions shown · non-contrast
Comparison: Previous exam(s).

CLINICAL DATA: Seed localized specimen mammogram of the right
breast.

EXAM:
SPECIMEN RADIOGRAPH OF THE RIGHT BREAST

[Series 1: specimen digital x-ray · right · 0.07mm/px · 2 of 2 slices shown]
[im 1/2]
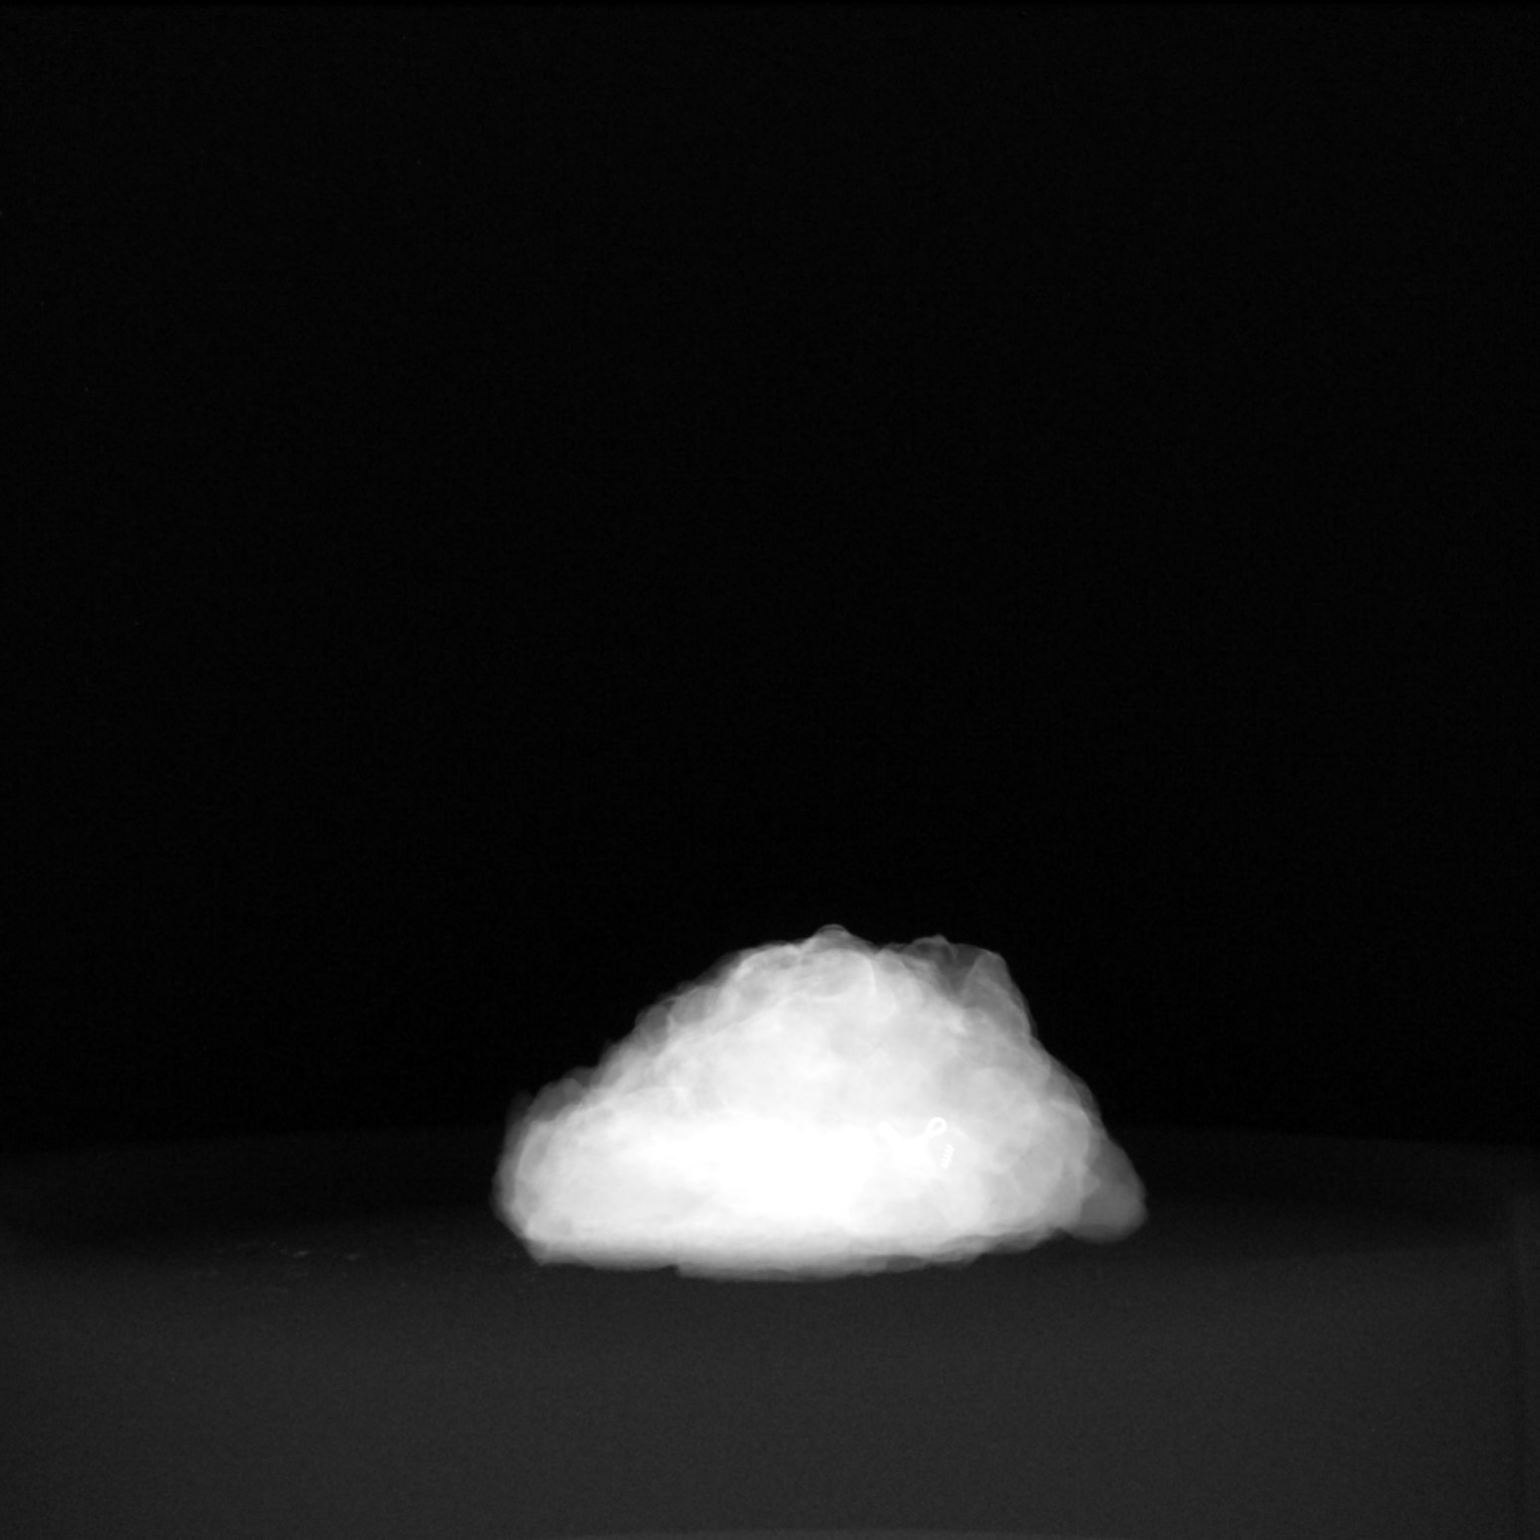
[im 2/2]
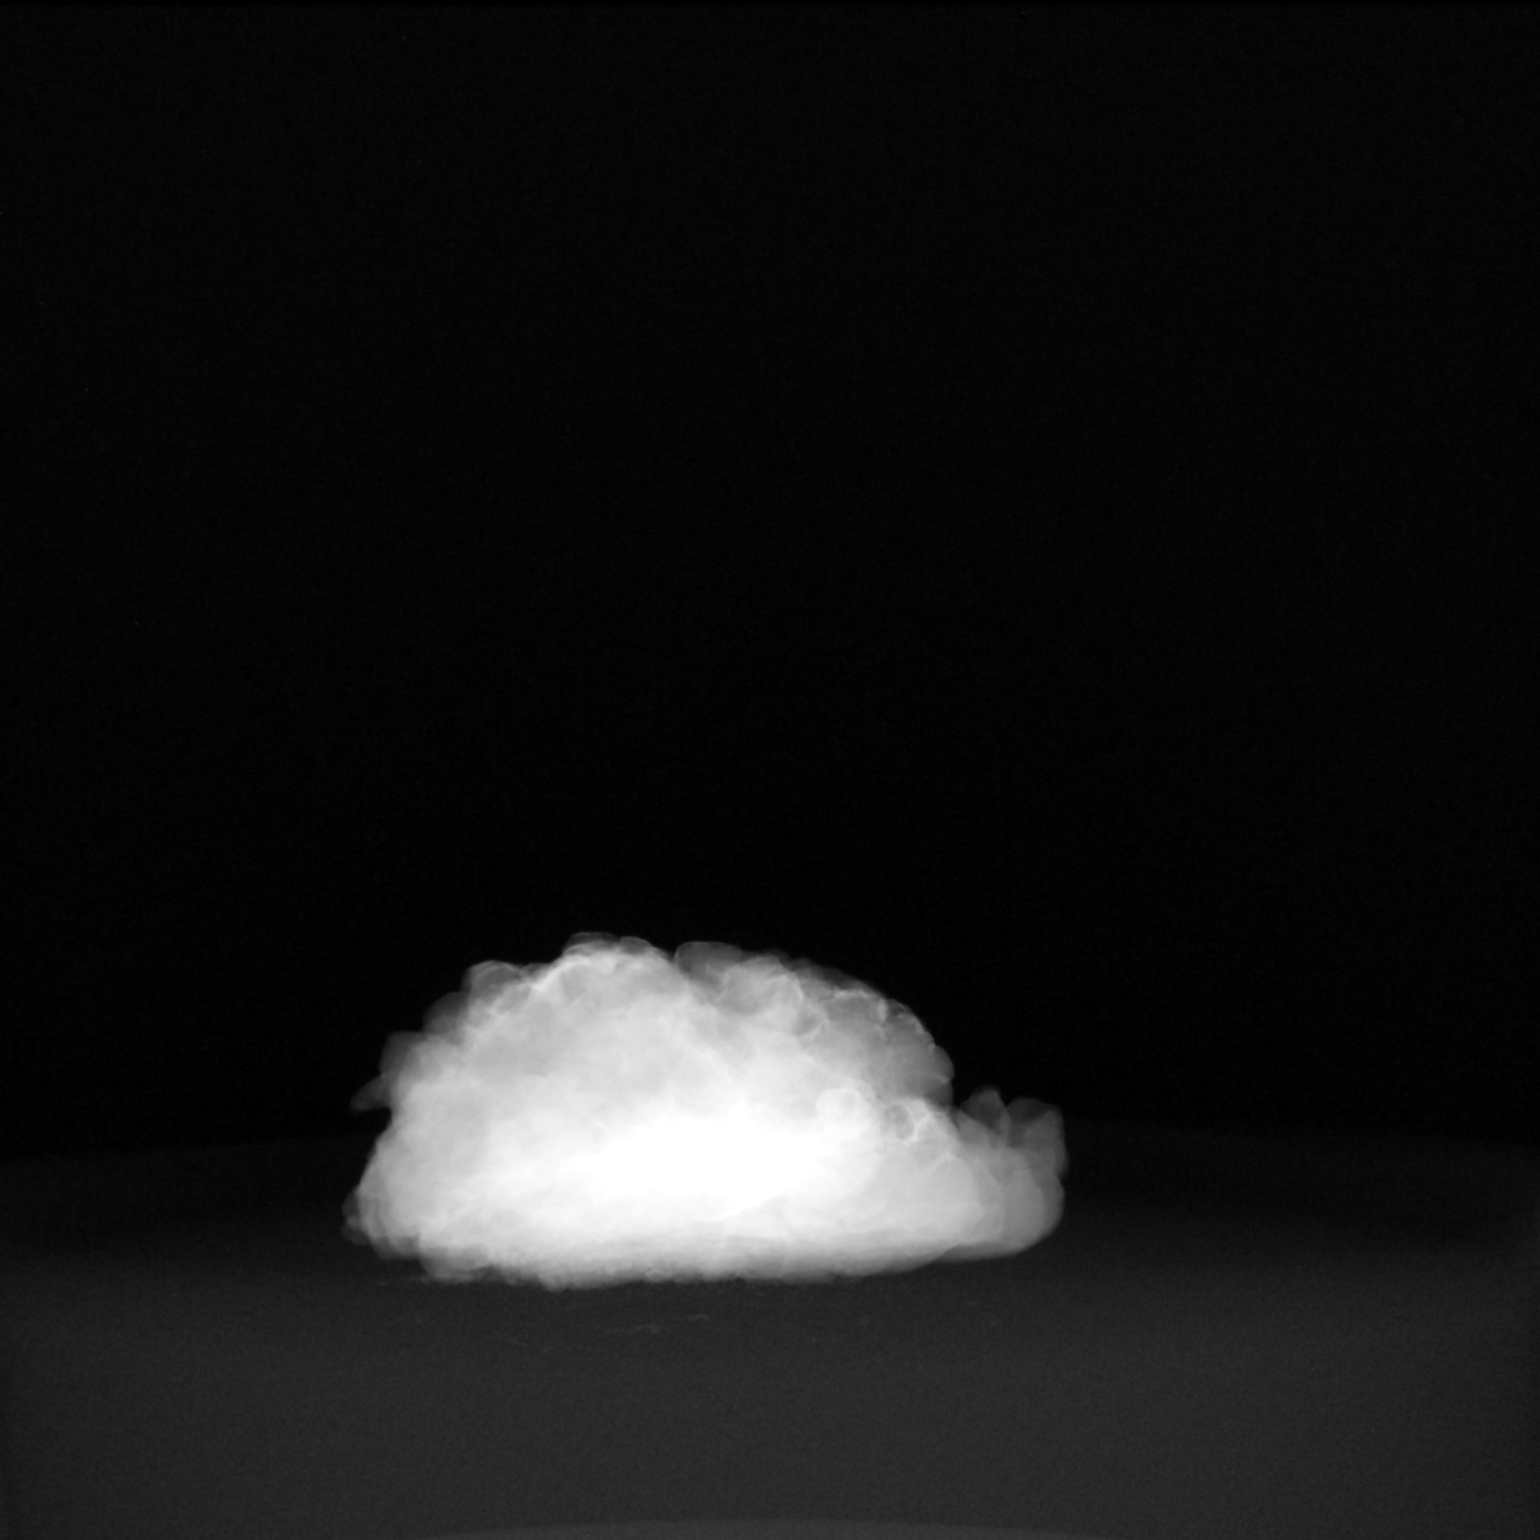

[2 of 2 positions shown; findings below may reference images not displayed]

FINDINGS: Status post excision of the right breast. The radioactive seed and
both biopsy marking clips are present, completely intact, and were
marked for pathology.
IMPRESSION: Specimen radiograph of the right breast.

## 2023-02-20 ENCOUNTER — Other Ambulatory Visit: Payer: Self-pay | Admitting: Adult Health

## 2023-02-20 DIAGNOSIS — Z9889 Other specified postprocedural states: Secondary | ICD-10-CM

## 2023-02-23 ENCOUNTER — Encounter: Payer: Self-pay | Admitting: Family Medicine

## 2023-02-23 DIAGNOSIS — M545 Low back pain, unspecified: Secondary | ICD-10-CM

## 2023-02-23 DIAGNOSIS — M25562 Pain in left knee: Secondary | ICD-10-CM

## 2023-02-23 DIAGNOSIS — M25512 Pain in left shoulder: Secondary | ICD-10-CM

## 2023-02-23 MED ORDER — HYDROCODONE-ACETAMINOPHEN 5-325 MG PO TABS: 1.0000 | ORAL_TABLET | Freq: Two times a day (BID) | ORAL | 0 refills | Status: DC | PRN

## 2023-03-21 ENCOUNTER — Other Ambulatory Visit: Payer: Self-pay | Admitting: Family Medicine

## 2023-03-21 ENCOUNTER — Encounter: Payer: Self-pay | Admitting: Family Medicine

## 2023-03-21 DIAGNOSIS — M25562 Pain in left knee: Secondary | ICD-10-CM

## 2023-03-21 DIAGNOSIS — G8929 Other chronic pain: Secondary | ICD-10-CM

## 2023-03-21 MED ORDER — TRAMADOL HCL 50 MG PO TABS: ORAL_TABLET | ORAL | 3 refills | Status: DC

## 2023-03-24 ENCOUNTER — Ambulatory Visit
Admission: RE | Admit: 2023-03-24 | Discharge: 2023-03-24 | Disposition: A | Discharge status: Home / Self Care | Payer: Medicare PPO | Source: Ambulatory Visit | Attending: Adult Health | Admitting: Adult Health

## 2023-03-24 ENCOUNTER — Encounter: Payer: Self-pay | Admitting: Family Medicine

## 2023-03-24 DIAGNOSIS — M25562 Pain in left knee: Secondary | ICD-10-CM

## 2023-03-24 DIAGNOSIS — Z08 Encounter for follow-up examination after completed treatment for malignant neoplasm: Secondary | ICD-10-CM | POA: Diagnosis not present

## 2023-03-24 DIAGNOSIS — G8929 Other chronic pain: Secondary | ICD-10-CM

## 2023-03-24 DIAGNOSIS — R92333 Mammographic heterogeneous density, bilateral breasts: Secondary | ICD-10-CM | POA: Diagnosis not present

## 2023-03-24 DIAGNOSIS — Z9889 Other specified postprocedural states: Secondary | ICD-10-CM

## 2023-03-24 DIAGNOSIS — M25412 Effusion, left shoulder: Secondary | ICD-10-CM

## 2023-03-24 DIAGNOSIS — Z853 Personal history of malignant neoplasm of breast: Secondary | ICD-10-CM | POA: Diagnosis not present

## 2023-03-24 MED ORDER — HYDROCODONE-ACETAMINOPHEN 5-325 MG PO TABS: 1.0000 | ORAL_TABLET | Freq: Two times a day (BID) | ORAL | 0 refills | Status: DC | PRN

## 2023-03-24 NOTE — Telephone Encounter (Signed)
 Okay for refill?

## 2023-04-03 ENCOUNTER — Encounter: Payer: Self-pay | Admitting: Family Medicine

## 2023-04-03 MED ORDER — ASPIRIN 81 MG PO CHEW: 81.0000 mg | CHEWABLE_TABLET | Freq: Every day | ORAL | 0 refills | Status: DC

## 2023-04-13 ENCOUNTER — Other Ambulatory Visit: Payer: Self-pay | Admitting: Family Medicine

## 2023-04-13 DIAGNOSIS — E7800 Pure hypercholesterolemia, unspecified: Secondary | ICD-10-CM

## 2023-04-13 DIAGNOSIS — I10 Essential (primary) hypertension: Secondary | ICD-10-CM

## 2023-04-21 ENCOUNTER — Other Ambulatory Visit: Payer: Self-pay | Admitting: Family Medicine

## 2023-04-21 ENCOUNTER — Encounter: Payer: Self-pay | Admitting: Family Medicine

## 2023-04-21 DIAGNOSIS — M25562 Pain in left knee: Secondary | ICD-10-CM

## 2023-04-21 DIAGNOSIS — M545 Low back pain, unspecified: Secondary | ICD-10-CM

## 2023-04-22 ENCOUNTER — Encounter: Payer: Self-pay | Admitting: Family Medicine

## 2023-04-22 DIAGNOSIS — M545 Low back pain, unspecified: Secondary | ICD-10-CM

## 2023-04-22 DIAGNOSIS — M25562 Pain in left knee: Secondary | ICD-10-CM

## 2023-04-22 DIAGNOSIS — M25412 Effusion, left shoulder: Secondary | ICD-10-CM

## 2023-04-22 MED ORDER — HYDROCODONE-ACETAMINOPHEN 5-325 MG PO TABS: 1.0000 | ORAL_TABLET | Freq: Two times a day (BID) | ORAL | 0 refills | Status: DC | PRN

## 2023-04-26 NOTE — Patient Instructions (Incomplete)
 It was good to see you today, I will be in touch with your results ASAP Please see me in about 6 months and take care!

## 2023-04-26 NOTE — Progress Notes (Unsigned)
  [facility] [address] [place], [place] [address] [phone]  Date:  04/30/2023   Name:  Casey Huerta   DOB:  01/21/1956   MRN:  [id number]  PCP:  Pearline Cables, MD    Chief Complaint: No chief complaint on file.   History of Present Illness:  Casey Huerta is a 68 y.o. very pleasant female patient who presents with the following:  Patient seen today for periodic follow-up Most recent visit with myself was in August for her physical  History of DM, chronic pain, breast cancer, pre-diabetes, CAD- history of CABG 2001 Last visit with myself was in February  Dx with breast cancer 2023. She is taking letrozole for 7 years  I am treating her chronic joint pain with Celebrex, hydrocodone 5 #30/month, tramadol 50  #30/month  Her joint pains are stable, she continues to use her medication as prescribed.   Due for update UDS today Bone density scan is up-to-date Cologuard up-to-date Mammogram up-to-date She has completed her seasonal flu shot, pneumonia vaccine, Shingrix, Tdap Blood work done in August  Trazodone as needed Amlodipine Atenolol Letrozole Crestor Celebrex, tramadol, hydrocodone  Patient Active Problem List   Diagnosis Date Noted   Family history of prostate cancer 05/23/2021   Family history of ovarian cancer 05/23/2021   Family history of leukemia 05/23/2021   Malignant neoplasm of upper-outer quadrant of right breast in female, estrogen receptor positive (HCC) 05/03/2021   Prediabetes 02/28/2020   Osteoarthritis of both knees 06/14/2016   Breast mass 05/17/2016   Aortic insufficiency 03/06/2012   Dyslipidemia 06/19/2007   Essential hypertension 06/19/2007   Hx of CABG 06/19/2007    Past Medical History:  Diagnosis Date   Aortic insufficiency    a. Mild-mod AI by echo 2010.   Arthritis    Breast cancer (HCC) 04/25/2021   Breast mass 05/17/2016   BIRADS 3: biopsy revealed  benign fibroadenoma   COPD    CORONARY ARTERY DISEASE    a. CABG (LIMA-->LAD 2001). b. Myoview 2008 no ischemia, EF 63%.  c. cath 10/14/2013 mild 20% ost LAD, atretic LIMA, EF 55%, medical therapy   Family history of leukemia    Family history of ovarian cancer    Family history of prostate cancer    Hematemesis 09/2018   Hyperlipidemia    MENORRHAGIA    Unspecified essential hypertension     Past Surgical History:  Procedure Laterality Date   arthroscopic knee surgery     BIOPSY  09/23/2018   Procedure: BIOPSY;  Surgeon: Charna Elizabeth, MD;  Location: [facility] ENDOSCOPY;  Service: Endoscopy;;   BREAST BIOPSY Right 05/28/2016   HYALINIZED FIBROADENOMA   BREAST LUMPECTOMY Right 06/06/2021   BREAST LUMPECTOMY WITH RADIOACTIVE SEED AND SENTINEL LYMPH NODE BIOPSY Right 06/06/2021   Procedure: RIGHT BREAST LUMPECTOMY WITH RADIOACTIVE SEED AND SENTINEL LYMPH NODE BIOPSY;  Surgeon: Griselda Miner, MD;  Location: [facility];  Service: General;  Laterality: Right;   CESAREAN SECTION     CORONARY ARTERY BYPASS GRAFT  2001   ESOPHAGOGASTRODUODENOSCOPY (EGD) WITH PROPOFOL Left 09/23/2018   Procedure: ESOPHAGOGASTRODUODENOSCOPY (EGD) WITH PROPOFOL;  Surgeon: Charna Elizabeth, MD;  Location: [facility] ENDOSCOPY;  Service: Endoscopy;  Laterality: Left;   LEFT HEART CATH AND CORONARY ANGIOGRAPHY N/A 01/17/2017   Procedure: LEFT HEART CATH AND CORONARY ANGIOGRAPHY;  Surgeon: Lyn Records, MD;  Location: [facility] INVASIVE CV LAB;  Service: Cardiovascular;  Laterality: N/A;  LEFT HEART CATHETERIZATION WITH CORONARY/GRAFT ANGIOGRAM N/A 10/12/2013   Procedure: LEFT HEART CATHETERIZATION WITH Isabel Caprice;  Surgeon: Lennette Bihari, MD;  Location: [facility] CATH LAB;  Service: Cardiovascular;  Laterality: N/A;    Social History   Tobacco Use   Smoking status: Former    Current packs/day: 0.00    Types: Cigarettes    Quit date: 1980    Years since quitting: 45.2   Smokeless tobacco: Never   Tobacco  comments:    QUIT SMOKING IN 1999  Vaping Use   Vaping status: Never Used  Substance Use Topics   Alcohol use: Yes    Comment: friday nights - 2 glasses - rare   Drug use: No    Comment:  quit using in 1985    Family History  Problem Relation Age of Onset   Heart disease Mother    Cancer Mother        possibly ovarian cancer   Lung cancer Father    Heart disease Sister    Ovarian cancer Sister    Cancer - Prostate Brother 70   Leukemia Niece        great niece died of leukemia at 51    Allergies  Allergen Reactions   Simvastatin Other (See Comments)    REACTION: constipation    Medication list has been reviewed and updated.  Current Outpatient Medications on File Prior to Visit  Medication Sig Dispense Refill   ALPRAZolam (XANAX) 0.25 MG tablet Take 1 tablet (0.25 mg total) by mouth daily as needed for anxiety. 30 tablet 0   amLODipine (NORVASC) 5 MG tablet Take 1 tablet by mouth once daily 90 tablet 0   aspirin (EQ ASPIRIN LOW DOSE) 81 MG chewable tablet Chew 1 tablet (81 mg total) by mouth daily. 90 tablet 0   atenolol (TENORMIN) 12.5 mg TABS tablet Atenolol     celecoxib (CELEBREX) 100 MG capsule TAKE 1 CAPSULE BY MOUTH TWICE DAILY AS NEEDED FOR JOINT PAIN 180 capsule 3   HYDROcodone-acetaminophen (NORCO/VICODIN) 5-325 MG tablet Take 1 tablet by mouth every 12 (twelve) hours as needed for up to 30 doses for severe pain (pain score 7-10) or moderate pain (pain score 4-6). 30 tablet 0   letrozole (FEMARA) 2.5 MG tablet Take 1 tablet by mouth once daily 90 tablet 1   Multiple Vitamins-Minerals (MULTIVITAMIN WITH MINERALS) tablet Take 1 tablet by mouth daily.     naloxone (NARCAN) nasal spray 4 mg/0.1 mL One spray in nostril as needed for overdose - repeat in 4 minutes as needed until help arrives 1 each PRN   nitroGLYCERIN (NITROSTAT) 0.4 MG SL tablet Place 1 tablet (0.4 mg total) under the tongue every 5 (five) minutes x 3 doses as needed for chest pain. 25 tablet 1    ondansetron (ZOFRAN-ODT) 4 MG disintegrating tablet Take 4 mg by mouth every 8 (eight) hours as needed.     rosuvastatin (CRESTOR) 20 MG tablet Take 1 tablet by mouth once daily 90 tablet 0   traMADol (ULTRAM) 50 MG tablet TAKE 1 TABLET BY MOUTH ONCE DAILY AT  BEDTIME  *  DO  NOT  MIX  WITH  XANAX  * 30 tablet 2   No current facility-administered medications on file prior to visit.    Review of Systems:  As per HPI- otherwise negative.   Physical Examination: There were no vitals filed for this visit. There were no vitals filed for this visit. There is no height or weight on  file to calculate BMI. Ideal Body Weight:    GEN: no acute distress. HEENT: Atraumatic, Normocephalic.  Ears and Nose: No external deformity. CV: RRR, No M/G/R. No JVD. No thrill. No extra heart sounds. PULM: CTA B, no wheezes, crackles, rhonchi. No retractions. No resp. distress. No accessory muscle use. ABD: S, NT, ND, +BS. No rebound. No HSM. EXTR: No c/c/e PSYCH: Normally interactive. Conversant.    Assessment and Plan: ***  Signed Abbe Amsterdam, MD

## 2023-04-30 ENCOUNTER — Ambulatory Visit (INDEPENDENT_AMBULATORY_CARE_PROVIDER_SITE_OTHER): Payer: Medicare PPO | Admitting: Family Medicine

## 2023-04-30 VITALS — BP 122/62 | HR 85 | Temp 98.1°F | Resp 18 | Ht 62.0 in | Wt 136.0 lb

## 2023-04-30 DIAGNOSIS — M25562 Pain in left knee: Secondary | ICD-10-CM | POA: Diagnosis not present

## 2023-04-30 DIAGNOSIS — E7800 Pure hypercholesterolemia, unspecified: Secondary | ICD-10-CM

## 2023-04-30 DIAGNOSIS — I10 Essential (primary) hypertension: Secondary | ICD-10-CM

## 2023-04-30 DIAGNOSIS — M25561 Pain in right knee: Secondary | ICD-10-CM | POA: Diagnosis not present

## 2023-04-30 DIAGNOSIS — M79644 Pain in right finger(s): Secondary | ICD-10-CM

## 2023-04-30 DIAGNOSIS — Z5181 Encounter for therapeutic drug level monitoring: Secondary | ICD-10-CM

## 2023-04-30 DIAGNOSIS — G8929 Other chronic pain: Secondary | ICD-10-CM

## 2023-04-30 MED ORDER — CELECOXIB 100 MG PO CAPS: ORAL_CAPSULE | ORAL | 3 refills | Status: AC

## 2023-04-30 MED ORDER — ROSUVASTATIN CALCIUM 20 MG PO TABS: 20.0000 mg | ORAL_TABLET | Freq: Every day | ORAL | 3 refills | Status: AC

## 2023-04-30 MED ORDER — AMLODIPINE BESYLATE 5 MG PO TABS
5.0000 mg | ORAL_TABLET | Freq: Every day | ORAL | 3 refills | Status: AC
Start: 2023-04-30 — End: ?

## 2023-05-01 LAB — CBC
HCT: 40.4 % (ref 36.0–46.0)
Hemoglobin: 13.2 g/dL (ref 12.0–15.0)
MCHC: 32.6 g/dL (ref 30.0–36.0)
MCV: 93 fl (ref 78.0–100.0)
Platelets: 227 10*3/uL (ref 150.0–400.0)
RBC: 4.34 Mil/uL (ref 3.87–5.11)
RDW: 12.9 % (ref 11.5–15.5)
WBC: 4 10*3/uL (ref 4.0–10.5)

## 2023-05-01 LAB — BASIC METABOLIC PANEL
BUN: 10 mg/dL (ref 6–23)
CO2: 26 meq/L (ref 19–32)
Calcium: 10 mg/dL (ref 8.4–10.5)
Chloride: 106 meq/L (ref 96–112)
Creatinine, Ser: 1.02 mg/dL (ref 0.40–1.20)
GFR: 57.3 mL/min — ABNORMAL LOW (ref >60.00)
Glucose, Bld: 188 mg/dL — ABNORMAL HIGH (ref 70–99)
Potassium: 4.9 meq/L (ref 3.5–5.1)
Sodium: 140 meq/L (ref 135–145)

## 2023-05-02 ENCOUNTER — Encounter: Payer: Self-pay | Admitting: Family Medicine

## 2023-05-02 LAB — DRUG MONITORING, PANEL 8 WITH CONFIRMATION, URINE
6 Acetylmorphine: NEGATIVE ng/mL (ref <10)
Alcohol Metabolites: NEGATIVE ng/mL (ref <500)
Amphetamines: NEGATIVE ng/mL (ref <500)
Benzodiazepines: NEGATIVE ng/mL (ref <100)
Buprenorphine, Urine: NEGATIVE ng/mL (ref <5)
Cocaine Metabolite: NEGATIVE ng/mL (ref <150)
Codeine: NEGATIVE ng/mL (ref <50)
Creatinine: 135.8 mg/dL (ref >=20.0)
Hydrocodone: 813 ng/mL — ABNORMAL HIGH (ref <50)
Hydromorphone: 344 ng/mL — ABNORMAL HIGH (ref <50)
MDMA: NEGATIVE ng/mL (ref <500)
Marijuana Metabolite: NEGATIVE ng/mL (ref <20)
Morphine: NEGATIVE ng/mL (ref <50)
Norhydrocodone: 1139 ng/mL — ABNORMAL HIGH (ref <50)
Opiates: POSITIVE ng/mL — AB (ref <100)
Oxidant: NEGATIVE ug/mL (ref <200)
Oxycodone: NEGATIVE ng/mL (ref <100)
pH: 5.6 (ref 4.5–9.0)

## 2023-05-02 LAB — DM TEMPLATE

## 2023-05-08 ENCOUNTER — Encounter: Payer: Self-pay | Admitting: Family Medicine

## 2023-05-08 ENCOUNTER — Other Ambulatory Visit: Payer: Self-pay | Admitting: Hematology and Oncology

## 2023-05-20 ENCOUNTER — Telehealth: Payer: Self-pay | Admitting: Family Medicine

## 2023-05-20 NOTE — Telephone Encounter (Signed)
 Copied from CRM [id number]. Topic: Medicare AWV >> May 20, 2023 10:44 AM Payton Doughty wrote: Reason for CRM: Called LVM 05/20/2023 to schedule AWV. Please schedule Virtual or Telehealth visits ONLY.   Verlee Rossetti; Care Guide Ambulatory Clinical Support [place] [facility] Direct Dial: [phone]

## 2023-05-21 ENCOUNTER — Encounter: Payer: Self-pay | Admitting: Family Medicine

## 2023-05-21 DIAGNOSIS — M25412 Effusion, left shoulder: Secondary | ICD-10-CM

## 2023-05-21 DIAGNOSIS — M25562 Pain in left knee: Secondary | ICD-10-CM

## 2023-05-21 DIAGNOSIS — M545 Low back pain, unspecified: Secondary | ICD-10-CM

## 2023-05-22 ENCOUNTER — Encounter: Payer: Self-pay | Admitting: Family Medicine

## 2023-05-22 DIAGNOSIS — F430 Acute stress reaction: Secondary | ICD-10-CM

## 2023-05-22 MED ORDER — HYDROCODONE-ACETAMINOPHEN 5-325 MG PO TABS: 1.0000 | ORAL_TABLET | Freq: Two times a day (BID) | ORAL | 0 refills | Status: DC | PRN

## 2023-05-23 MED ORDER — ALPRAZOLAM 0.25 MG PO TABS: 0.2500 mg | ORAL_TABLET | Freq: Every day | ORAL | 0 refills | Status: DC | PRN

## 2023-06-02 ENCOUNTER — Ambulatory Visit: Payer: Medicare PPO | Admitting: Hematology and Oncology

## 2023-06-11 ENCOUNTER — Inpatient Hospital Stay: Attending: Hematology and Oncology | Admitting: Hematology and Oncology

## 2023-06-15 NOTE — Progress Notes (Signed)
 [facility] at [facility] [address], [address] [place], [place] [address] [phone]  Date:  06/18/2023   Name:  Casey Huerta   DOB:  01-10-1956   MRN:  [id number]  PCP:  Kaylee Partridge, MD    Chief Complaint: No chief complaint on file.   History of Present Illness:  Casey Huerta is a 68 y.o. very pleasant female patient who presents with the following:  Pt seen today for a mobility examination Last seen by myself just recently for a medication follow-up History of DM, chronic pain, breast cancer, pre-diabetes, CAD- history of CABG 2001 Dx with breast cancer 2023. She is taking letrozole  for 7 years.  She follows up with hematology annually I am treating her chronic joint pain with Celebrex , and either hydrocodone  or tramadol  depending on severity of pain  What has changed to require a power mobility device: increase in knee pain and fatigue/ SOB with exercise  Her shortness of breath is worsened over the last 2 to 3 months, gradually becoming more significant Her exercise tolerance is affected by her history of heart disease in addition to her orthopedic problems Height and weight, oxygen saturation:  see below Any history of edema? no History and location of any pressure sores? No  Ability to shift weight? Yes   Major activities of daily living that are impaired by mobility limitations: shopping for food and other errands.  She sometimes has difficulty moving around her home such as getting to the bathroom to toilet and bathe or bedroom to get dressed.  States that she will "hang onto the wall" to help herself move around her home Reason why a cane or walker will not meet mobility needs at home: she does use a cane at home but cannot use for longer distances due to fatigue Reason why a manual wheelchair will not meet mobility needs at home: her arm arm strength and arm fatigue limits use  A scooter would potentially meet  her needs but is not affordable for patient  Patient can safely operate a power mobility device both mentally and physically.  She is willing and motivated to have and use the power mobility device in her home  We discussed her increased shortness of breath with activity.  She denies chest pain.  She does have history of CABG and has no longer seeing cardiology . She has seen DR Audery Blazing in the past- will get her back in to see him     Patient Active Problem List   Diagnosis Date Noted   Family history of prostate cancer 05/23/2021   Family history of ovarian cancer 05/23/2021   Family history of leukemia 05/23/2021   Malignant neoplasm of upper-outer quadrant of right breast in female, estrogen receptor positive (HCC) 05/03/2021   Prediabetes 02/28/2020   Osteoarthritis of both knees 06/14/2016   Breast mass 05/17/2016   Aortic insufficiency 03/06/2012   Dyslipidemia 06/19/2007   Essential hypertension 06/19/2007   Hx of CABG 06/19/2007    Past Medical History:  Diagnosis Date   Aortic insufficiency    a. Mild-mod AI by echo 2010.   Arthritis    Breast cancer (HCC) 04/25/2021   Breast mass 05/17/2016   BIRADS 3: biopsy revealed benign fibroadenoma   COPD    CORONARY ARTERY DISEASE    a. CABG (LIMA-->LAD 2001). b. Myoview 2008 no ischemia, EF 63%.  c. cath 10/14/2013 mild 20% ost LAD, atretic LIMA, EF  55%, medical therapy   Family history of leukemia    Family history of ovarian cancer    Family history of prostate cancer    Hematemesis 09/2018   Hyperlipidemia    MENORRHAGIA    Unspecified essential hypertension     Past Surgical History:  Procedure Laterality Date   arthroscopic knee surgery     BIOPSY  09/23/2018   Procedure: BIOPSY;  Surgeon: Tami Falcon, MD;  Location: [facility] ENDOSCOPY;  Service: Endoscopy;;   BREAST BIOPSY Right 05/28/2016   HYALINIZED FIBROADENOMA   BREAST LUMPECTOMY Right 06/06/2021   BREAST LUMPECTOMY WITH RADIOACTIVE SEED AND SENTINEL LYMPH  NODE BIOPSY Right 06/06/2021   Procedure: RIGHT BREAST LUMPECTOMY WITH RADIOACTIVE SEED AND SENTINEL LYMPH NODE BIOPSY;  Surgeon: Caralyn Chandler, MD;  Location: [facility];  Service: General;  Laterality: Right;   CESAREAN SECTION     CORONARY ARTERY BYPASS GRAFT  2001   ESOPHAGOGASTRODUODENOSCOPY (EGD) WITH PROPOFOL  Left 09/23/2018   Procedure: ESOPHAGOGASTRODUODENOSCOPY (EGD) WITH PROPOFOL ;  Surgeon: Tami Falcon, MD;  Location: [facility] ENDOSCOPY;  Service: Endoscopy;  Laterality: Left;   LEFT HEART CATH AND CORONARY ANGIOGRAPHY N/A 01/17/2017   Procedure: LEFT HEART CATH AND CORONARY ANGIOGRAPHY;  Surgeon: Arty Binning, MD;  Location: [facility] INVASIVE CV LAB;  Service: Cardiovascular;  Laterality: N/A;   LEFT HEART CATHETERIZATION WITH CORONARY/GRAFT ANGIOGRAM N/A 10/12/2013   Procedure: LEFT HEART CATHETERIZATION WITH Estella Helling;  Surgeon: Millicent Ally, MD;  Location: [facility] CATH LAB;  Service: Cardiovascular;  Laterality: N/A;    Social History   Tobacco Use   Smoking status: Former    Current packs/day: 0.00    Types: Cigarettes    Quit date: 1980    Years since quitting: 45.3   Smokeless tobacco: Never   Tobacco comments:    QUIT SMOKING IN 1999  Vaping Use   Vaping status: Never Used  Substance Use Topics   Alcohol use: Yes    Comment: friday nights - 2 glasses - rare   Drug use: No    Comment:  quit using in 1985    Family History  Problem Relation Age of Onset   Heart disease Mother    Cancer Mother        possibly ovarian cancer   Lung cancer Father    Heart disease Sister    Ovarian cancer Sister    Cancer - Prostate Brother 2   Leukemia Niece        great niece died of leukemia at 18    Allergies  Allergen Reactions   Simvastatin Other (See Comments)    REACTION: constipation    Medication list has been reviewed and updated.  Current Outpatient Medications on File Prior to Visit  Medication Sig Dispense Refill   ALPRAZolam   (XANAX ) 0.25 MG tablet Take 1 tablet (0.25 mg total) by mouth daily as needed for anxiety. 30 tablet 0   amLODipine  (NORVASC ) 5 MG tablet Take 1 tablet (5 mg total) by mouth daily. 90 tablet 3   aspirin  (EQ ASPIRIN  LOW DOSE) 81 MG chewable tablet Chew 1 tablet (81 mg total) by mouth daily. 90 tablet 0   atenolol (TENORMIN) 12.5 mg TABS tablet Atenolol     celecoxib  (CELEBREX ) 100 MG capsule TAKE 1 CAPSULE BY MOUTH TWICE DAILY AS NEEDED FOR JOINT PAIN 180 capsule 3   HYDROcodone -acetaminophen  (NORCO/VICODIN) 5-325 MG tablet Take 1 tablet by mouth every 12 (twelve) hours as needed for up to 30 doses for severe pain (  pain score 7-10) or moderate pain (pain score 4-6). 30 tablet 0   letrozole  (FEMARA ) 2.5 MG tablet Take 1 tablet by mouth once daily 90 tablet 0   Multiple Vitamins-Minerals (MULTIVITAMIN WITH MINERALS) tablet Take 1 tablet by mouth daily.     naloxone  (NARCAN ) nasal spray 4 mg/0.1 mL One spray in nostril as needed for overdose - repeat in 4 minutes as needed until help arrives 1 each PRN   nitroGLYCERIN  (NITROSTAT ) 0.4 MG SL tablet Place 1 tablet (0.4 mg total) under the tongue every 5 (five) minutes x 3 doses as needed for chest pain. 25 tablet 1   ondansetron  (ZOFRAN -ODT) 4 MG disintegrating tablet Take 4 mg by mouth every 8 (eight) hours as needed.     rosuvastatin  (CRESTOR ) 20 MG tablet Take 1 tablet (20 mg total) by mouth daily. 90 tablet 3   traMADol  (ULTRAM ) 50 MG tablet TAKE 1 TABLET BY MOUTH ONCE DAILY AT  BEDTIME  *  DO  NOT  MIX  WITH  XANAX   * 30 tablet 2   No current facility-administered medications on file prior to visit.    Review of Systems:  As per HPI- otherwise negative.   Physical Examination: Vitals:   06/18/23 1405  BP: 126/64  Pulse: 77  Resp: 18  Temp: 98.5 F (36.9 C)  SpO2: 97%   Vitals:   06/18/23 1405  Weight: 135 lb 3.2 oz (61.3 kg)  Height: 5\' 2"  (1.575 m)   Body mass index is 24.73 kg/m. Ideal Body Weight: Weight in (lb) to have BMI =  25: 136.4  GEN: no acute distress.  Normal weight, looks well HEENT: Atraumatic, Normocephalic. Bilateral TM wnl, oropharynx normal.  PEERL,EOMI.   Ears and Nose: No external deformity. CV: RRR, No M/G/R. No JVD. No thrill. No extra heart sounds. PULM: CTA B, no wheezes, crackles, rhonchi. No retractions. No resp. distress. No accessory muscle use. ABD: S, NT, ND, +BS. No rebound. No HSM. EXTR: No c/c/e PSYCH: Normally interactive. Conversant.   Upper and lower extremity assessment, gait assessment:  Upper limb strength is normal bilaterally at biceps, triceps, deltoids, forearm, and hands. Lower limb strength is 4/5 bilaterally She has no pain with range of motion of her upper limbs. She has pain with flexion and extension of her bilateral knees.  Extension of her knees is about 10 degrees less than normal.  Limited by pain and arthritic changes.  Otherwise limb range of motion is normal Deep tendon reflexes and sensation normal all limbs Her gait is slow to start, she favors both knees-the right is a bit worse.  Gait is especially slow to get started, somewhat shuffling Assessment and Plan: Encounter for power mobility device assessment  SOB (shortness of breath) on exertion - Plan: DG Chest 2 View, Ambulatory referral to Cardiology  Chronic pain of both knees  Abnormality of gait and mobility  Patient seen today to discuss mobility device, she brings paperwork for a [place] mobility device We have completed her assessment and will submit paperwork as requested  Patient notes subacute shortness of breath, worsening over the last 2 or 3 months Concerned about her history of heart disease, I will refer her back to cardiology.  Obtain chest x-ray today  I encouraged her to see her orthopedist regarding her knee pain, injection and/or surgery may be helpful for her  Signed Gates Kasal, MD

## 2023-06-17 ENCOUNTER — Encounter: Payer: Self-pay | Admitting: Family Medicine

## 2023-06-18 ENCOUNTER — Other Ambulatory Visit: Payer: Self-pay | Admitting: Emergency Medicine

## 2023-06-18 ENCOUNTER — Ambulatory Visit (INDEPENDENT_AMBULATORY_CARE_PROVIDER_SITE_OTHER): Admitting: Family Medicine

## 2023-06-18 ENCOUNTER — Ambulatory Visit (HOSPITAL_BASED_OUTPATIENT_CLINIC_OR_DEPARTMENT_OTHER)
Admission: RE | Admit: 2023-06-18 | Discharge: 2023-06-18 | Disposition: A | Discharge status: Home / Self Care | Source: Ambulatory Visit | Attending: Family Medicine | Admitting: Family Medicine

## 2023-06-18 ENCOUNTER — Encounter: Payer: Self-pay | Admitting: Family Medicine

## 2023-06-18 VITALS — BP 126/64 | HR 77 | Temp 98.5°F | Resp 18 | Ht 62.0 in | Wt 135.2 lb

## 2023-06-18 DIAGNOSIS — J449 Chronic obstructive pulmonary disease, unspecified: Secondary | ICD-10-CM | POA: Diagnosis not present

## 2023-06-18 DIAGNOSIS — M25561 Pain in right knee: Secondary | ICD-10-CM

## 2023-06-18 DIAGNOSIS — M25562 Pain in left knee: Secondary | ICD-10-CM

## 2023-06-18 DIAGNOSIS — G8929 Other chronic pain: Secondary | ICD-10-CM | POA: Diagnosis not present

## 2023-06-18 DIAGNOSIS — R0602 Shortness of breath: Secondary | ICD-10-CM | POA: Diagnosis not present

## 2023-06-18 DIAGNOSIS — Z7689 Persons encountering health services in other specified circumstances: Secondary | ICD-10-CM

## 2023-06-18 DIAGNOSIS — R269 Unspecified abnormalities of gait and mobility: Secondary | ICD-10-CM

## 2023-06-18 NOTE — Patient Instructions (Signed)
 It was good to see you today.  Please call [facility] supply and asked them about the process for doing a mobility scooter.  If it turns out the [facility] is a better option for you after our just let me know.  I can also complete paperwork you might need for a scooter  Please stop by imaging on the ground floor and have an x-ray of your chest  I will work on getting you back in with cardiology to follow-up your history of heart disease and shortness of breath  Please consider seeing orthopedics about your knee pain!

## 2023-06-20 ENCOUNTER — Encounter: Payer: Self-pay | Admitting: Family Medicine

## 2023-06-20 DIAGNOSIS — M25562 Pain in left knee: Secondary | ICD-10-CM

## 2023-06-20 DIAGNOSIS — M545 Low back pain, unspecified: Secondary | ICD-10-CM

## 2023-06-20 DIAGNOSIS — M25412 Effusion, left shoulder: Secondary | ICD-10-CM

## 2023-06-20 MED ORDER — HYDROCODONE-ACETAMINOPHEN 5-325 MG PO TABS: 1.0000 | ORAL_TABLET | Freq: Two times a day (BID) | ORAL | 0 refills | Status: DC | PRN

## 2023-06-20 MED ORDER — TRAMADOL HCL 50 MG PO TABS: ORAL_TABLET | ORAL | 2 refills | Status: DC

## 2023-06-24 ENCOUNTER — Encounter: Payer: Self-pay | Admitting: Family Medicine

## 2023-06-26 ENCOUNTER — Inpatient Hospital Stay: Attending: Hematology and Oncology | Admitting: Hematology and Oncology

## 2023-06-26 VITALS — BP 154/53 | HR 78 | Temp 98.5°F | Resp 18 | Ht 62.0 in | Wt 135.5 lb

## 2023-06-26 DIAGNOSIS — Z7982 Long term (current) use of aspirin: Secondary | ICD-10-CM | POA: Diagnosis not present

## 2023-06-26 DIAGNOSIS — Z1731 Human epidermal growth factor receptor 2 positive status: Secondary | ICD-10-CM | POA: Insufficient documentation

## 2023-06-26 DIAGNOSIS — Z79899 Other long term (current) drug therapy: Secondary | ICD-10-CM | POA: Insufficient documentation

## 2023-06-26 DIAGNOSIS — Z1721 Progesterone receptor positive status: Secondary | ICD-10-CM | POA: Insufficient documentation

## 2023-06-26 DIAGNOSIS — C50411 Malignant neoplasm of upper-outer quadrant of right female breast: Secondary | ICD-10-CM | POA: Diagnosis present

## 2023-06-26 DIAGNOSIS — Z170 Estrogen receptor positive status [ER+]: Secondary | ICD-10-CM | POA: Insufficient documentation

## 2023-06-26 DIAGNOSIS — Z923 Personal history of irradiation: Secondary | ICD-10-CM | POA: Insufficient documentation

## 2023-06-26 DIAGNOSIS — Z79811 Long term (current) use of aromatase inhibitors: Secondary | ICD-10-CM | POA: Diagnosis not present

## 2023-06-26 MED ORDER — LETROZOLE 2.5 MG PO TABS
2.5000 mg | ORAL_TABLET | Freq: Every day | ORAL | 3 refills | Status: AC
Start: 2023-06-26 — End: ?

## 2023-06-26 NOTE — Progress Notes (Signed)
 Patient Care Team: Copland, Skipper Dumas, MD as PCP - General (Family Medicine) Audery Blazing Deannie Fabian, MD as PCP - Cardiology (Cardiology) Audery Blazing Deannie Fabian, MD as Consulting Physician (Cardiology) Johna Myers, MD as Consulting Physician (Radiation Oncology) Cameron Cea, MD as Consulting Physician (Hematology and Oncology) Caralyn Chandler, MD as Consulting Physician (General Surgery)  DIAGNOSIS:  Encounter Diagnosis  Name Primary?   Malignant neoplasm of upper-outer quadrant of right breast in female, estrogen receptor positive (HCC) Yes    SUMMARY OF ONCOLOGIC HISTORY: Oncology History  Malignant neoplasm of upper-outer quadrant of right breast in female, estrogen receptor positive (HCC)  04/25/2021 Initial Diagnosis   Screening mammogram detected right breast mass, by ultrasound measured 9 mm, biopsy revealed grade 1 IDC with low-grade DCIS, ER 95%, PR 90%, Ki-67 10%, HER2 2+ by IHC, FISH negative, ratio 1.17, copy #1.7   05/07/2021 Cancer Staging   Staging form: Breast, AJCC 8th Edition - Clinical stage from 05/07/2021: Stage IA (cT1b, cN0, cM0, G1, ER+, PR+, HER2-) - Signed by Cameron Cea, MD on 05/17/2021 Stage prefix: Initial diagnosis Method of lymph node assessment: Clinical Histologic grading system: 3 grade system   06/06/2021 Surgery   06/06/2021: Right lumpectomy: Grade 2 IDC 1.2 cm, margins negative, 0/4 lymph nodes negative, ER 95%, PR 90%, HER2 negative, Ki-67 10%   06/19/2021 Oncotype testing   Score 9 (ROR 3%)   07/04/2021 Cancer Staging   Staging form: Breast, AJCC 8th Edition - Pathologic stage from 07/04/2021: Stage IA (pT1c, pN0(sn), cM0, G2, ER+, PR+, HER2-, Oncotype DX score: 9) - Signed by Bettejane Brownie, PA-C on 07/04/2021 Stage prefix: Initial diagnosis Method of lymph node assessment: Sentinel lymph node biopsy Multigene prognostic tests performed: Oncotype DX Recurrence score range: Less than 11 Histologic grading system: 3 grade system   07/13/2021 -  08/09/2021 Radiation Therapy   Site Technique Total Dose (Gy) Dose per Fx (Gy) Completed Fx Beam Energies  Breast, Right: Breast_R 3D 42.56/42.56 2.66 16/16 6X  Breast, Right: Breast_R_Bst 3D 8/8 2 4/4 6X, 10X       CHIEF COMPLIANT: F/u on Letrozole   HISTORY OF PRESENT ILLNESS:  History of Present Illness Casey Huerta is a 68 year old female with breast cancer who presents for a routine follow-up visit.  She takes Letrozole  daily without difficulty, using her phone for reminders. She experiences hot flashes, attributing them to menopause. Her last mammogram in February showed good results, with dense breasts noted. She receives 3D mammograms to address this density.     ALLERGIES:  is allergic to simvastatin.  MEDICATIONS:  Current Outpatient Medications  Medication Sig Dispense Refill   ALPRAZolam  (XANAX ) 0.25 MG tablet Take 1 tablet (0.25 mg total) by mouth daily as needed for anxiety. 30 tablet 0   amLODipine  (NORVASC ) 5 MG tablet Take 1 tablet (5 mg total) by mouth daily. 90 tablet 3   aspirin  (EQ ASPIRIN  LOW DOSE) 81 MG chewable tablet Chew 1 tablet (81 mg total) by mouth daily. 90 tablet 0   atenolol (TENORMIN) 12.5 mg TABS tablet Atenolol     celecoxib  (CELEBREX ) 100 MG capsule TAKE 1 CAPSULE BY MOUTH TWICE DAILY AS NEEDED FOR JOINT PAIN 180 capsule 3   HYDROcodone -acetaminophen  (NORCO/VICODIN) 5-325 MG tablet Take 1 tablet by mouth every 12 (twelve) hours as needed for up to 30 doses for severe pain (pain score 7-10) or moderate pain (pain score 4-6). 30 tablet 0   letrozole  (FEMARA ) 2.5 MG tablet Take 1 tablet (2.5 mg total)  by mouth daily. 90 tablet 3   Multiple Vitamins-Minerals (MULTIVITAMIN WITH MINERALS) tablet Take 1 tablet by mouth daily.     naloxone  (NARCAN ) nasal spray 4 mg/0.1 mL One spray in nostril as needed for overdose - repeat in 4 minutes as needed until help arrives 1 each PRN   nitroGLYCERIN  (NITROSTAT ) 0.4 MG SL tablet Place 1 tablet (0.4 mg total)  under the tongue every 5 (five) minutes x 3 doses as needed for chest pain. 25 tablet 1   ondansetron  (ZOFRAN -ODT) 4 MG disintegrating tablet Take 4 mg by mouth every 8 (eight) hours as needed.     rosuvastatin  (CRESTOR ) 20 MG tablet Take 1 tablet (20 mg total) by mouth daily. 90 tablet 3   traMADol  (ULTRAM ) 50 MG tablet TAKE 1 TABLET BY MOUTH ONCE DAILY AT  BEDTIME  *  DO  NOT  MIX  WITH  XANAX   * 30 tablet 2   No current facility-administered medications for this visit.    PHYSICAL EXAMINATION: ECOG PERFORMANCE STATUS: 1 - Symptomatic but completely ambulatory  Vitals:   06/26/23 1429  BP: (!) 154/53  Pulse: 78  Resp: 18  Temp: 98.5 F (36.9 C)  SpO2: 99%   Filed Weights   06/26/23 1429  Weight: 135 lb 8 oz (61.5 kg)      LABORATORY DATA:  I have reviewed the data as listed    Latest Ref Rng & Units 04/30/2023    3:29 PM 10/16/2022    3:18 PM 08/29/2021    2:38 PM  CMP  Glucose 70 - 99 mg/dL 454  098  66   BUN 6 - 23 mg/dL 10  9  12    Creatinine 0.40 - 1.20 mg/dL 1.19  1.47  8.29   Sodium 135 - 145 mEq/L 140  143  142   Potassium 3.5 - 5.1 mEq/L 4.9  5.2  4.9   Chloride 96 - 112 mEq/L 106  105  105   CO2 19 - 32 mEq/L 26  29  29    Calcium  8.4 - 10.5 mg/dL 56.2  13.0  86.5   Total Protein 6.0 - 8.3 g/dL  7.1  7.4   Total Bilirubin 0.2 - 1.2 mg/dL  0.4  0.4   Alkaline Phos 39 - 117 U/L  81  85   AST 0 - 37 U/L  22  23   ALT 0 - 35 U/L  18  19     Lab Results  Component Value Date   WBC 4.0 04/30/2023   HGB 13.2 04/30/2023   HCT 40.4 04/30/2023   MCV 93.0 04/30/2023   PLT 227.0 04/30/2023   NEUTROABS 2.0 03/25/2017    ASSESSMENT & PLAN:  Malignant neoplasm of upper-outer quadrant of right breast in female, estrogen receptor positive (HCC) 04/25/2021:Screening mammogram detected right breast mass, by ultrasound measured 9 mm, biopsy revealed grade 1 IDC with low-grade DCIS, ER 95%, PR 90%, Ki-67 10%, HER2 2+ by IHC, FISH negative, ratio 1.17, copy #1.7    06/06/2021: Right lumpectomy: Grade 2 IDC 1.2 cm, margins negative, 0/4 lymph nodes negative, ER 95%, PR 90%, HER2 negative, Ki-67 10%   Adj XRT 07/13/21-08/09/21 Oncotype Dx: Score 9 (ROR 3%)   Treatment Plan: Adj Anti estrogen therapy with Letrozole  2.5 mg daily started July 2023   Letrozole  toxicities: Tolerating extremely well.  She has chronic osteoarthritis which causes aches and pains as well as occasional hot flashes.  This is her baseline.   Breast cancer surveillance:  Breast exam 06/26/2023: Benign Mammogram 03/24/2023: Benign breast density category C   Osteopenia: I discussed with her about calcium  and vitamin D  and weightbearing exercises. Return to clinic in 1 year for follow-up ------------------------------------- Assessment and Plan Assessment & Plan Malignant neoplasm of upper-outer quadrant of right breast Estrogen receptor positive breast cancer in the upper-outer quadrant of the right breast. She is on letrozole  with adherence and no significant side effects. February mammogram showed good results with dense breasts, but 3D mammography allows for adequate visualization. - Refilled letrozole  prescription and sent to her pharmacy.      No orders of the defined types were placed in this encounter.  The patient has a good understanding of the overall plan. she agrees with it. she will call with any problems that may develop before the next visit here. Total time spent: 30 mins including face to face time and time spent for planning, charting and co-ordination of care   Margert Sheerer, MD 06/26/23

## 2023-06-26 NOTE — Assessment & Plan Note (Signed)
 04/25/2021:Screening mammogram detected right breast mass, by ultrasound measured 9 mm, biopsy revealed grade 1 IDC with low-grade DCIS, ER 95%, PR 90%, Ki-67 10%, HER2 2+ by IHC, FISH negative, ratio 1.17, copy #1.7   06/06/2021: Right lumpectomy: Grade 2 IDC 1.2 cm, margins negative, 0/4 lymph nodes negative, ER 95%, PR 90%, HER2 negative, Ki-67 10%   Adj XRT 07/13/21-08/09/21 Oncotype Dx: Score 9 (ROR 3%)   Treatment Plan: Adj Anti estrogen therapy with Letrozole  2.5 mg daily started July 2023   Letrozole  toxicities: Tolerating extremely well.  She has chronic osteoarthritis which causes aches and pains as well as occasional hot flashes.  This is her baseline.   Breast cancer surveillance: Breast exam 06/26/2023: Benign Mammogram 03/24/2023: Benign breast density category C   Osteopenia: I discussed with her about calcium  and vitamin D  and weightbearing exercises. Return to clinic in 1 year for follow-up

## 2023-07-15 ENCOUNTER — Encounter: Payer: Self-pay | Admitting: Family Medicine

## 2023-07-17 ENCOUNTER — Ambulatory Visit

## 2023-07-17 VITALS — BP 154/53 | Ht 62.0 in | Wt 135.0 lb

## 2023-07-17 DIAGNOSIS — Z2821 Immunization not carried out because of patient refusal: Secondary | ICD-10-CM

## 2023-07-17 DIAGNOSIS — Z0000 Encounter for general adult medical examination without abnormal findings: Secondary | ICD-10-CM | POA: Diagnosis not present

## 2023-07-17 NOTE — Patient Instructions (Signed)
 Casey Huerta , Thank you for taking time out of your busy schedule to complete your Annual Wellness Visit with me. I enjoyed our conversation and look forward to speaking with you again next year. I, as well as your care team,  appreciate your ongoing commitment to your health goals. Please review the following plan we discussed and let me know if I can assist you in the future. Your Game plan/ To Do List    Referrals: If you haven't heard from the office you've been referred to, please reach out to them at the phone provided.   Follow up Visits: Next Medicare AWV with our clinical staff: 07/21/2024   Have you seen your provider in the last 6 months (3 months if uncontrolled diabetes)? No Next Office Visit with your provider:   Clinician Recommendations:  Aim for 30 minutes of exercise or brisk walking, 6-8 glasses of water, and 5 servings of fruits and vegetables each day.       This is a list of the screening recommended for you and due dates:  Health Maintenance  Topic Date Due   COVID-19 Vaccine (7 - Pfizer risk 2024-25 season) 05/07/2023   Flu Shot  09/19/2023   DEXA scan (bone density measurement)  11/29/2023   Cologuard (Stool DNA test)  03/12/2024   Mammogram  03/23/2024   Medicare Annual Wellness Visit  07/16/2024   DTaP/Tdap/Td vaccine (3 - Td or Tdap) 04/12/2029   Pneumonia Vaccine  Completed   Hepatitis C Screening  Completed   Zoster (Shingles) Vaccine  Completed   HPV Vaccine  Aged Out   Meningitis B Vaccine  Aged Out    Advanced directives: (Declined) Advance directive discussed with you today. Even though you declined this today, please call our office should you change your mind, and we can give you the proper paperwork for you to fill out. Advance Care Planning is important because it:  [x]  Makes sure you receive the medical care that is consistent with your values, goals, and preferences  [x]  It provides guidance to your family and loved ones and reduces their  decisional burden about whether or not they are making the right decisions based on your wishes.  Follow the link provided in your after visit summary or read over the paperwork we have mailed to you to help you started getting your Advance Directives in place. If you need assistance in completing these, please reach out to us  so that we can help you!  See attachments for Preventive Care and Fall Prevention Tips.

## 2023-07-17 NOTE — Progress Notes (Signed)
 Because this visit was a virtual/telehealth visit,  certain criteria was not obtained, such a blood pressure, CBG if applicable, and timed get up and go. Any medications not marked as "taking" were not mentioned during the medication reconciliation part of the visit. Any vitals not documented were not able to be obtained due to this being a telehealth visit or patient was unable to self-report a recent blood pressure reading due to a lack of equipment at home via telehealth. Vitals that have been documented are verbally provided by the patient.   This visit was performed by a medical professional under my direct supervision. I was immediately available for consultation/collaboration. I have reviewed and agree with the Annual Wellness Visit documentation.  Subjective:   Casey Huerta is a 68 y.o. who presents for a Medicare Wellness preventive visit.  As a reminder, Annual Wellness Visits don't include a physical exam, and some assessments may be limited, especially if this visit is performed virtually. We may recommend an in-person follow-up visit with your provider if needed.  Visit Complete: Virtual I connected with  Casey Huerta on 07/17/23 by a audio enabled telemedicine application and verified that I am speaking with the correct person using two identifiers.  Patient Location: Home  Provider Location: Home Office  I discussed the limitations of evaluation and management by telemedicine. The patient expressed understanding and agreed to proceed.  Vital Signs: Because this visit was a virtual/telehealth visit, some criteria may be missing or patient reported. Any vitals not documented were not able to be obtained and vitals that have been documented are patient reported.  VideoDeclined- This patient declined Librarian, academic. Therefore the visit was completed with audio only.  Persons Participating in Visit: Patient.  AWV Questionnaire: No: Patient  Medicare AWV questionnaire was not completed prior to this visit.  Cardiac Risk Factors include: advanced age (>70men, >51 women);female gender     Objective:     Today's Vitals   07/17/23 1436 07/17/23 1437  BP: (!) 154/53   Weight: 135 lb (61.2 kg)   Height: 5\' 2"  (1.575 m)   PainSc:  0-No pain   Body mass index is 24.69 kg/m.     07/17/2023    2:36 PM 05/30/2022    1:54 PM 08/14/2021    9:58 AM 07/03/2021    2:09 PM 06/15/2021   12:14 PM 06/06/2021    9:55 AM 05/18/2021   11:08 AM  Advanced Directives  Does Patient Have a Medical Advance Directive? No No No No No No No  Would patient like information on creating a medical advance directive? No - Patient declined No - Patient declined No - Patient declined  No - Patient declined No - Patient declined No - Patient declined    Current Medications (verified) Outpatient Encounter Medications as of 07/17/2023  Medication Sig   ALPRAZolam  (XANAX ) 0.25 MG tablet Take 1 tablet (0.25 mg total) by mouth daily as needed for anxiety.   amLODipine  (NORVASC ) 5 MG tablet Take 1 tablet (5 mg total) by mouth daily.   aspirin  (EQ ASPIRIN  LOW DOSE) 81 MG chewable tablet Chew 1 tablet (81 mg total) by mouth daily.   atenolol (TENORMIN) 12.5 mg TABS tablet Atenolol   celecoxib  (CELEBREX ) 100 MG capsule TAKE 1 CAPSULE BY MOUTH TWICE DAILY AS NEEDED FOR JOINT PAIN   HYDROcodone -acetaminophen  (NORCO/VICODIN) 5-325 MG tablet Take 1 tablet by mouth every 12 (twelve) hours as needed for up to 30 doses for severe pain (pain  score 7-10) or moderate pain (pain score 4-6).   letrozole  (FEMARA ) 2.5 MG tablet Take 1 tablet (2.5 mg total) by mouth daily.   Multiple Vitamins-Minerals (MULTIVITAMIN WITH MINERALS) tablet Take 1 tablet by mouth daily.   naloxone  (NARCAN ) nasal spray 4 mg/0.1 mL One spray in nostril as needed for overdose - repeat in 4 minutes as needed until help arrives   nitroGLYCERIN  (NITROSTAT ) 0.4 MG SL tablet Place 1 tablet (0.4 mg total) under  the tongue every 5 (five) minutes x 3 doses as needed for chest pain.   ondansetron  (ZOFRAN -ODT) 4 MG disintegrating tablet Take 4 mg by mouth every 8 (eight) hours as needed.   rosuvastatin  (CRESTOR ) 20 MG tablet Take 1 tablet (20 mg total) by mouth daily.   traMADol  (ULTRAM ) 50 MG tablet TAKE 1 TABLET BY MOUTH ONCE DAILY AT  BEDTIME  *  DO  NOT  MIX  WITH  XANAX   *   No facility-administered encounter medications on file as of 07/17/2023.    Allergies (verified) Simvastatin   History: Past Medical History:  Diagnosis Date   Aortic insufficiency    a. Mild-mod AI by echo 2010.   Arthritis    Breast cancer (HCC) 04/25/2021   Breast mass 05/17/2016   BIRADS 3: biopsy revealed benign fibroadenoma   COPD    CORONARY ARTERY DISEASE    a. CABG (LIMA-->LAD 2001). b. Myoview 2008 no ischemia, EF 63%.  c. cath 10/14/2013 mild 20% ost LAD, atretic LIMA, EF 55%, medical therapy   Family history of leukemia    Family history of ovarian cancer    Family history of prostate cancer    Hematemesis 09/2018   Hyperlipidemia    MENORRHAGIA    Unspecified essential hypertension    Past Surgical History:  Procedure Laterality Date   arthroscopic knee surgery     BIOPSY  09/23/2018   Procedure: BIOPSY;  Surgeon: Tami Falcon, MD;  [facility] END[facility]COPY;  Service: Endoscopy;;   BREAST BIOPSY Right 05/28/2016   HYALINIZED FIBROADENOMA   BREAST LUMPECTOMY Right 06/06/2021   BREAST LUMPECTOMY WITH RADIOACTIVE SEED AND SENTINEL LYMPH NODE BIOPSY Right 06/06/2021   Procedure: RIGHT BREAST LUMPECTOMY WITH RADIOACTIVE SEED AND SENTINEL LYMPH NODE BIOPSY;  Surgeon: Caralyn Chandler, MD;  Location: [facility];  Service: General;  Laterality: Right;   CESAREAN SECTION     CORONARY ARTERY BYPASS GRAFT  2001   ESOPHAGOGASTRODUODENOSCOPY (EGD) WITH PROPOFOL  Left 09/23/2018   Procedure: ESOPHAGOGASTRODUODENOSCOPY (EGD) WITH PROPOFOL ;  Surgeon: Tami Falcon, MD;  Location: [facility] ENDOSCOPY;  Service:  Endoscopy;  Laterality: Left;   LEFT HEART CATH AND CORONARY ANGIOGRAPHY N/A 01/17/2017   Procedure: LEFT HEART CATH AND CORONARY ANGIOGRAPHY;  Surgeon: Arty Binning, MD;  Location: [facility] INVASIVE CV LAB;  Service: Cardiovascular;  Laterality: N/A;   LEFT HEART CATHETERIZATION WITH CORONARY/GRAFT ANGIOGRAM N/A 10/12/2013   Procedure: LEFT HEART CATHETERIZATION WITH Estella Helling;  Surgeon: Millicent Ally, MD;  Location: [facility] CATH LAB;  Service: Cardiovascular;  Laterality: N/A;   Family History  Problem Relation Age of Onset   Heart disease Mother    Cancer Mother        possibly ovarian cancer   Lung cancer Father    Heart disease Sister    Ovarian cancer Sister    Cancer - Prostate Brother 49   Leukemia Niece        great niece died of leukemia at 24   Social History   Socioeconomic History  Marital status: Married    Spouse name: Not on file   Number of children: Not on file   Years of education: Not on file   Highest education level: 12th grade  Occupational History   Not on file  Tobacco Use   Smoking status: Former    Current packs/day: 0.00    Types: Cigarettes    Quit date: 1980    Years since quitting: 45.4   Smokeless tobacco: Never   Tobacco comments:    QUIT SMOKING IN 1999  Vaping Use   Vaping status: Never Used  Substance and Sexual Activity   Alcohol use: Yes    Comment: friday nights - 2 glasses - rare   Drug use: No    Comment:  quit using in 1985   Sexual activity: Yes    Partners: Male  Other Topics Concern   Not on file  Social History Narrative   Marital status: married   Children: 3 chidlren and 16 grandchildren and 1 great grandchild   Lives with: husband   Employment: customer service [employer]   Tobacco:  quit   Alcohol:  Friday nights   Drugs:  none   Exercise:  no   Seatbelt: 100%   Guns in home: rifle       Secured: yes   Social Drivers of Corporate investment banker Strain: Low Risk  (07/17/2023)   Overall  Financial Resource Strain (CARDIA)    Difficulty of Paying Living Expenses: Not hard at all  Food Insecurity: No Food Insecurity (07/17/2023)   Hunger Vital Sign    Worried About Running Out of Food in the Last Year: Never true    Ran Out of Food in the Last Year: Never true  Transportation Needs: No Transportation Needs (07/17/2023)   PRAPARE - Administrator, Civil Service (Medical): No    Lack of Transportation (Non-Medical): No  Physical Activity: Unknown (07/17/2023)   Exercise Vital Sign    Days of Exercise per Week: 3 days    Minutes of Exercise per Session: Not on file  Stress: No Stress Concern Present (07/17/2023)   [employer] of Occupational Health - Occupational Stress Questionnaire    Feeling of Stress : Not at all  Social Connections: Socially Integrated (07/17/2023)   Social Connection and Isolation Panel [NHANES]    Frequency of Communication with Friends and Family: More than three times a week    Frequency of Social Gatherings with Friends and Family: Once a week    Attends Religious Services: More than 4 times per year    Active Member of [employer] or Organizations: Yes    Attends Engineer, structural: More than 4 times per year    Marital Status: Married    Tobacco Counseling Counseling given: Not Answered Tobacco comments: QUIT SMOKING IN 1999    Clinical Intake:  Pre-visit preparation completed: Yes  Pain : No/denies pain Pain Score: 0-No pain     BMI - recorded: 24.69 Nutritional Status: BMI 25 -29 Overweight Nutritional Risks: None Diabetes: No  Lab Results  Component Value Date   HGBA1C 5.9 10/16/2022   HGBA1C 6.1 03/05/2021   HGBA1C 5.7 02/24/2020     How often do you need to have someone help you when you read instructions, pamphlets, or other written materials from your doctor or pharmacy?: 1 - Never What is the last grade level you completed in school?: some college     Information entered by :: [employer]  Amaal Dimartino,CMA   Activities of Daily Living     07/17/2023    2:42 PM  In your present state of health, do you have any difficulty performing the following activities:  Hearing? 0  Vision? 0  Difficulty concentrating or making decisions? 1  Walking or climbing stairs? 0  Dressing or bathing? 0  Doing errands, shopping? 0  Preparing Food and eating ? N  Using the Toilet? N  In the past six months, have you accidently leaked urine? N  Do you have problems with loss of bowel control? N  Managing your Medications? N  Managing your Finances? N  Housekeeping or managing your Housekeeping? N    Patient Care Team: Copland, Skipper Dumas, MD as PCP - General (Family Medicine) Audery Blazing Deannie Fabian, MD as PCP - Cardiology (Cardiology) Audery Blazing Deannie Fabian, MD as Consulting Physician (Cardiology) Johna Myers, MD as Consulting Physician (Radiation Oncology) Cameron Cea, MD as Consulting Physician (Hematology and Oncology) Caralyn Chandler, MD as Consulting Physician (General Surgery)  Indicate any recent Medical Services you may have received from other than [facility] providers in the past year (date may be approximate).     Assessment:    This is a routine wellness examination for [place].  Hearing/Vision screen Hearing Screening - Comments:: No difficulties hearing  Vision Screening - Comments:: Patient wears glasses    Goals Addressed               This Visit's Progress     Patient Stated (pt-stated)        Patient would like to take a trip with her 2 sisters        Depression Screen     07/17/2023    2:43 PM 08/29/2021    2:06 PM 07/05/2020    1:44 PM 12/16/2017    3:31 PM 03/25/2017    3:50 PM 06/14/2016    1:50 PM 01/20/2016    7:59 AM  PHQ 2/9 Scores  PHQ - 2 Score 0 0 0 0 0 0 0  PHQ- 9 Score 0          Fall Risk     07/17/2023    2:40 PM 08/29/2021    2:06 PM 07/05/2020    1:45 PM 12/16/2017    3:31 PM 03/25/2017    3:50 PM  Fall Risk   Falls in the past year? 0 0 0 No No   Number falls in past yr: 0 0     Injury with Fall? 0 0     Risk for fall due to : No Fall Risks      Follow up Falls evaluation completed        MEDICARE RISK AT HOME:  Medicare Risk at Home Any stairs in or around the home?: Yes If so, are there any without handrails?: No Home free of loose throw rugs in walkways, pet beds, electrical cords, etc?: Yes Adequate lighting in your home to reduce risk of falls?: Yes Life alert?: No Use of a cane, walker or w/c?: No Grab bars in the bathroom?: No Shower chair or bench in shower?: No Elevated toilet seat or a handicapped toilet?: No  TIMED UP AND GO:  Was the test performed?  No  Cognitive Function: 6CIT completed        07/17/2023    2:38 PM  6CIT Screen  What Year? 0 points  What month? 0 points  What time? 0 points  Count back from 20 0 points  Months  in reverse 0 points  Repeat phrase 0 points  Total Score 0 points    Immunizations Immunization History  Administered Date(s) Administered   Fluad Trivalent(High Dose 65+) 10/16/2022   Hep A / Hep B 12/07/2022   Influenza, Seasonal, Injecte, Preservative Fre 11/20/2013, 10/20/2015, 10/07/2018   Influenza-Unspecified 11/20/2013, 10/20/2015, 11/22/2019, 01/07/2021, 07/27/2021   Moderna Covid-19 Fall Seasonal Vaccine 62yrs & older 11/07/2022   PFIZER(Purple Top)SARS-COV-2 Vaccination 05/06/2019, 06/01/2019, 11/22/2019, 05/29/2020   PNEUMOCOCCAL CONJUGATE-20 10/16/2022   Pfizer Covid-19 Vaccine Bivalent Booster 32yrs & up 01/07/2021   Pneumococcal Conjugate-13 12/07/2013, 12/01/2014   Pneumococcal Polysaccharide-23 10/13/2013   Tdap 01/20/2016, 04/13/2019   Zoster Recombinant(Shingrix) 12/28/2018, 04/13/2019, 02/24/2020    Screening Tests Health Maintenance  Topic Date Due   COVID-19 Vaccine (7 - Pfizer risk 2024-25 season) 05/07/2023   INFLUENZA VACCINE  09/19/2023   DEXA SCAN  11/29/2023   Fecal DNA (Cologuard)  03/12/2024   MAMMOGRAM  03/23/2024   Medicare  Annual Wellness (AWV)  07/16/2024   DTaP/Tdap/Td (3 - Td or Tdap) 04/12/2029   Pneumonia Vaccine 61+ Years old  Completed   Hepatitis C Screening  Completed   Zoster Vaccines- Shingrix  Completed   HPV VACCINES  Aged Out   Meningococcal B Vaccine  Aged Out    Health Maintenance  Health Maintenance Due  Topic Date Due   COVID-19 Vaccine (7 - Pfizer risk 2024-25 season) 05/07/2023   Health Maintenance Items Addressed:   Additional Screening:  Vision Screening: Recommended annual ophthalmology exams for early detection of glaucoma and other disorders of the eye.  Dental Screening: Recommended annual dental exams for proper oral hygiene  Community Resource Referral / Chronic Care Management: CRR required this visit?  No   CCM required this visit?  No   Plan:    I have personally reviewed and noted the following in the patient's chart:   Medical and social history Use of alcohol, tobacco or illicit drugs  Current medications and supplements including opioid prescriptions. Patient is not currently taking opioid prescriptions. Functional ability and status Nutritional status Physical activity Advanced directives List of other physicians Hospitalizations, surgeries, and ER visits in previous 12 months Vitals Screenings to include cognitive, depression, and falls Referrals and appointments  In addition, I have reviewed and discussed with patient certain preventive protocols, quality metrics, and best practice recommendations. A written personalized care plan for preventive services as well as general preventive health recommendations were provided to patient.   Freeda Jerry, [place]   07/17/2023   After Visit Summary: (MyChart) Due to this being a telephonic visit, the after visit summary with patients personalized plan was offered to patient via MyChart   Notes: Nothing significant to report at this time.

## 2023-07-18 ENCOUNTER — Encounter: Payer: Self-pay | Admitting: Family Medicine

## 2023-07-18 DIAGNOSIS — M25562 Pain in left knee: Secondary | ICD-10-CM

## 2023-07-18 DIAGNOSIS — G8929 Other chronic pain: Secondary | ICD-10-CM

## 2023-07-18 DIAGNOSIS — M25512 Pain in left shoulder: Secondary | ICD-10-CM

## 2023-07-18 MED ORDER — HYDROCODONE-ACETAMINOPHEN 5-325 MG PO TABS: 1.0000 | ORAL_TABLET | Freq: Two times a day (BID) | ORAL | 0 refills | Status: DC | PRN

## 2023-07-22 DIAGNOSIS — M1990 Unspecified osteoarthritis, unspecified site: Secondary | ICD-10-CM | POA: Diagnosis not present

## 2023-07-22 DIAGNOSIS — G8929 Other chronic pain: Secondary | ICD-10-CM | POA: Diagnosis not present

## 2023-07-22 DIAGNOSIS — J449 Chronic obstructive pulmonary disease, unspecified: Secondary | ICD-10-CM | POA: Diagnosis not present

## 2023-08-15 ENCOUNTER — Encounter: Payer: Self-pay | Admitting: Family Medicine

## 2023-08-15 DIAGNOSIS — M25562 Pain in left knee: Secondary | ICD-10-CM

## 2023-08-15 DIAGNOSIS — G8929 Other chronic pain: Secondary | ICD-10-CM

## 2023-08-15 DIAGNOSIS — M25512 Pain in left shoulder: Secondary | ICD-10-CM

## 2023-08-15 MED ORDER — HYDROCODONE-ACETAMINOPHEN 5-325 MG PO TABS: 1.0000 | ORAL_TABLET | Freq: Two times a day (BID) | ORAL | 0 refills | Status: DC | PRN

## 2023-08-21 DIAGNOSIS — J449 Chronic obstructive pulmonary disease, unspecified: Secondary | ICD-10-CM | POA: Diagnosis not present

## 2023-08-21 DIAGNOSIS — M1990 Unspecified osteoarthritis, unspecified site: Secondary | ICD-10-CM | POA: Diagnosis not present

## 2023-08-21 DIAGNOSIS — G8929 Other chronic pain: Secondary | ICD-10-CM | POA: Diagnosis not present

## 2023-09-12 ENCOUNTER — Telehealth: Payer: Self-pay | Admitting: Pharmacist

## 2023-09-12 NOTE — Telephone Encounter (Signed)
 Patient should be due to have rosuvastatin  refilled. Unable to reach her to discuss by phone. LM on VM wth CB# [phone] or [phone]. Will also sent My Chart message.

## 2023-09-12 NOTE — Telephone Encounter (Signed)
 Patient called back. She report she has a full bottle of rosuvastatin  and has been taking it every day. She does not currently need a refill.  Casey Huerta reports she is set up for auto refill thru [facility].  She also had a question about amlodipine . She asked how long it was good for - she has 9 bottles of 30 tablets from manufacturer. I told her they would be good until date on the bottle which was 2026 date. Encouraged her to ask [facility] to suspend automatic refills at least for amlodipine  since she has so much on hand.

## 2023-09-12 NOTE — Progress Notes (Signed)
 Pharmacy Quality Measure Review  This patient is appearing on a report for being at risk of failing the adherence measure for cholesterol (statin) medications this calendar year.   Medication: rosuvastatin   Last fill date: 04/14/2023 for 90 day supply  Reviewed her med list - there was a new Rx for 90 day supply sent to [facility] 04/30/2023 with 3 refills. Patient should have refill remaining.   Left voicemail for patient to return my call at their convenience. and MyChart message sent to patient.  Madelin Ray, PharmD Clinical Pharmacist [facility] Primary Care  Population Health [phone]

## 2023-09-15 ENCOUNTER — Encounter: Payer: Self-pay | Admitting: Family Medicine

## 2023-09-15 DIAGNOSIS — M25412 Effusion, left shoulder: Secondary | ICD-10-CM

## 2023-09-15 DIAGNOSIS — G8929 Other chronic pain: Secondary | ICD-10-CM

## 2023-09-15 DIAGNOSIS — M25562 Pain in left knee: Secondary | ICD-10-CM

## 2023-09-15 MED ORDER — HYDROCODONE-ACETAMINOPHEN 5-325 MG PO TABS: 1.0000 | ORAL_TABLET | Freq: Two times a day (BID) | ORAL | 0 refills | Status: DC | PRN

## 2023-09-21 DIAGNOSIS — G8929 Other chronic pain: Secondary | ICD-10-CM | POA: Diagnosis not present

## 2023-09-21 DIAGNOSIS — J449 Chronic obstructive pulmonary disease, unspecified: Secondary | ICD-10-CM | POA: Diagnosis not present

## 2023-09-21 DIAGNOSIS — M1990 Unspecified osteoarthritis, unspecified site: Secondary | ICD-10-CM | POA: Diagnosis not present

## 2023-10-16 ENCOUNTER — Encounter: Payer: Self-pay | Admitting: Family Medicine

## 2023-10-16 DIAGNOSIS — G8929 Other chronic pain: Secondary | ICD-10-CM

## 2023-10-16 DIAGNOSIS — M25562 Pain in left knee: Secondary | ICD-10-CM

## 2023-10-16 DIAGNOSIS — M25412 Effusion, left shoulder: Secondary | ICD-10-CM

## 2023-10-16 MED ORDER — HYDROCODONE-ACETAMINOPHEN 5-325 MG PO TABS: 1.0000 | ORAL_TABLET | Freq: Two times a day (BID) | ORAL | 0 refills | Status: DC | PRN

## 2023-10-21 ENCOUNTER — Other Ambulatory Visit: Payer: Self-pay | Admitting: Family Medicine

## 2023-10-21 ENCOUNTER — Encounter: Payer: Self-pay | Admitting: Family Medicine

## 2023-10-21 DIAGNOSIS — G8929 Other chronic pain: Secondary | ICD-10-CM

## 2023-10-21 DIAGNOSIS — M25562 Pain in left knee: Secondary | ICD-10-CM

## 2023-10-21 NOTE — Progress Notes (Signed)
 HPI: FU coronary artery disease. She is status post coronary artery bypass and graft in 2001. Abdominal CT September 2016 showed no aneurysm.  Cardiac catheterization November 2018 showed 20 to 25% LAD and normal LV function.  LIMA to the LAD occluded proximally. Echocardiogram June 2023 showed normal LV function, grade 1 diastolic dysfunction, moderate aortic insufficiency. Since last seen, she denies dyspnea, chest pain, palpitations or syncope.  Current Outpatient Medications  Medication Sig Dispense Refill   ALPRAZolam  (XANAX ) 0.25 MG tablet Take 1 tablet (0.25 mg total) by mouth daily as needed for anxiety. 30 tablet 0   amLODipine  (NORVASC ) 5 MG tablet Take 1 tablet (5 mg total) by mouth daily. 90 tablet 3   aspirin  (EQ ASPIRIN  LOW DOSE) 81 MG chewable tablet Chew 1 tablet (81 mg total) by mouth daily. 90 tablet 0   atenolol (TENORMIN) 12.5 mg TABS tablet Atenolol     celecoxib  (CELEBREX ) 100 MG capsule TAKE 1 CAPSULE BY MOUTH TWICE DAILY AS NEEDED FOR JOINT PAIN 180 capsule 3   COVID-19 mRNA vaccine, Pfizer, (COMIRNATY) syringe Inject 0.3 mLs into the muscle once for 1 dose. 0.3 mL 0   HYDROcodone -acetaminophen  (NORCO/VICODIN) 5-325 MG tablet Take 1 tablet by mouth every 12 (twelve) hours as needed for up to 30 doses for severe pain (pain score 7-10) or moderate pain (pain score 4-6). 30 tablet 0   letrozole  (FEMARA ) 2.5 MG tablet Take 1 tablet (2.5 mg total) by mouth daily. 90 tablet 3   Multiple Vitamins-Minerals (MULTIVITAMIN WITH MINERALS) tablet Take 1 tablet by mouth daily.     naloxone  (NARCAN ) nasal spray 4 mg/0.1 mL One spray in nostril as needed for overdose - repeat in 4 minutes as needed until help arrives 1 each PRN   nitroGLYCERIN  (NITROSTAT ) 0.4 MG SL tablet Place 1 tablet (0.4 mg total) under the tongue every 5 (five) minutes x 3 doses as needed for chest pain. 25 tablet 1   ondansetron  (ZOFRAN -ODT) 4 MG disintegrating tablet Take 4 mg by mouth every 8 (eight) hours as  needed.     rosuvastatin  (CRESTOR ) 20 MG tablet Take 1 tablet (20 mg total) by mouth daily. 90 tablet 3   traMADol  (ULTRAM ) 50 MG tablet TAKE 1 TABLET BY MOUTH ONCE DAILY AT BEDTIME DO  NOT  MIX  WITH  XANAX  30 tablet 2   No current facility-administered medications for this visit.     Past Medical History:  Diagnosis Date   Aortic insufficiency    a. Mild-mod AI by echo 2010.   Arthritis    Breast cancer (HCC) 04/25/2021   Breast mass 05/17/2016   BIRADS 3: biopsy revealed benign fibroadenoma   COPD    CORONARY ARTERY DISEASE    a. CABG (LIMA-->LAD 2001). b. Myoview 2008 no ischemia, EF 63%.  c. cath 10/14/2013 mild 20% ost LAD, atretic LIMA, EF 55%, medical therapy   Family history of leukemia    Family history of ovarian cancer    Family history of prostate cancer    Hematemesis 09/2018   Hyperlipidemia    MENORRHAGIA    Unspecified essential hypertension     Past Surgical History:  Procedure Laterality Date   arthroscopic knee surgery     BIOPSY  09/23/2018   Procedure: BIOPSY;  Surgeon: Kristie Lamprey, MD;  Location: [facility] ENDOSCOPY;  Service: Endoscopy;;   BREAST BIOPSY Right 05/28/2016   HYALINIZED FIBROADENOMA   BREAST LUMPECTOMY Right 06/06/2021   BREAST LUMPECTOMY WITH RADIOACTIVE SEED AND SENTINEL  LYMPH NODE BIOPSY Right 06/06/2021   Procedure: RIGHT BREAST LUMPECTOMY WITH RADIOACTIVE SEED AND SENTINEL LYMPH NODE BIOPSY;  Surgeon: Curvin Deward MOULD, MD;  Location: [facility];  Service: General;  Laterality: Right;   CESAREAN SECTION     CORONARY ARTERY BYPASS GRAFT  2001   ESOPHAGOGASTRODUODENOSCOPY (EGD) WITH PROPOFOL  Left 09/23/2018   Procedure: ESOPHAGOGASTRODUODENOSCOPY (EGD) WITH PROPOFOL ;  Surgeon: Kristie Lamprey, MD;  Location: [facility] ENDOSCOPY;  Service: Endoscopy;  Laterality: Left;   LEFT HEART CATH AND CORONARY ANGIOGRAPHY N/A 01/17/2017   Procedure: LEFT HEART CATH AND CORONARY ANGIOGRAPHY;  Surgeon: Claudene Victory ORN, MD;  Location: [facility] INVASIVE CV LAB;   Service: Cardiovascular;  Laterality: N/A;   LEFT HEART CATHETERIZATION WITH CORONARY/GRAFT ANGIOGRAM N/A 10/12/2013   Procedure: LEFT HEART CATHETERIZATION WITH EL BILE;  Surgeon: Debby DELENA Sor, MD;  Location: [facility] CATH LAB;  Service: Cardiovascular;  Laterality: N/A;    Social History   Socioeconomic History   Marital status: Married    Spouse name: Not on file   Number of children: Not on file   Years of education: Not on file   Highest education level: 12th grade  Occupational History   Not on file  Tobacco Use   Smoking status: Former    Current packs/day: 0.00    Types: Cigarettes    Quit date: 1980    Years since quitting: 45.7   Smokeless tobacco: Never   Tobacco comments:    QUIT SMOKING IN 1999  Vaping Use   Vaping status: Never Used  Substance and Sexual Activity   Alcohol use: Yes    Comment: friday nights - 2 glasses - rare   Drug use: No    Comment:  quit using in 1985   Sexual activity: Yes    Partners: Male  Other Topics Concern   Not on file  Social History Narrative   Marital status: married   Children: 3 chidlren and 16 grandchildren and 1 great grandchild   Lives with: husband   Employment: customer service [employer]   Tobacco:  quit   Alcohol:  Friday nights   Drugs:  none   Exercise:  no   Seatbelt: 100%   Guns in home: rifle       Secured: yes   Social Drivers of Corporate investment banker Strain: Low Risk  (07/17/2023)   Overall Financial Resource Strain (CARDIA)    Difficulty of Paying Living Expenses: Not hard at all  Food Insecurity: No Food Insecurity (07/17/2023)   Hunger Vital Sign    Worried About Running Out of Food in the Last Year: Never true    Ran Out of Food in the Last Year: Never true  Transportation Needs: No Transportation Needs (07/17/2023)   PRAPARE - Administrator, Civil Service (Medical): No    Lack of Transportation (Non-Medical): No  Physical Activity: Unknown (07/17/2023)    Exercise Vital Sign    Days of Exercise per Week: 3 days    Minutes of Exercise per Session: Not on file  Stress: No Stress Concern Present (07/17/2023)   [employer] of Occupational Health - Occupational Stress Questionnaire    Feeling of Stress : Not at all  Social Connections: Socially Integrated (07/17/2023)   Social Connection and Isolation Panel    Frequency of Communication with Friends and Family: More than three times a week    Frequency of Social Gatherings with Friends and Family: Once a week    Attends  Religious Services: More than 4 times per year    Active Member of Clubs or Organizations: Yes    Attends Banker Meetings: More than 4 times per year    Marital Status: Married  Catering manager Violence: Not At Risk (07/17/2023)   Humiliation, Afraid, Rape, and Kick questionnaire    Fear of Current or Ex-Partner: No    Emotionally Abused: No    Physically Abused: No    Sexually Abused: No    Family History  Problem Relation Age of Onset   Heart disease Mother    Cancer Mother        possibly ovarian cancer   Lung cancer Father    Heart disease Sister    Ovarian cancer Sister    Cancer - Prostate Brother 87   Leukemia Niece        great niece died of leukemia at 21    ROS: no fevers or chills, productive cough, hemoptysis, dysphasia, odynophagia, melena, hematochezia, dysuria, hematuria, rash, seizure activity, orthopnea, PND, pedal edema, claudication. Remaining systems are negative.  Physical Exam: Well-developed well-nourished in no acute distress.  Skin is warm and dry.  HEENT is normal.  Neck is supple.  Chest is clear to auscultation with normal expansion.  Cardiovascular exam is regular rate and rhythm.  Abdominal exam nontender or distended. No masses palpated. Extremities show no edema. neuro grossly intact  EKG Interpretation Date/Time:  Friday October 31 2023 14:18:30 EDT Ventricular Rate:  78 PR Interval:    QRS  Duration:  80 QT Interval:  414 QTC Calculation: 471 R Axis:   49  Text Interpretation: NSR with PVC Possible Anterior infarct , age undetermined Confirmed by Pietro Rogue ([id number]) on 10/31/2023 2:20:45 PM    A/P  1 coronary artery disease-patient denies chest pain.  Continue aspirin  and statin.  2 aortic insufficiency-moderate on previous echocardiogram.  Will repeat study.  3 hypertension-patient's blood pressure is controlled.  Continue present medical regimen.  Check potassium and renal function.  4 hyperlipidemia-continue statin.  Check lipids and liver.  Rogue Pietro, MD

## 2023-10-22 DIAGNOSIS — M1990 Unspecified osteoarthritis, unspecified site: Secondary | ICD-10-CM | POA: Diagnosis not present

## 2023-10-22 DIAGNOSIS — G8929 Other chronic pain: Secondary | ICD-10-CM | POA: Diagnosis not present

## 2023-10-22 DIAGNOSIS — J449 Chronic obstructive pulmonary disease, unspecified: Secondary | ICD-10-CM | POA: Diagnosis not present

## 2023-10-23 ENCOUNTER — Other Ambulatory Visit: Payer: Self-pay | Admitting: Family Medicine

## 2023-10-30 ENCOUNTER — Encounter: Payer: Self-pay | Admitting: Family Medicine

## 2023-10-30 ENCOUNTER — Other Ambulatory Visit: Payer: Self-pay | Admitting: Family Medicine

## 2023-10-31 ENCOUNTER — Ambulatory Visit: Attending: Cardiology | Admitting: Cardiology

## 2023-10-31 ENCOUNTER — Encounter: Payer: Self-pay | Admitting: Family Medicine

## 2023-10-31 ENCOUNTER — Encounter: Payer: Self-pay | Admitting: Cardiology

## 2023-10-31 VITALS — BP 140/58 | HR 78 | Ht 62.0 in | Wt 137.0 lb

## 2023-10-31 DIAGNOSIS — I10 Essential (primary) hypertension: Secondary | ICD-10-CM

## 2023-10-31 DIAGNOSIS — I351 Nonrheumatic aortic (valve) insufficiency: Secondary | ICD-10-CM | POA: Diagnosis not present

## 2023-10-31 DIAGNOSIS — E785 Hyperlipidemia, unspecified: Secondary | ICD-10-CM

## 2023-10-31 DIAGNOSIS — Z951 Presence of aortocoronary bypass graft: Secondary | ICD-10-CM

## 2023-10-31 MED ORDER — COVID-19 MRNA VAC-TRIS(PFIZER) 30 MCG/0.3ML IM SUSY: 0.3000 mL | PREFILLED_SYRINGE | Freq: Once | INTRAMUSCULAR | 0 refills | Status: AC

## 2023-10-31 NOTE — Addendum Note (Signed)
 Addended by: WATT RAISIN C on: 10/31/2023 01:10 PM   Modules accepted: Orders

## 2023-10-31 NOTE — Patient Instructions (Signed)

## 2023-11-01 ENCOUNTER — Ambulatory Visit: Payer: Self-pay | Admitting: Cardiology

## 2023-11-01 DIAGNOSIS — I351 Nonrheumatic aortic (valve) insufficiency: Secondary | ICD-10-CM

## 2023-11-01 LAB — COMPREHENSIVE METABOLIC PANEL WITH GFR
ALT: 14 IU/L (ref 0–32)
AST: 22 IU/L (ref 0–40)
Albumin: 4.5 g/dL (ref 3.9–4.9)
Alkaline Phosphatase: 118 IU/L (ref 44–121)
BUN/Creatinine Ratio: 7 — ABNORMAL LOW (ref 12–28)
BUN: 7 mg/dL — ABNORMAL LOW (ref 8–27)
Bilirubin Total: 0.3 mg/dL (ref 0.0–1.2)
CO2: 23 mmol/L (ref 20–29)
Calcium: 10.2 mg/dL (ref 8.7–10.3)
Chloride: 103 mmol/L (ref 96–106)
Creatinine, Ser: 1 mg/dL (ref 0.57–1.00)
Globulin, Total: 2.8 g/dL (ref 1.5–4.5)
Glucose: 100 mg/dL — ABNORMAL HIGH (ref 70–99)
Potassium: 5 mmol/L (ref 3.5–5.2)
Sodium: 140 mmol/L (ref 134–144)
Total Protein: 7.3 g/dL (ref 6.0–8.5)
eGFR: 62 mL/min/1.73 (ref >59)

## 2023-11-01 LAB — LIPID PANEL
Chol/HDL Ratio: 2.6 ratio (ref 0.0–4.4)
Cholesterol, Total: 157 mg/dL (ref 100–199)
HDL: 61 mg/dL (ref >39)
LDL Chol Calc (NIH): 69 mg/dL (ref 0–99)
Triglycerides: 163 mg/dL — ABNORMAL HIGH (ref 0–149)
VLDL Cholesterol Cal: 27 mg/dL (ref 5–40)

## 2023-11-14 ENCOUNTER — Encounter: Payer: Self-pay | Admitting: Family Medicine

## 2023-11-14 DIAGNOSIS — M25412 Effusion, left shoulder: Secondary | ICD-10-CM

## 2023-11-14 DIAGNOSIS — G8929 Other chronic pain: Secondary | ICD-10-CM

## 2023-11-14 DIAGNOSIS — M25562 Pain in left knee: Secondary | ICD-10-CM

## 2023-11-15 MED ORDER — HYDROCODONE-ACETAMINOPHEN 5-325 MG PO TABS: 1.0000 | ORAL_TABLET | Freq: Two times a day (BID) | ORAL | 0 refills | Status: DC | PRN

## 2023-11-21 DIAGNOSIS — M1990 Unspecified osteoarthritis, unspecified site: Secondary | ICD-10-CM | POA: Diagnosis not present

## 2023-11-21 DIAGNOSIS — J449 Chronic obstructive pulmonary disease, unspecified: Secondary | ICD-10-CM | POA: Diagnosis not present

## 2023-11-21 DIAGNOSIS — G8929 Other chronic pain: Secondary | ICD-10-CM | POA: Diagnosis not present

## 2023-12-08 ENCOUNTER — Ambulatory Visit (HOSPITAL_COMMUNITY)
Admission: RE | Admit: 2023-12-08 | Discharge: 2023-12-08 | Disposition: A | Discharge status: Home / Self Care | Source: Ambulatory Visit | Attending: Cardiology | Admitting: Cardiology

## 2023-12-08 DIAGNOSIS — I351 Nonrheumatic aortic (valve) insufficiency: Secondary | ICD-10-CM | POA: Diagnosis not present

## 2023-12-08 LAB — ECHOCARDIOGRAM COMPLETE
AV Vena cont: 0.3 cm
Area-P 1/2: 3.19 cm2
P 1/2 time: 381 ms
S' Lateral: 3.6 cm

## 2023-12-13 ENCOUNTER — Encounter: Payer: Self-pay | Admitting: Family Medicine

## 2023-12-13 DIAGNOSIS — G8929 Other chronic pain: Secondary | ICD-10-CM

## 2023-12-13 DIAGNOSIS — M25562 Pain in left knee: Secondary | ICD-10-CM

## 2023-12-13 DIAGNOSIS — M25512 Pain in left shoulder: Secondary | ICD-10-CM

## 2023-12-14 MED ORDER — HYDROCODONE-ACETAMINOPHEN 5-325 MG PO TABS: 1.0000 | ORAL_TABLET | Freq: Two times a day (BID) | ORAL | 0 refills | Status: DC | PRN

## 2023-12-22 DIAGNOSIS — M1990 Unspecified osteoarthritis, unspecified site: Secondary | ICD-10-CM | POA: Diagnosis not present

## 2023-12-22 DIAGNOSIS — G8929 Other chronic pain: Secondary | ICD-10-CM | POA: Diagnosis not present

## 2024-01-02 DIAGNOSIS — Z79891 Long term (current) use of opiate analgesic: Secondary | ICD-10-CM | POA: Diagnosis not present

## 2024-01-02 DIAGNOSIS — C50919 Malignant neoplasm of unspecified site of unspecified female breast: Secondary | ICD-10-CM | POA: Diagnosis not present

## 2024-01-02 DIAGNOSIS — N182 Chronic kidney disease, stage 2 (mild): Secondary | ICD-10-CM | POA: Diagnosis not present

## 2024-01-02 DIAGNOSIS — M8580 Other specified disorders of bone density and structure, unspecified site: Secondary | ICD-10-CM | POA: Diagnosis not present

## 2024-01-02 DIAGNOSIS — M1990 Unspecified osteoarthritis, unspecified site: Secondary | ICD-10-CM | POA: Diagnosis not present

## 2024-01-02 DIAGNOSIS — F419 Anxiety disorder, unspecified: Secondary | ICD-10-CM | POA: Diagnosis not present

## 2024-01-02 DIAGNOSIS — Z7982 Long term (current) use of aspirin: Secondary | ICD-10-CM | POA: Diagnosis not present

## 2024-01-02 DIAGNOSIS — E785 Hyperlipidemia, unspecified: Secondary | ICD-10-CM | POA: Diagnosis not present

## 2024-01-02 DIAGNOSIS — I951 Orthostatic hypotension: Secondary | ICD-10-CM | POA: Diagnosis not present

## 2024-01-14 ENCOUNTER — Encounter: Payer: Self-pay | Admitting: Family Medicine

## 2024-01-14 DIAGNOSIS — M25562 Pain in left knee: Secondary | ICD-10-CM

## 2024-01-14 DIAGNOSIS — G8929 Other chronic pain: Secondary | ICD-10-CM

## 2024-01-14 DIAGNOSIS — M25512 Pain in left shoulder: Secondary | ICD-10-CM

## 2024-01-14 MED ORDER — HYDROCODONE-ACETAMINOPHEN 5-325 MG PO TABS: 1.0000 | ORAL_TABLET | Freq: Two times a day (BID) | ORAL | 0 refills | Status: DC | PRN

## 2024-01-20 ENCOUNTER — Encounter: Payer: Self-pay | Admitting: Family Medicine

## 2024-01-20 DIAGNOSIS — Z111 Encounter for screening for respiratory tuberculosis: Secondary | ICD-10-CM

## 2024-01-21 DIAGNOSIS — G8929 Other chronic pain: Secondary | ICD-10-CM | POA: Diagnosis not present

## 2024-01-21 DIAGNOSIS — M1990 Unspecified osteoarthritis, unspecified site: Secondary | ICD-10-CM | POA: Diagnosis not present

## 2024-01-21 DIAGNOSIS — J449 Chronic obstructive pulmonary disease, unspecified: Secondary | ICD-10-CM | POA: Diagnosis not present

## 2024-01-21 NOTE — Telephone Encounter (Signed)

## 2024-01-26 ENCOUNTER — Encounter: Payer: Self-pay | Admitting: Family Medicine

## 2024-01-26 DIAGNOSIS — G8929 Other chronic pain: Secondary | ICD-10-CM

## 2024-01-26 DIAGNOSIS — M25562 Pain in left knee: Secondary | ICD-10-CM

## 2024-01-26 MED ORDER — TRAMADOL HCL 50 MG PO TABS: ORAL_TABLET | ORAL | 2 refills | Status: AC

## 2024-01-28 NOTE — Progress Notes (Signed)
 [facility] [address] [place], [place] [address] [phone]  Date:  01/29/2024   Name:  Casey Huerta   DOB:  1955/05/16   MRN:  [id number]  PCP:  Watt Harlene BROCKS, MD    Chief Complaint: Annual Exam (Not Fasting/Mammogram- already done/Flu shot already done )   History of Present Illness:  Casey Huerta is a 67 y.o. very pleasant female patient who presents with the following:  Patient seen today with concern about stomach problems.  I saw her most recently in April History of DM, chronic pain, breast cancer, pre-diabetes, CAD- history of CABG 2001 Dx with breast cancer 2023. She is taking letrozole  for 7 years.  She follows up with hematology annually I am treating her chronic joint pain with Celebrex , and either hydrocodone  or tramadol  depending on severity of pain She has known significant knee arthritis;?  Would she would like to consider orthopedic consult at this time  She saw her cardiologist, Dr. Pietro in [place] set her up for an echo to evaluate aortic insufficiency.  Otherwise no changes at that time  She saw her oncologist, Dr. Odean in May-doing fine, no changes Labs on chart from September-CMP, lipid  UDS is on chart from March, can update today Can update bone density scan Mammogram is current Cologuard due in January Pap 2023, normal  Discussed the use of AI scribe software for clinical note transcription with the patient, who gave verbal consent to proceed.  History of Present Illness   She notes intermittent RLQ pain - thought it might be constipation but it bothers her even if she had a BM It has hurt for 2-3 weeks Will wax and wane  No vomiting or diarrhea No fever, eating normally  Her right ear hurts sometimes over the last couple of weeks   The abdominal pain bothered her last yesterday -she feels okay today No urinary symptoms, no vaginal symptoms or  discharge  Discussed her knee pain-she has had what sounds like arthroscopic surgery in the past, she was told the next that would be a knee replacement.  Right now she is not really interested in looking for other, she does not want to do surgery  She last filled alprazolam  about 8 months ago, she would like a refill of this medication if possible   Patient Active Problem List   Diagnosis Date Noted   Family history of prostate cancer 05/23/2021   Family history of ovarian cancer 05/23/2021   Family history of leukemia 05/23/2021   Malignant neoplasm of upper-outer quadrant of right breast in female, estrogen receptor positive (HCC) 05/03/2021   Prediabetes 02/28/2020   Osteoarthritis of both knees 06/14/2016   Breast mass 05/17/2016   Aortic insufficiency 03/06/2012   Dyslipidemia 06/19/2007   Essential hypertension 06/19/2007   Hx of CABG 06/19/2007    Past Medical History:  Diagnosis Date   Aortic insufficiency    a. Mild-mod AI by echo 2010.   Arthritis    Breast cancer (HCC) 04/25/2021   Breast mass 05/17/2016   BIRADS 3: biopsy revealed benign fibroadenoma   COPD    CORONARY ARTERY DISEASE    a. CABG (LIMA-->LAD 2001). b. Myoview 2008 no ischemia, EF 63%.  c. cath 10/14/2013 mild 20% ost LAD, atretic LIMA, EF 55%, medical therapy   Family history of leukemia    Family history of ovarian cancer    Family history of prostate cancer  Hematemesis 09/2018   Hyperlipidemia    MENORRHAGIA    Unspecified essential hypertension     Past Surgical History:  Procedure Laterality Date   arthroscopic knee surgery     BIOPSY  09/23/2018   Procedure: BIOPSY;  Surgeon: Kristie Lamprey, MD;  Location: [facility] ENDOSCOPY;  Service: Endoscopy;;   BREAST BIOPSY Right 05/28/2016   HYALINIZED FIBROADENOMA   BREAST LUMPECTOMY Right 06/06/2021   BREAST LUMPECTOMY WITH RADIOACTIVE SEED AND SENTINEL LYMPH NODE BIOPSY Right 06/06/2021   Procedure: RIGHT BREAST LUMPECTOMY WITH RADIOACTIVE SEED  AND SENTINEL LYMPH NODE BIOPSY;  Surgeon: Curvin Deward MOULD, MD;  Location: [facility];  Service: General;  Laterality: Right;   CESAREAN SECTION     CORONARY ARTERY BYPASS GRAFT  2001   ESOPHAGOGASTRODUODENOSCOPY (EGD) WITH PROPOFOL  Left 09/23/2018   Procedure: ESOPHAGOGASTRODUODENOSCOPY (EGD) WITH PROPOFOL ;  Surgeon: Kristie Lamprey, MD;  Location: [facility] ENDOSCOPY;  Service: Endoscopy;  Laterality: Left;   LEFT HEART CATH AND CORONARY ANGIOGRAPHY N/A 01/17/2017   Procedure: LEFT HEART CATH AND CORONARY ANGIOGRAPHY;  Surgeon: Claudene Victory ORN, MD;  Location: [facility] INVASIVE CV LAB;  Service: Cardiovascular;  Laterality: N/A;   LEFT HEART CATHETERIZATION WITH CORONARY/GRAFT ANGIOGRAM N/A 10/12/2013   Procedure: LEFT HEART CATHETERIZATION WITH EL BILE;  Surgeon: Debby DELENA Sor, MD;  Location: [facility] CATH LAB;  Service: Cardiovascular;  Laterality: N/A;    Social History   Tobacco Use   Smoking status: Former    Current packs/day: 0.00    Types: Cigarettes    Quit date: 1980    Years since quitting: 45.9   Smokeless tobacco: Never   Tobacco comments:    QUIT SMOKING IN 1999  Vaping Use   Vaping status: Never Used  Substance Use Topics   Alcohol use: Yes    Comment: friday nights - 2 glasses - rare   Drug use: No    Comment:  quit using in 1985    Family History  Problem Relation Age of Onset   Heart disease Mother    Cancer Mother        possibly ovarian cancer   Lung cancer Father    Heart disease Sister    Ovarian cancer Sister    Cancer - Prostate Brother 22   Leukemia Niece        great niece died of leukemia at 68    Allergies  Allergen Reactions   Simvastatin Other (See Comments)    REACTION: constipation    Medication list has been reviewed and updated.  Current Outpatient Medications on File Prior to Visit  Medication Sig Dispense Refill   amLODipine  (NORVASC ) 5 MG tablet Take 1 tablet (5 mg total) by mouth daily. 90 tablet 3   aspirin  (EQ  ASPIRIN  LOW DOSE) 81 MG chewable tablet Chew 1 tablet (81 mg total) by mouth daily. 90 tablet 0   atenolol (TENORMIN) 12.5 mg TABS tablet Atenolol     celecoxib  (CELEBREX ) 100 MG capsule TAKE 1 CAPSULE BY MOUTH TWICE DAILY AS NEEDED FOR JOINT PAIN 180 capsule 3   HYDROcodone -acetaminophen  (NORCO/VICODIN) 5-325 MG tablet Take 1 tablet by mouth every 12 (twelve) hours as needed for up to 30 doses for severe pain (pain score 7-10) or moderate pain (pain score 4-6). 30 tablet 0   letrozole  (FEMARA ) 2.5 MG tablet Take 1 tablet (2.5 mg total) by mouth daily. 90 tablet 3   Multiple Vitamins-Minerals (MULTIVITAMIN WITH MINERALS) tablet Take 1 tablet by mouth daily.     nitroGLYCERIN  (NITROSTAT )  0.4 MG SL tablet Place 1 tablet (0.4 mg total) under the tongue every 5 (five) minutes x 3 doses as needed for chest pain. 25 tablet 1   ondansetron  (ZOFRAN -ODT) 4 MG disintegrating tablet Take 4 mg by mouth every 8 (eight) hours as needed.     rosuvastatin  (CRESTOR ) 20 MG tablet Take 1 tablet (20 mg total) by mouth daily. 90 tablet 3   traMADol  (ULTRAM ) 50 MG tablet TAKE 1 TABLET BY MOUTH ONCE DAILY AT BEDTIME DO  NOT  MIX  WITH  XANAX  30 tablet 2   naloxone  (NARCAN ) nasal spray 4 mg/0.1 mL One spray in nostril as needed for overdose - repeat in 4 minutes as needed until help arrives (Patient not taking: Reported on 01/29/2024) 1 each PRN   No current facility-administered medications on file prior to visit.    Review of Systems:  As per HPI- otherwise negative.   Physical Examination: Vitals:   01/29/24 1419  BP: 130/86  Pulse: 86  Resp: 16  Temp: 98.1 F (36.7 C)  SpO2: 97%   Vitals:   01/29/24 1419  Weight: 137 lb 4 oz (62.3 kg)  Height: 5' 2 (1.575 m)   Body mass index is 25.1 kg/m. Ideal Body Weight: Weight in (lb) to have BMI = 25: 136.4  GEN: no acute distress. Normal weight, looks well  HEENT: Atraumatic, Normocephalic. Bilateral TM wnl, oropharynx normal.  PEERL,EOMI.   Ears and  Nose: No external deformity. CV: RRR, No M/G/R. No JVD. No thrill. No extra heart sounds. PULM: CTA B, no wheezes, crackles, rhonchi. No retractions. No resp. distress. No accessory muscle use. ABD: S, NT, ND, +BS. No rebound. No HSM.  Belly is benign  EXTR: No c/c/e PSYCH: Normally interactive. Conversant.    Assessment and Plan: Prediabetes - Plan: Basic metabolic panel with GFR, Hemoglobin A1c  Osteoarthritis of both knees, unspecified osteoarthritis type  Essential hypertension - Plan: Basic metabolic panel with GFR, CBC  Dyslipidemia - Plan: Lipid panel  Hx of CABG - Plan: DRUG MONITORING, PANEL 8 WITH CONFIRMATION, URINE  Estrogen deficiency - Plan: DG Bone Density  Chronic RLQ pain - Plan: CT ABDOMEN PELVIS W CONTRAST, POCT urinalysis dipstick, Urine Culture  Stress reaction - Plan: ALPRAZolam  (XANAX ) 0.25 MG tablet Patient seen today for follow-up.  Blood work is pending as above She has noticed some right lower quadrant pain, intermittent for the last 2 weeks.  Her exam is benign.  We discussed doing a CT scan, she is not sure if she wants to go through with this.  We decided to go ahead and order the scan and then await her labs and see how she does the next day or so.  If labs look good and her symptoms do not come back we can cancel the scan  Follow-up on prediabetes, blood pressure under good control, follow-up on lipids Ordered bone density scan Signed Harlene Schroeder, MD  Addendum 12/12, received labs as below.  Message to patient. Results for orders placed or performed in visit on 01/29/24  Basic metabolic panel with GFR   Collection Time: 01/29/24  2:44 PM  Result Value Ref Range   Sodium 142 135 - 145 mEq/L   Potassium 5.0 3.5 - 5.1 mEq/L   Chloride 104 96 - 112 mEq/L   CO2 28 19 - 32 mEq/L   Glucose, Bld 105 (H) 70 - 99 mg/dL   BUN 11 6 - 23 mg/dL   Creatinine, Ser 8.98 0.40 - 1.20 mg/dL  GFR 57.68 (L) >60.00 mL/min   Calcium  10.1 8.4 - 10.5 mg/dL   Hemoglobin J8r   Collection Time: 01/29/24  2:44 PM  Result Value Ref Range   Hgb A1c MFr Bld 5.7 4.6 - 6.5 %  Lipid panel   Collection Time: 01/29/24  2:44 PM  Result Value Ref Range   Cholesterol 127 0 - 200 mg/dL   Triglycerides 847.9 (H) 0.0 - 149.0 mg/dL   HDL 32.79 >60.99 mg/dL   VLDL 69.5 0.0 - 59.9 mg/dL   LDL Cholesterol 30 0 - 99 mg/dL   Total CHOL/HDL Ratio 2    NonHDL 60.19   CBC   Collection Time: 01/29/24  2:44 PM  Result Value Ref Range   WBC 4.7 4.0 - 10.5 K/uL   RBC 4.52 3.87 - 5.11 Mil/uL   Platelets 239.0 150.0 - 400.0 K/uL   Hemoglobin 13.8 12.0 - 15.0 g/dL   HCT 58.9 63.9 - 53.9 %   MCV 90.7 78.0 - 100.0 fl   MCHC 33.7 30.0 - 36.0 g/dL   RDW 86.6 88.4 - 84.4 %  DRUG MONITORING, PANEL 8 WITH CONFIRMATION, URINE   Collection Time: 01/29/24  2:44 PM  Result Value Ref Range   Alcohol Metabolites NEGATIVE <500 ng/mL   Amphetamines NEGATIVE <500 ng/mL   Benzodiazepines NEGATIVE <100 ng/mL   Buprenorphine, Urine NEGATIVE <5 ng/mL   Cocaine Metabolite NEGATIVE <150 ng/mL   6 Acetylmorphine NEGATIVE <10 ng/mL   Marijuana Metabolite NEGATIVE <20 ng/mL   MDMA NEGATIVE <500 ng/mL   Opiates POSITIVE (A) <100 ng/mL   Codeine NEGATIVE <50 ng/mL   Hydrocodone  NEGATIVE <50 ng/mL   Hydromorphone  52 (H) <50 ng/mL   Morphine NEGATIVE <50 ng/mL   Norhydrocodone 54 (H) <50 ng/mL   Opiates Comments     Oxycodone  NEGATIVE <100 ng/mL   Creatinine 45.0 > or = 20.0 mg/dL   pH 7.9 4.5 - 9.0   Oxidant NEGATIVE <200 mcg/mL  DM TEMPLATE   Collection Time: 01/29/24  2:44 PM  Result Value Ref Range   Notes and Comments    POCT urinalysis dipstick   Collection Time: 01/29/24  2:56 PM  Result Value Ref Range   Color, UA yellow yellow   Clarity, UA cloudy (A) clear   Glucose, UA negative negative mg/dL   Bilirubin, UA negative negative   Ketones, POC UA negative negative mg/dL   Spec Grav, UA <=8.994 (A) 1.010 - 1.025   Blood, UA negative negative   pH, UA 7.5 5.0 -  8.0   Protein Ur, POC negative negative mg/dL   Urobilinogen, UA 0.2 0.2 or 1.0 E.U./dL   Nitrite, UA Negative Negative   Leukocytes, UA Small (1+) (A) Negative  Urine Culture   Collection Time: 01/29/24  3:02 PM   Specimen: Urine  Result Value Ref Range   MICRO NUMBER: [id number]    SPECIMEN QUALITY: Adequate    Sample Source URINE    STATUS: FINAL    ISOLATE 1: Escherichia coli (A)       Susceptibility   Escherichia coli - URINE CULTURE, REFLEX    AMOX/CLAVULANIC <=2 Sensitive     AMPICILLIN/SULBACTAM <=2 Sensitive     CEFAZOLIN * <=1 Not Reportable      * For infections other than uncomplicated UTI caused by E. coli, K. pneumoniae or P. mirabilis: Cefazolin  is resistant if MIC > or = 8 mcg/mL. (Distinguishing susceptible versus intermediate for isolates with MIC < or = 4 mcg/mL requires additional testing.) For uncomplicated  UTI caused by E. coli, K. pneumoniae or P. mirabilis: Cefazolin  is susceptible if MIC <32 mcg/mL and predicts susceptible to the oral agents cefaclor, cefdinir, cefpodoxime, cefprozil, cefuroxime, cephalexin  and loracarbef.     CEFTAZIDIME <=0.5 Sensitive     CEFEPIME <=0.12 Sensitive     CEFTRIAXONE <=0.25 Sensitive     CIPROFLOXACIN <=0.06 Sensitive     LEVOFLOXACIN <=0.12 Sensitive     GENTAMICIN <=1 Sensitive     IMIPENEM <=0.25 Sensitive     MEROPENEM <=0.25 Sensitive     NITROFURANTOIN <=16 Sensitive     PIP/TAZO <=4 Sensitive     TRIMETH/SULFA* <=20 Sensitive      * For infections other than uncomplicated UTI caused by E. coli, K. pneumoniae or P. mirabilis: Cefazolin  is resistant if MIC > or = 8 mcg/mL. (Distinguishing susceptible versus intermediate for isolates with MIC < or = 4 mcg/mL requires additional testing.) For uncomplicated UTI caused by E. coli, K. pneumoniae or P. mirabilis: Cefazolin  is susceptible if MIC <32 mcg/mL and predicts susceptible to the oral agents cefaclor, cefdinir, cefpodoxime, cefprozil, cefuroxime,  cephalexin  and loracarbef. Legend: S = Susceptible  I = Intermediate R = Resistant  NS = Not susceptible SDD = Susceptible Dose Dependent * = Not Tested  NR = Not Reported **NN = See Therapy Comments    Received urine culture 12/14- message to pt

## 2024-01-28 NOTE — Patient Instructions (Addendum)
 Good to see you today- I will be in touch with your labs  We will plan to do a CT scan of your belly to check on the pain you have noticed- however if your labs are normal and the pain does not come back we can consider cancelling the scan   I ordered a bone density scan for you to be completed here at our imaging dept

## 2024-01-29 ENCOUNTER — Ambulatory Visit (INDEPENDENT_AMBULATORY_CARE_PROVIDER_SITE_OTHER): Admitting: Family Medicine

## 2024-01-29 ENCOUNTER — Encounter: Payer: Self-pay | Admitting: Family Medicine

## 2024-01-29 VITALS — BP 130/86 | HR 86 | Temp 98.1°F | Resp 16 | Ht 62.0 in | Wt 137.2 lb

## 2024-01-29 DIAGNOSIS — N3000 Acute cystitis without hematuria: Secondary | ICD-10-CM | POA: Diagnosis not present

## 2024-01-29 DIAGNOSIS — G8929 Other chronic pain: Secondary | ICD-10-CM | POA: Diagnosis not present

## 2024-01-29 DIAGNOSIS — I10 Essential (primary) hypertension: Secondary | ICD-10-CM | POA: Diagnosis not present

## 2024-01-29 DIAGNOSIS — R7303 Prediabetes: Secondary | ICD-10-CM | POA: Diagnosis not present

## 2024-01-29 DIAGNOSIS — R1031 Right lower quadrant pain: Secondary | ICD-10-CM | POA: Diagnosis not present

## 2024-01-29 DIAGNOSIS — M170 Bilateral primary osteoarthritis of knee: Secondary | ICD-10-CM

## 2024-01-29 DIAGNOSIS — Z951 Presence of aortocoronary bypass graft: Secondary | ICD-10-CM

## 2024-01-29 DIAGNOSIS — F430 Acute stress reaction: Secondary | ICD-10-CM

## 2024-01-29 DIAGNOSIS — E785 Hyperlipidemia, unspecified: Secondary | ICD-10-CM

## 2024-01-29 DIAGNOSIS — E2839 Other primary ovarian failure: Secondary | ICD-10-CM

## 2024-01-29 LAB — POCT URINALYSIS DIP (MANUAL ENTRY)
Bilirubin, UA: NEGATIVE
Blood, UA: NEGATIVE
Glucose, UA: NEGATIVE mg/dL
Ketones, POC UA: NEGATIVE mg/dL
Nitrite, UA: NEGATIVE
Protein Ur, POC: NEGATIVE mg/dL
Spec Grav, UA: <=1.005 — AB (ref 1.010–1.025)
Urobilinogen, UA: 0.2 U/dL
pH, UA: 7.5 (ref 5.0–8.0)

## 2024-01-29 MED ORDER — ALPRAZOLAM 0.25 MG PO TABS: 0.2500 mg | ORAL_TABLET | Freq: Every day | ORAL | 0 refills | Status: AC | PRN

## 2024-01-30 ENCOUNTER — Encounter: Payer: Self-pay | Admitting: Family Medicine

## 2024-01-30 LAB — LIPID PANEL
Cholesterol: 127 mg/dL (ref 0–200)
HDL: 67.2 mg/dL (ref >39.00)
LDL Cholesterol: 30 mg/dL (ref 0–99)
NonHDL: 60.19
Total CHOL/HDL Ratio: 2
Triglycerides: 152 mg/dL — ABNORMAL HIGH (ref 0.0–149.0)
VLDL: 30.4 mg/dL (ref 0.0–40.0)

## 2024-01-30 LAB — HEMOGLOBIN A1C: Hgb A1c MFr Bld: 5.7 % (ref 4.6–6.5)

## 2024-01-30 LAB — BASIC METABOLIC PANEL WITH GFR
BUN: 11 mg/dL (ref 6–23)
CO2: 28 meq/L (ref 19–32)
Calcium: 10.1 mg/dL (ref 8.4–10.5)
Chloride: 104 meq/L (ref 96–112)
Creatinine, Ser: 1.01 mg/dL (ref 0.40–1.20)
GFR: 57.68 mL/min — ABNORMAL LOW (ref >60.00)
Glucose, Bld: 105 mg/dL — ABNORMAL HIGH (ref 70–99)
Potassium: 5 meq/L (ref 3.5–5.1)
Sodium: 142 meq/L (ref 135–145)

## 2024-01-30 LAB — CBC
HCT: 41 % (ref 36.0–46.0)
Hemoglobin: 13.8 g/dL (ref 12.0–15.0)
MCHC: 33.7 g/dL (ref 30.0–36.0)
MCV: 90.7 fl (ref 78.0–100.0)
Platelets: 239 K/uL (ref 150.0–400.0)
RBC: 4.52 Mil/uL (ref 3.87–5.11)
RDW: 13.3 % (ref 11.5–15.5)
WBC: 4.7 K/uL (ref 4.0–10.5)

## 2024-01-31 LAB — DRUG MONITORING, PANEL 8 WITH CONFIRMATION, URINE
6 Acetylmorphine: NEGATIVE ng/mL (ref <10)
Alcohol Metabolites: NEGATIVE ng/mL (ref <500)
Amphetamines: NEGATIVE ng/mL (ref <500)
Benzodiazepines: NEGATIVE ng/mL (ref <100)
Buprenorphine, Urine: NEGATIVE ng/mL (ref <5)
Cocaine Metabolite: NEGATIVE ng/mL (ref <150)
Codeine: NEGATIVE ng/mL (ref <50)
Creatinine: 45 mg/dL (ref >=20.0)
Hydrocodone: NEGATIVE ng/mL (ref <50)
Hydromorphone: 52 ng/mL — ABNORMAL HIGH (ref <50)
MDMA: NEGATIVE ng/mL (ref <500)
Marijuana Metabolite: NEGATIVE ng/mL (ref <20)
Morphine: NEGATIVE ng/mL (ref <50)
Norhydrocodone: 54 ng/mL — ABNORMAL HIGH (ref <50)
Opiates: POSITIVE ng/mL — AB (ref <100)
Oxidant: NEGATIVE ug/mL (ref <200)
Oxycodone: NEGATIVE ng/mL (ref <100)
pH: 7.9 (ref 4.5–9.0)

## 2024-01-31 LAB — DM TEMPLATE

## 2024-02-01 ENCOUNTER — Encounter: Payer: Self-pay | Admitting: Family Medicine

## 2024-02-01 LAB — URINE CULTURE
MICRO NUMBER:: 17343922
SPECIMEN QUALITY:: ADEQUATE

## 2024-02-01 MED ORDER — CEPHALEXIN 500 MG PO CAPS: 500.0000 mg | ORAL_CAPSULE | Freq: Two times a day (BID) | ORAL | 0 refills | Status: AC

## 2024-02-01 NOTE — Addendum Note (Signed)
 Addended by: WATT RAISIN C on: 02/01/2024 02:19 PM   Modules accepted: Orders

## 2024-02-03 ENCOUNTER — Encounter (HOSPITAL_BASED_OUTPATIENT_CLINIC_OR_DEPARTMENT_OTHER): Payer: Self-pay

## 2024-02-03 ENCOUNTER — Encounter: Payer: Self-pay | Admitting: Family Medicine

## 2024-02-03 ENCOUNTER — Ambulatory Visit (HOSPITAL_BASED_OUTPATIENT_CLINIC_OR_DEPARTMENT_OTHER): Admission: RE | Admit: 2024-02-03 | Discharge: 2024-02-03 | Attending: Family Medicine | Admitting: Family Medicine

## 2024-02-03 DIAGNOSIS — G8929 Other chronic pain: Secondary | ICD-10-CM

## 2024-02-03 DIAGNOSIS — R1031 Right lower quadrant pain: Secondary | ICD-10-CM | POA: Diagnosis present

## 2024-02-03 MED ORDER — IOHEXOL 300 MG/ML  SOLN
100.0000 mL | Freq: Once | INTRAMUSCULAR | Status: AC | PRN
  Administered 2024-02-03: 15:00:00 100 mL via INTRAVENOUS

## 2024-02-13 ENCOUNTER — Encounter: Payer: Self-pay | Admitting: Family Medicine

## 2024-02-13 DIAGNOSIS — M25562 Pain in left knee: Secondary | ICD-10-CM

## 2024-02-13 DIAGNOSIS — M25412 Effusion, left shoulder: Secondary | ICD-10-CM

## 2024-02-13 DIAGNOSIS — M545 Low back pain, unspecified: Secondary | ICD-10-CM

## 2024-02-13 MED ORDER — HYDROCODONE-ACETAMINOPHEN 5-325 MG PO TABS: 1.0000 | ORAL_TABLET | Freq: Two times a day (BID) | ORAL | 0 refills | Status: DC | PRN

## 2024-02-26 ENCOUNTER — Encounter: Payer: Self-pay | Admitting: Family Medicine

## 2024-02-26 ENCOUNTER — Other Ambulatory Visit: Payer: Self-pay | Admitting: Family Medicine

## 2024-02-26 DIAGNOSIS — Z9889 Other specified postprocedural states: Secondary | ICD-10-CM

## 2024-02-26 DIAGNOSIS — Z853 Personal history of malignant neoplasm of breast: Secondary | ICD-10-CM

## 2024-03-03 ENCOUNTER — Ambulatory Visit (HOSPITAL_BASED_OUTPATIENT_CLINIC_OR_DEPARTMENT_OTHER)
Admission: RE | Admit: 2024-03-03 | Discharge: 2024-03-03 | Disposition: A | Discharge status: Home / Self Care | Source: Ambulatory Visit | Attending: Family Medicine | Admitting: Family Medicine

## 2024-03-03 DIAGNOSIS — E2839 Other primary ovarian failure: Secondary | ICD-10-CM | POA: Diagnosis present

## 2024-03-04 ENCOUNTER — Encounter: Payer: Self-pay | Admitting: Family Medicine

## 2024-03-04 DIAGNOSIS — M810 Age-related osteoporosis without current pathological fracture: Secondary | ICD-10-CM

## 2024-03-13 ENCOUNTER — Encounter: Payer: Self-pay | Admitting: Family Medicine

## 2024-03-13 DIAGNOSIS — M25562 Pain in left knee: Secondary | ICD-10-CM

## 2024-03-13 DIAGNOSIS — M545 Low back pain, unspecified: Secondary | ICD-10-CM

## 2024-03-13 DIAGNOSIS — M25412 Effusion, left shoulder: Secondary | ICD-10-CM

## 2024-03-14 ENCOUNTER — Encounter: Payer: Self-pay | Admitting: Family Medicine

## 2024-03-14 MED ORDER — HYDROCODONE-ACETAMINOPHEN 5-325 MG PO TABS: 1.0000 | ORAL_TABLET | Freq: Two times a day (BID) | ORAL | 0 refills | Status: AC | PRN

## 2024-03-15 MED ORDER — ALENDRONATE SODIUM 70 MG PO TABS: 70.0000 mg | ORAL_TABLET | ORAL | 11 refills | Status: AC

## 2024-03-25 ENCOUNTER — Ambulatory Visit
Admission: RE | Admit: 2024-03-25 | Discharge: 2024-03-25 | Disposition: A | Source: Ambulatory Visit | Attending: Family Medicine | Admitting: Family Medicine

## 2024-03-25 DIAGNOSIS — Z853 Personal history of malignant neoplasm of breast: Secondary | ICD-10-CM

## 2024-03-25 DIAGNOSIS — Z9889 Other specified postprocedural states: Secondary | ICD-10-CM

## 2024-06-29 ENCOUNTER — Ambulatory Visit: Admitting: Hematology and Oncology

## 2024-07-21 ENCOUNTER — Ambulatory Visit
